# Patient Record
Sex: Female | Born: 1940 | Race: White | Hispanic: No | Smoking: Never smoker
Health system: Southern US, Community
[De-identification: ages and names within clinical notes are randomized; demographics above are authoritative.]

## PROBLEM LIST (undated history)

## (undated) DIAGNOSIS — G459 Transient cerebral ischemic attack, unspecified: Secondary | ICD-10-CM

## (undated) DIAGNOSIS — D649 Anemia, unspecified: Secondary | ICD-10-CM

## (undated) DIAGNOSIS — K219 Gastro-esophageal reflux disease without esophagitis: Secondary | ICD-10-CM

## (undated) DIAGNOSIS — C801 Malignant (primary) neoplasm, unspecified: Secondary | ICD-10-CM

## (undated) DIAGNOSIS — D509 Iron deficiency anemia, unspecified: Secondary | ICD-10-CM

## (undated) DIAGNOSIS — R12 Heartburn: Secondary | ICD-10-CM

## (undated) DIAGNOSIS — N200 Calculus of kidney: Secondary | ICD-10-CM

## (undated) DIAGNOSIS — K921 Melena: Secondary | ICD-10-CM

## (undated) DIAGNOSIS — Z09 Encounter for follow-up examination after completed treatment for conditions other than malignant neoplasm: Secondary | ICD-10-CM

## (undated) DIAGNOSIS — K819 Cholecystitis, unspecified: Secondary | ICD-10-CM

## (undated) HISTORY — DX: Gastro-esophageal reflux disease without esophagitis: K21.9

## (undated) HISTORY — DX: Encounter for follow-up examination after completed treatment for conditions other than malignant neoplasm: Z09

## (undated) HISTORY — PX: CATARACT EXTRACTION: SUR2

## (undated) HISTORY — DX: Melena: K92.1

## (undated) HISTORY — DX: Cholecystitis, unspecified: K81.9

## (undated) HISTORY — DX: Heartburn: R12

## (undated) HISTORY — DX: Transient cerebral ischemic attack, unspecified: G45.9

## (undated) HISTORY — PX: CHOLECYSTECTOMY: SHX55

## (undated) HISTORY — DX: Malignant (primary) neoplasm, unspecified: C80.1

## (undated) HISTORY — DX: Anemia, unspecified: D64.9

## (undated) HISTORY — DX: Calculus of kidney: N20.0

## (undated) HISTORY — DX: Iron deficiency anemia, unspecified: D50.9

---

## 2009-03-01 HISTORY — PX: SPHINCTEROTOMY: SHX5279

## 2014-12-19 DIAGNOSIS — N200 Calculus of kidney: Secondary | ICD-10-CM | POA: Diagnosis not present

## 2014-12-19 DIAGNOSIS — R1012 Left upper quadrant pain: Secondary | ICD-10-CM | POA: Diagnosis not present

## 2014-12-19 DIAGNOSIS — N3090 Cystitis, unspecified without hematuria: Secondary | ICD-10-CM | POA: Diagnosis not present

## 2014-12-19 DIAGNOSIS — N201 Calculus of ureter: Secondary | ICD-10-CM | POA: Diagnosis not present

## 2015-03-02 HISTORY — PX: HEMICOLECTOMY: SHX854

## 2015-05-08 DIAGNOSIS — J208 Acute bronchitis due to other specified organisms: Secondary | ICD-10-CM | POA: Diagnosis not present

## 2015-05-08 DIAGNOSIS — R0602 Shortness of breath: Secondary | ICD-10-CM | POA: Diagnosis not present

## 2015-05-08 DIAGNOSIS — R05 Cough: Secondary | ICD-10-CM | POA: Diagnosis not present

## 2015-05-08 DIAGNOSIS — J9811 Atelectasis: Secondary | ICD-10-CM | POA: Diagnosis not present

## 2015-05-29 DIAGNOSIS — R05 Cough: Secondary | ICD-10-CM | POA: Diagnosis not present

## 2015-05-29 DIAGNOSIS — R5383 Other fatigue: Secondary | ICD-10-CM | POA: Diagnosis not present

## 2015-05-29 DIAGNOSIS — K219 Gastro-esophageal reflux disease without esophagitis: Secondary | ICD-10-CM | POA: Diagnosis not present

## 2015-05-29 DIAGNOSIS — D649 Anemia, unspecified: Secondary | ICD-10-CM | POA: Diagnosis not present

## 2015-05-30 DIAGNOSIS — D649 Anemia, unspecified: Secondary | ICD-10-CM | POA: Diagnosis not present

## 2015-06-09 DIAGNOSIS — J208 Acute bronchitis due to other specified organisms: Secondary | ICD-10-CM | POA: Diagnosis not present

## 2015-06-19 DIAGNOSIS — N201 Calculus of ureter: Secondary | ICD-10-CM | POA: Diagnosis not present

## 2015-06-19 DIAGNOSIS — N3090 Cystitis, unspecified without hematuria: Secondary | ICD-10-CM | POA: Diagnosis not present

## 2015-06-24 DIAGNOSIS — R69 Illness, unspecified: Secondary | ICD-10-CM | POA: Diagnosis not present

## 2015-06-27 DIAGNOSIS — R05 Cough: Secondary | ICD-10-CM | POA: Diagnosis not present

## 2015-06-27 DIAGNOSIS — D508 Other iron deficiency anemias: Secondary | ICD-10-CM | POA: Diagnosis not present

## 2015-06-27 DIAGNOSIS — R5383 Other fatigue: Secondary | ICD-10-CM | POA: Diagnosis not present

## 2015-07-08 DIAGNOSIS — K921 Melena: Secondary | ICD-10-CM | POA: Diagnosis not present

## 2015-07-08 DIAGNOSIS — D509 Iron deficiency anemia, unspecified: Secondary | ICD-10-CM | POA: Diagnosis not present

## 2015-07-08 DIAGNOSIS — D539 Nutritional anemia, unspecified: Secondary | ICD-10-CM

## 2015-07-08 DIAGNOSIS — D649 Anemia, unspecified: Secondary | ICD-10-CM

## 2015-07-08 DIAGNOSIS — R12 Heartburn: Secondary | ICD-10-CM | POA: Diagnosis not present

## 2015-07-08 HISTORY — DX: Nutritional anemia, unspecified: D53.9

## 2015-07-08 HISTORY — DX: Heartburn: R12

## 2015-07-08 HISTORY — DX: Melena: K92.1

## 2015-07-08 HISTORY — DX: Anemia, unspecified: D64.9

## 2015-07-17 DIAGNOSIS — C184 Malignant neoplasm of transverse colon: Secondary | ICD-10-CM | POA: Diagnosis not present

## 2015-07-17 DIAGNOSIS — K921 Melena: Secondary | ICD-10-CM | POA: Diagnosis not present

## 2015-07-17 DIAGNOSIS — R12 Heartburn: Secondary | ICD-10-CM | POA: Diagnosis not present

## 2015-07-17 DIAGNOSIS — D509 Iron deficiency anemia, unspecified: Secondary | ICD-10-CM | POA: Diagnosis not present

## 2015-07-17 DIAGNOSIS — K5730 Diverticulosis of large intestine without perforation or abscess without bleeding: Secondary | ICD-10-CM | POA: Diagnosis not present

## 2015-07-18 DIAGNOSIS — Z85038 Personal history of other malignant neoplasm of large intestine: Secondary | ICD-10-CM

## 2015-07-18 HISTORY — DX: Personal history of other malignant neoplasm of large intestine: Z85.038

## 2015-07-22 DIAGNOSIS — C189 Malignant neoplasm of colon, unspecified: Secondary | ICD-10-CM | POA: Diagnosis not present

## 2015-07-22 DIAGNOSIS — C184 Malignant neoplasm of transverse colon: Secondary | ICD-10-CM | POA: Insufficient documentation

## 2015-07-22 HISTORY — DX: Malignant neoplasm of transverse colon: C18.4

## 2015-08-07 DIAGNOSIS — R911 Solitary pulmonary nodule: Secondary | ICD-10-CM | POA: Diagnosis not present

## 2015-08-07 DIAGNOSIS — K8080 Other cholelithiasis without obstruction: Secondary | ICD-10-CM | POA: Diagnosis not present

## 2015-08-07 DIAGNOSIS — K5730 Diverticulosis of large intestine without perforation or abscess without bleeding: Secondary | ICD-10-CM | POA: Diagnosis not present

## 2015-08-07 DIAGNOSIS — C189 Malignant neoplasm of colon, unspecified: Secondary | ICD-10-CM | POA: Diagnosis not present

## 2015-08-08 DIAGNOSIS — K819 Cholecystitis, unspecified: Secondary | ICD-10-CM | POA: Insufficient documentation

## 2015-08-08 HISTORY — DX: Cholecystitis, unspecified: K81.9

## 2015-08-18 DIAGNOSIS — S41131A Puncture wound without foreign body of right upper arm, initial encounter: Secondary | ICD-10-CM | POA: Diagnosis not present

## 2015-08-18 DIAGNOSIS — S41132A Puncture wound without foreign body of left upper arm, initial encounter: Secondary | ICD-10-CM | POA: Diagnosis not present

## 2015-08-19 DIAGNOSIS — K819 Cholecystitis, unspecified: Secondary | ICD-10-CM | POA: Diagnosis not present

## 2015-08-19 DIAGNOSIS — C184 Malignant neoplasm of transverse colon: Secondary | ICD-10-CM | POA: Diagnosis not present

## 2015-08-19 DIAGNOSIS — Z01818 Encounter for other preprocedural examination: Secondary | ICD-10-CM | POA: Diagnosis not present

## 2015-08-20 DIAGNOSIS — C184 Malignant neoplasm of transverse colon: Secondary | ICD-10-CM | POA: Diagnosis not present

## 2015-08-20 DIAGNOSIS — C183 Malignant neoplasm of hepatic flexure: Secondary | ICD-10-CM | POA: Diagnosis not present

## 2015-08-20 DIAGNOSIS — K8010 Calculus of gallbladder with chronic cholecystitis without obstruction: Secondary | ICD-10-CM | POA: Diagnosis not present

## 2015-08-20 DIAGNOSIS — R339 Retention of urine, unspecified: Secondary | ICD-10-CM | POA: Diagnosis not present

## 2015-08-20 DIAGNOSIS — K219 Gastro-esophageal reflux disease without esophagitis: Secondary | ICD-10-CM | POA: Diagnosis not present

## 2015-09-01 DIAGNOSIS — Z09 Encounter for follow-up examination after completed treatment for conditions other than malignant neoplasm: Secondary | ICD-10-CM

## 2015-09-01 HISTORY — DX: Encounter for follow-up examination after completed treatment for conditions other than malignant neoplasm: Z09

## 2015-09-03 DIAGNOSIS — D649 Anemia, unspecified: Secondary | ICD-10-CM | POA: Diagnosis not present

## 2015-09-05 DIAGNOSIS — C187 Malignant neoplasm of sigmoid colon: Secondary | ICD-10-CM | POA: Diagnosis not present

## 2015-09-05 DIAGNOSIS — D508 Other iron deficiency anemias: Secondary | ICD-10-CM | POA: Diagnosis not present

## 2015-09-05 DIAGNOSIS — R498 Other voice and resonance disorders: Secondary | ICD-10-CM | POA: Diagnosis not present

## 2015-09-09 DIAGNOSIS — R978 Other abnormal tumor markers: Secondary | ICD-10-CM | POA: Diagnosis not present

## 2015-09-09 DIAGNOSIS — D649 Anemia, unspecified: Secondary | ICD-10-CM | POA: Diagnosis not present

## 2015-09-09 DIAGNOSIS — E538 Deficiency of other specified B group vitamins: Secondary | ICD-10-CM | POA: Diagnosis not present

## 2015-09-09 DIAGNOSIS — C184 Malignant neoplasm of transverse colon: Secondary | ICD-10-CM | POA: Diagnosis not present

## 2015-09-24 DIAGNOSIS — K1379 Other lesions of oral mucosa: Secondary | ICD-10-CM | POA: Diagnosis not present

## 2015-10-01 DIAGNOSIS — D508 Other iron deficiency anemias: Secondary | ICD-10-CM | POA: Diagnosis not present

## 2015-10-29 DIAGNOSIS — D649 Anemia, unspecified: Secondary | ICD-10-CM | POA: Diagnosis not present

## 2015-11-10 DIAGNOSIS — H52221 Regular astigmatism, right eye: Secondary | ICD-10-CM | POA: Diagnosis not present

## 2015-11-10 DIAGNOSIS — Z961 Presence of intraocular lens: Secondary | ICD-10-CM | POA: Diagnosis not present

## 2015-11-10 DIAGNOSIS — H43813 Vitreous degeneration, bilateral: Secondary | ICD-10-CM | POA: Diagnosis not present

## 2015-11-10 DIAGNOSIS — D3131 Benign neoplasm of right choroid: Secondary | ICD-10-CM | POA: Diagnosis not present

## 2015-11-10 DIAGNOSIS — H43393 Other vitreous opacities, bilateral: Secondary | ICD-10-CM | POA: Diagnosis not present

## 2015-11-10 DIAGNOSIS — H524 Presbyopia: Secondary | ICD-10-CM | POA: Diagnosis not present

## 2015-11-10 DIAGNOSIS — Z9842 Cataract extraction status, left eye: Secondary | ICD-10-CM | POA: Diagnosis not present

## 2015-11-10 DIAGNOSIS — Z9841 Cataract extraction status, right eye: Secondary | ICD-10-CM | POA: Diagnosis not present

## 2015-11-10 DIAGNOSIS — H5201 Hypermetropia, right eye: Secondary | ICD-10-CM | POA: Diagnosis not present

## 2015-11-10 DIAGNOSIS — H35373 Puckering of macula, bilateral: Secondary | ICD-10-CM | POA: Diagnosis not present

## 2015-12-08 DIAGNOSIS — R7301 Impaired fasting glucose: Secondary | ICD-10-CM | POA: Diagnosis not present

## 2015-12-08 DIAGNOSIS — Z1231 Encounter for screening mammogram for malignant neoplasm of breast: Secondary | ICD-10-CM | POA: Diagnosis not present

## 2015-12-08 DIAGNOSIS — C184 Malignant neoplasm of transverse colon: Secondary | ICD-10-CM | POA: Diagnosis not present

## 2015-12-08 DIAGNOSIS — N952 Postmenopausal atrophic vaginitis: Secondary | ICD-10-CM | POA: Diagnosis not present

## 2015-12-08 DIAGNOSIS — D649 Anemia, unspecified: Secondary | ICD-10-CM | POA: Diagnosis not present

## 2015-12-08 DIAGNOSIS — Z0000 Encounter for general adult medical examination without abnormal findings: Secondary | ICD-10-CM | POA: Diagnosis not present

## 2015-12-08 DIAGNOSIS — Z13820 Encounter for screening for osteoporosis: Secondary | ICD-10-CM | POA: Diagnosis not present

## 2015-12-08 DIAGNOSIS — Z6825 Body mass index (BMI) 25.0-25.9, adult: Secondary | ICD-10-CM | POA: Diagnosis not present

## 2015-12-08 DIAGNOSIS — E782 Mixed hyperlipidemia: Secondary | ICD-10-CM | POA: Diagnosis not present

## 2015-12-08 DIAGNOSIS — L2089 Other atopic dermatitis: Secondary | ICD-10-CM | POA: Diagnosis not present

## 2015-12-09 DIAGNOSIS — Z85038 Personal history of other malignant neoplasm of large intestine: Secondary | ICD-10-CM | POA: Diagnosis not present

## 2015-12-09 DIAGNOSIS — N189 Chronic kidney disease, unspecified: Secondary | ICD-10-CM | POA: Diagnosis not present

## 2015-12-09 DIAGNOSIS — D509 Iron deficiency anemia, unspecified: Secondary | ICD-10-CM | POA: Diagnosis not present

## 2015-12-15 DIAGNOSIS — R970 Elevated carcinoembryonic antigen [CEA]: Secondary | ICD-10-CM | POA: Diagnosis not present

## 2015-12-15 DIAGNOSIS — C189 Malignant neoplasm of colon, unspecified: Secondary | ICD-10-CM | POA: Diagnosis not present

## 2015-12-15 DIAGNOSIS — D500 Iron deficiency anemia secondary to blood loss (chronic): Secondary | ICD-10-CM | POA: Diagnosis not present

## 2015-12-15 DIAGNOSIS — C184 Malignant neoplasm of transverse colon: Secondary | ICD-10-CM | POA: Diagnosis not present

## 2015-12-18 DIAGNOSIS — N959 Unspecified menopausal and perimenopausal disorder: Secondary | ICD-10-CM | POA: Diagnosis not present

## 2015-12-18 DIAGNOSIS — Z13820 Encounter for screening for osteoporosis: Secondary | ICD-10-CM | POA: Diagnosis not present

## 2015-12-18 DIAGNOSIS — Z1231 Encounter for screening mammogram for malignant neoplasm of breast: Secondary | ICD-10-CM | POA: Diagnosis not present

## 2015-12-19 DIAGNOSIS — C184 Malignant neoplasm of transverse colon: Secondary | ICD-10-CM | POA: Diagnosis not present

## 2015-12-19 DIAGNOSIS — R970 Elevated carcinoembryonic antigen [CEA]: Secondary | ICD-10-CM

## 2016-02-05 DIAGNOSIS — M791 Myalgia: Secondary | ICD-10-CM | POA: Diagnosis not present

## 2016-02-05 DIAGNOSIS — R002 Palpitations: Secondary | ICD-10-CM | POA: Diagnosis not present

## 2016-02-05 DIAGNOSIS — M790 Rheumatism, unspecified: Secondary | ICD-10-CM | POA: Diagnosis not present

## 2016-02-13 DIAGNOSIS — C184 Malignant neoplasm of transverse colon: Secondary | ICD-10-CM | POA: Diagnosis not present

## 2016-02-13 DIAGNOSIS — D500 Iron deficiency anemia secondary to blood loss (chronic): Secondary | ICD-10-CM | POA: Diagnosis not present

## 2016-02-13 DIAGNOSIS — R978 Other abnormal tumor markers: Secondary | ICD-10-CM | POA: Diagnosis not present

## 2016-02-16 DIAGNOSIS — D509 Iron deficiency anemia, unspecified: Secondary | ICD-10-CM | POA: Diagnosis not present

## 2016-02-16 DIAGNOSIS — C184 Malignant neoplasm of transverse colon: Secondary | ICD-10-CM | POA: Diagnosis not present

## 2016-02-25 DIAGNOSIS — R002 Palpitations: Secondary | ICD-10-CM | POA: Diagnosis not present

## 2016-02-27 DIAGNOSIS — R002 Palpitations: Secondary | ICD-10-CM | POA: Diagnosis not present

## 2016-05-17 DIAGNOSIS — D649 Anemia, unspecified: Secondary | ICD-10-CM | POA: Diagnosis not present

## 2016-05-17 DIAGNOSIS — N189 Chronic kidney disease, unspecified: Secondary | ICD-10-CM | POA: Diagnosis not present

## 2016-05-17 DIAGNOSIS — C184 Malignant neoplasm of transverse colon: Secondary | ICD-10-CM | POA: Diagnosis not present

## 2016-06-28 DIAGNOSIS — R69 Illness, unspecified: Secondary | ICD-10-CM | POA: Diagnosis not present

## 2016-08-16 DIAGNOSIS — R970 Elevated carcinoembryonic antigen [CEA]: Secondary | ICD-10-CM | POA: Diagnosis not present

## 2016-08-16 DIAGNOSIS — C184 Malignant neoplasm of transverse colon: Secondary | ICD-10-CM | POA: Diagnosis not present

## 2016-08-16 DIAGNOSIS — Z85030 Personal history of malignant carcinoid tumor of large intestine: Secondary | ICD-10-CM | POA: Diagnosis not present

## 2016-08-30 DIAGNOSIS — C184 Malignant neoplasm of transverse colon: Secondary | ICD-10-CM | POA: Diagnosis not present

## 2016-09-30 DIAGNOSIS — Z9049 Acquired absence of other specified parts of digestive tract: Secondary | ICD-10-CM | POA: Diagnosis not present

## 2016-09-30 DIAGNOSIS — D649 Anemia, unspecified: Secondary | ICD-10-CM | POA: Diagnosis not present

## 2016-09-30 DIAGNOSIS — C184 Malignant neoplasm of transverse colon: Secondary | ICD-10-CM | POA: Diagnosis not present

## 2016-09-30 DIAGNOSIS — R195 Other fecal abnormalities: Secondary | ICD-10-CM | POA: Diagnosis not present

## 2016-09-30 DIAGNOSIS — Z85038 Personal history of other malignant neoplasm of large intestine: Secondary | ICD-10-CM | POA: Diagnosis not present

## 2016-09-30 DIAGNOSIS — K5730 Diverticulosis of large intestine without perforation or abscess without bleeding: Secondary | ICD-10-CM | POA: Diagnosis not present

## 2016-09-30 DIAGNOSIS — Z79899 Other long term (current) drug therapy: Secondary | ICD-10-CM | POA: Diagnosis not present

## 2016-09-30 DIAGNOSIS — K5909 Other constipation: Secondary | ICD-10-CM | POA: Diagnosis not present

## 2016-10-29 DIAGNOSIS — M722 Plantar fascial fibromatosis: Secondary | ICD-10-CM | POA: Diagnosis not present

## 2016-11-22 DIAGNOSIS — Z85038 Personal history of other malignant neoplasm of large intestine: Secondary | ICD-10-CM | POA: Diagnosis not present

## 2016-11-22 DIAGNOSIS — M722 Plantar fascial fibromatosis: Secondary | ICD-10-CM | POA: Diagnosis not present

## 2016-11-22 DIAGNOSIS — D508 Other iron deficiency anemias: Secondary | ICD-10-CM | POA: Diagnosis not present

## 2016-12-08 DIAGNOSIS — Z0000 Encounter for general adult medical examination without abnormal findings: Secondary | ICD-10-CM | POA: Diagnosis not present

## 2016-12-08 DIAGNOSIS — Z6827 Body mass index (BMI) 27.0-27.9, adult: Secondary | ICD-10-CM | POA: Diagnosis not present

## 2016-12-13 DIAGNOSIS — D508 Other iron deficiency anemias: Secondary | ICD-10-CM | POA: Diagnosis not present

## 2016-12-13 DIAGNOSIS — E782 Mixed hyperlipidemia: Secondary | ICD-10-CM | POA: Diagnosis not present

## 2016-12-16 DIAGNOSIS — C184 Malignant neoplasm of transverse colon: Secondary | ICD-10-CM | POA: Diagnosis not present

## 2016-12-16 DIAGNOSIS — D649 Anemia, unspecified: Secondary | ICD-10-CM | POA: Diagnosis not present

## 2016-12-20 DIAGNOSIS — D500 Iron deficiency anemia secondary to blood loss (chronic): Secondary | ICD-10-CM | POA: Diagnosis not present

## 2016-12-20 DIAGNOSIS — Z85038 Personal history of other malignant neoplasm of large intestine: Secondary | ICD-10-CM | POA: Diagnosis not present

## 2016-12-27 DIAGNOSIS — Z85038 Personal history of other malignant neoplasm of large intestine: Secondary | ICD-10-CM | POA: Diagnosis not present

## 2016-12-27 DIAGNOSIS — D500 Iron deficiency anemia secondary to blood loss (chronic): Secondary | ICD-10-CM | POA: Diagnosis not present

## 2016-12-29 DIAGNOSIS — R69 Illness, unspecified: Secondary | ICD-10-CM | POA: Diagnosis not present

## 2017-01-03 DIAGNOSIS — D500 Iron deficiency anemia secondary to blood loss (chronic): Secondary | ICD-10-CM | POA: Diagnosis not present

## 2017-01-03 DIAGNOSIS — Z85038 Personal history of other malignant neoplasm of large intestine: Secondary | ICD-10-CM | POA: Diagnosis not present

## 2017-01-10 DIAGNOSIS — Z85038 Personal history of other malignant neoplasm of large intestine: Secondary | ICD-10-CM | POA: Diagnosis not present

## 2017-01-10 DIAGNOSIS — D500 Iron deficiency anemia secondary to blood loss (chronic): Secondary | ICD-10-CM | POA: Diagnosis not present

## 2017-01-10 DIAGNOSIS — C184 Malignant neoplasm of transverse colon: Secondary | ICD-10-CM | POA: Diagnosis not present

## 2017-02-01 DIAGNOSIS — Z1231 Encounter for screening mammogram for malignant neoplasm of breast: Secondary | ICD-10-CM | POA: Diagnosis not present

## 2017-02-14 DIAGNOSIS — D500 Iron deficiency anemia secondary to blood loss (chronic): Secondary | ICD-10-CM | POA: Diagnosis not present

## 2017-03-14 DIAGNOSIS — Z85038 Personal history of other malignant neoplasm of large intestine: Secondary | ICD-10-CM | POA: Diagnosis not present

## 2017-03-14 DIAGNOSIS — D500 Iron deficiency anemia secondary to blood loss (chronic): Secondary | ICD-10-CM | POA: Diagnosis not present

## 2017-03-14 DIAGNOSIS — D509 Iron deficiency anemia, unspecified: Secondary | ICD-10-CM | POA: Diagnosis not present

## 2017-03-17 DIAGNOSIS — H35373 Puckering of macula, bilateral: Secondary | ICD-10-CM | POA: Diagnosis not present

## 2017-03-17 DIAGNOSIS — H43813 Vitreous degeneration, bilateral: Secondary | ICD-10-CM | POA: Diagnosis not present

## 2017-03-17 DIAGNOSIS — H43393 Other vitreous opacities, bilateral: Secondary | ICD-10-CM | POA: Diagnosis not present

## 2017-03-17 DIAGNOSIS — H524 Presbyopia: Secondary | ICD-10-CM | POA: Diagnosis not present

## 2017-03-17 DIAGNOSIS — Z9841 Cataract extraction status, right eye: Secondary | ICD-10-CM | POA: Diagnosis not present

## 2017-03-17 DIAGNOSIS — Z9842 Cataract extraction status, left eye: Secondary | ICD-10-CM | POA: Diagnosis not present

## 2017-03-17 DIAGNOSIS — Z961 Presence of intraocular lens: Secondary | ICD-10-CM | POA: Diagnosis not present

## 2017-03-17 DIAGNOSIS — H52222 Regular astigmatism, left eye: Secondary | ICD-10-CM | POA: Diagnosis not present

## 2017-03-17 DIAGNOSIS — D3131 Benign neoplasm of right choroid: Secondary | ICD-10-CM | POA: Diagnosis not present

## 2017-04-18 DIAGNOSIS — E538 Deficiency of other specified B group vitamins: Secondary | ICD-10-CM | POA: Diagnosis not present

## 2017-04-18 DIAGNOSIS — Z85038 Personal history of other malignant neoplasm of large intestine: Secondary | ICD-10-CM | POA: Diagnosis not present

## 2017-05-16 DIAGNOSIS — Z85038 Personal history of other malignant neoplasm of large intestine: Secondary | ICD-10-CM | POA: Diagnosis not present

## 2017-05-16 DIAGNOSIS — E538 Deficiency of other specified B group vitamins: Secondary | ICD-10-CM | POA: Diagnosis not present

## 2017-06-13 DIAGNOSIS — Z85038 Personal history of other malignant neoplasm of large intestine: Secondary | ICD-10-CM | POA: Diagnosis not present

## 2017-06-13 DIAGNOSIS — D500 Iron deficiency anemia secondary to blood loss (chronic): Secondary | ICD-10-CM | POA: Diagnosis not present

## 2017-06-28 DIAGNOSIS — R69 Illness, unspecified: Secondary | ICD-10-CM | POA: Diagnosis not present

## 2017-06-29 DIAGNOSIS — R69 Illness, unspecified: Secondary | ICD-10-CM | POA: Diagnosis not present

## 2017-07-11 DIAGNOSIS — Z85038 Personal history of other malignant neoplasm of large intestine: Secondary | ICD-10-CM | POA: Diagnosis not present

## 2017-07-11 DIAGNOSIS — D509 Iron deficiency anemia, unspecified: Secondary | ICD-10-CM | POA: Diagnosis not present

## 2017-07-11 DIAGNOSIS — D500 Iron deficiency anemia secondary to blood loss (chronic): Secondary | ICD-10-CM | POA: Diagnosis not present

## 2017-08-08 DIAGNOSIS — E538 Deficiency of other specified B group vitamins: Secondary | ICD-10-CM | POA: Diagnosis not present

## 2017-08-08 DIAGNOSIS — Z85038 Personal history of other malignant neoplasm of large intestine: Secondary | ICD-10-CM | POA: Diagnosis not present

## 2017-08-09 DIAGNOSIS — D509 Iron deficiency anemia, unspecified: Secondary | ICD-10-CM | POA: Diagnosis not present

## 2017-08-09 DIAGNOSIS — Z85038 Personal history of other malignant neoplasm of large intestine: Secondary | ICD-10-CM | POA: Diagnosis not present

## 2017-08-22 DIAGNOSIS — R42 Dizziness and giddiness: Secondary | ICD-10-CM | POA: Diagnosis not present

## 2017-09-05 DIAGNOSIS — D509 Iron deficiency anemia, unspecified: Secondary | ICD-10-CM | POA: Diagnosis not present

## 2017-09-05 DIAGNOSIS — Z85038 Personal history of other malignant neoplasm of large intestine: Secondary | ICD-10-CM | POA: Diagnosis not present

## 2017-09-07 DIAGNOSIS — I10 Essential (primary) hypertension: Secondary | ICD-10-CM | POA: Diagnosis not present

## 2017-09-07 DIAGNOSIS — R42 Dizziness and giddiness: Secondary | ICD-10-CM | POA: Diagnosis not present

## 2017-09-30 DIAGNOSIS — R69 Illness, unspecified: Secondary | ICD-10-CM | POA: Diagnosis not present

## 2017-09-30 DIAGNOSIS — I10 Essential (primary) hypertension: Secondary | ICD-10-CM | POA: Diagnosis not present

## 2017-09-30 DIAGNOSIS — R944 Abnormal results of kidney function studies: Secondary | ICD-10-CM | POA: Diagnosis not present

## 2017-10-06 DIAGNOSIS — D500 Iron deficiency anemia secondary to blood loss (chronic): Secondary | ICD-10-CM | POA: Diagnosis not present

## 2017-10-14 DIAGNOSIS — R42 Dizziness and giddiness: Secondary | ICD-10-CM | POA: Diagnosis not present

## 2017-10-14 DIAGNOSIS — J342 Deviated nasal septum: Secondary | ICD-10-CM | POA: Diagnosis not present

## 2017-10-14 DIAGNOSIS — R030 Elevated blood-pressure reading, without diagnosis of hypertension: Secondary | ICD-10-CM | POA: Diagnosis not present

## 2017-11-01 DIAGNOSIS — R69 Illness, unspecified: Secondary | ICD-10-CM | POA: Diagnosis not present

## 2017-11-01 DIAGNOSIS — I10 Essential (primary) hypertension: Secondary | ICD-10-CM | POA: Diagnosis not present

## 2017-11-01 DIAGNOSIS — D500 Iron deficiency anemia secondary to blood loss (chronic): Secondary | ICD-10-CM | POA: Diagnosis not present

## 2017-11-21 DIAGNOSIS — R42 Dizziness and giddiness: Secondary | ICD-10-CM | POA: Diagnosis not present

## 2017-11-21 DIAGNOSIS — R69 Illness, unspecified: Secondary | ICD-10-CM | POA: Diagnosis not present

## 2017-11-21 DIAGNOSIS — I10 Essential (primary) hypertension: Secondary | ICD-10-CM | POA: Diagnosis not present

## 2017-11-29 DIAGNOSIS — Z85038 Personal history of other malignant neoplasm of large intestine: Secondary | ICD-10-CM | POA: Diagnosis not present

## 2017-11-29 DIAGNOSIS — D500 Iron deficiency anemia secondary to blood loss (chronic): Secondary | ICD-10-CM | POA: Diagnosis not present

## 2017-12-12 DIAGNOSIS — I10 Essential (primary) hypertension: Secondary | ICD-10-CM | POA: Diagnosis not present

## 2017-12-12 DIAGNOSIS — E663 Overweight: Secondary | ICD-10-CM | POA: Diagnosis not present

## 2017-12-12 DIAGNOSIS — R202 Paresthesia of skin: Secondary | ICD-10-CM | POA: Diagnosis not present

## 2017-12-12 DIAGNOSIS — Z6827 Body mass index (BMI) 27.0-27.9, adult: Secondary | ICD-10-CM | POA: Diagnosis not present

## 2017-12-12 DIAGNOSIS — Z0001 Encounter for general adult medical examination with abnormal findings: Secondary | ICD-10-CM | POA: Diagnosis not present

## 2017-12-12 DIAGNOSIS — R2681 Unsteadiness on feet: Secondary | ICD-10-CM | POA: Diagnosis not present

## 2017-12-21 DIAGNOSIS — R2681 Unsteadiness on feet: Secondary | ICD-10-CM | POA: Diagnosis not present

## 2017-12-21 DIAGNOSIS — R2689 Other abnormalities of gait and mobility: Secondary | ICD-10-CM | POA: Diagnosis not present

## 2017-12-21 DIAGNOSIS — M6281 Muscle weakness (generalized): Secondary | ICD-10-CM | POA: Diagnosis not present

## 2017-12-26 DIAGNOSIS — R2681 Unsteadiness on feet: Secondary | ICD-10-CM | POA: Diagnosis not present

## 2017-12-26 DIAGNOSIS — R2689 Other abnormalities of gait and mobility: Secondary | ICD-10-CM | POA: Diagnosis not present

## 2017-12-26 DIAGNOSIS — M6281 Muscle weakness (generalized): Secondary | ICD-10-CM | POA: Diagnosis not present

## 2017-12-27 DIAGNOSIS — D509 Iron deficiency anemia, unspecified: Secondary | ICD-10-CM | POA: Diagnosis not present

## 2017-12-27 DIAGNOSIS — Z85038 Personal history of other malignant neoplasm of large intestine: Secondary | ICD-10-CM | POA: Diagnosis not present

## 2017-12-28 DIAGNOSIS — R2689 Other abnormalities of gait and mobility: Secondary | ICD-10-CM | POA: Diagnosis not present

## 2017-12-28 DIAGNOSIS — R2681 Unsteadiness on feet: Secondary | ICD-10-CM | POA: Diagnosis not present

## 2017-12-28 DIAGNOSIS — M6281 Muscle weakness (generalized): Secondary | ICD-10-CM | POA: Diagnosis not present

## 2018-01-02 DIAGNOSIS — M6281 Muscle weakness (generalized): Secondary | ICD-10-CM | POA: Diagnosis not present

## 2018-01-02 DIAGNOSIS — R2681 Unsteadiness on feet: Secondary | ICD-10-CM | POA: Diagnosis not present

## 2018-01-02 DIAGNOSIS — R2689 Other abnormalities of gait and mobility: Secondary | ICD-10-CM | POA: Diagnosis not present

## 2018-01-05 DIAGNOSIS — R2689 Other abnormalities of gait and mobility: Secondary | ICD-10-CM | POA: Diagnosis not present

## 2018-01-05 DIAGNOSIS — M6281 Muscle weakness (generalized): Secondary | ICD-10-CM | POA: Diagnosis not present

## 2018-01-05 DIAGNOSIS — R2681 Unsteadiness on feet: Secondary | ICD-10-CM | POA: Diagnosis not present

## 2018-01-09 DIAGNOSIS — R2689 Other abnormalities of gait and mobility: Secondary | ICD-10-CM | POA: Diagnosis not present

## 2018-01-09 DIAGNOSIS — M6281 Muscle weakness (generalized): Secondary | ICD-10-CM | POA: Diagnosis not present

## 2018-01-09 DIAGNOSIS — R2681 Unsteadiness on feet: Secondary | ICD-10-CM | POA: Diagnosis not present

## 2018-01-11 DIAGNOSIS — R2681 Unsteadiness on feet: Secondary | ICD-10-CM | POA: Diagnosis not present

## 2018-01-11 DIAGNOSIS — R2689 Other abnormalities of gait and mobility: Secondary | ICD-10-CM | POA: Diagnosis not present

## 2018-01-11 DIAGNOSIS — M6281 Muscle weakness (generalized): Secondary | ICD-10-CM | POA: Diagnosis not present

## 2018-01-17 ENCOUNTER — Ambulatory Visit: Payer: Medicare HMO | Admitting: Neurology

## 2018-01-17 ENCOUNTER — Encounter: Payer: Self-pay | Admitting: Neurology

## 2018-01-17 ENCOUNTER — Ambulatory Visit (INDEPENDENT_AMBULATORY_CARE_PROVIDER_SITE_OTHER): Payer: Medicare HMO | Admitting: Neurology

## 2018-01-17 DIAGNOSIS — R202 Paresthesia of skin: Secondary | ICD-10-CM

## 2018-01-17 NOTE — Procedures (Signed)
      HISTORY:  Rebecca Orr is a 77 year old patient with onset of paresthesias in the distal portions of the feet bilaterally.  The patient initially had onset of symptoms when she went on amlodipine, but the symptoms have persisted.  She is being evaluated for a possible neuropathy or a lumbosacral radiculopathy.  NERVE CONDUCTION STUDIES:  Nerve conduction studies were performed on the right lower extremity. The distal motor latencies and motor amplitudes for the peroneal and posterior tibial nerves were within normal limits. The nerve conduction velocities for these nerves were also normal. The sensory latencies for the peroneal and sural nerves were within normal limits. The F wave latency for the posterior tibial nerve was within normal limits.   EMG STUDIES:  EMG study was performed on the right lower extremity:  The tibialis anterior muscle reveals 2 to 4K motor units with full recruitment. No fibrillations or positive waves were seen. The peroneus tertius muscle reveals 2 to 4K motor units with full recruitment. No fibrillations or positive waves were seen. The medial gastrocnemius muscle reveals 1 to 3K motor units with full recruitment. No fibrillations or positive waves were seen. The vastus lateralis muscle reveals 2 to 4K motor units with full recruitment. No fibrillations or positive waves were seen. The iliopsoas muscle reveals 2 to 4K motor units with full recruitment. No fibrillations or positive waves were seen. The biceps femoris muscle (long head) reveals 2 to 4K motor units with full recruitment. No fibrillations or positive waves were seen. The lumbosacral paraspinal muscles were tested at 3 levels, and revealed no abnormalities of insertional activity at all 3 levels tested. There was good relaxation.   IMPRESSION:  Nerve conduction studies done on the right lower extremity were unremarkable, no evidence of a neuropathy was seen.  EMG evaluation of the right lower  extremity was normal, no evidence of an overlying lumbosacral radiculopathy was seen.  An early peripheral neuropathy or a small fiber neuropathy may be missed by standard nerve conduction studies, however. Clinical correlation is required.  Jill Alexanders MD 01/17/2018 1:48 PM  [facility] [address] [place], [place] [address]  Phone [phone] Fax [phone]

## 2018-01-17 NOTE — Progress Notes (Signed)
Please refer to EMG and nerve conduction procedure note.  

## 2018-01-18 DIAGNOSIS — R69 Illness, unspecified: Secondary | ICD-10-CM | POA: Diagnosis not present

## 2018-01-18 NOTE — Progress Notes (Signed)
[place]    Nerve / Sites Muscle Latency Ref. Amplitude Ref. Rel Amp Segments Distance Velocity Ref. Area    ms ms mV mV %  cm m/s m/s mVms  R Peroneal - EDB     Ankle EDB 5.5 ?6.5 4.8 ?2.0 100 Ankle - EDB 9   18.5     Fib head EDB 13.5  4.3  88.7 Fib head - Ankle 36 45 ?44 17.9     Pop fossa EDB 15.8  4.0  94.9 Pop fossa - Fib head 10 45 ?44 16.6         Pop fossa - Ankle      R Tibial - AH     Ankle AH 4.7 ?5.8 10.5 ?4.0 100 Ankle - AH 9   23.0     Pop fossa AH 14.4  8.1  77.3 Pop fossa - Ankle 40 41 ?41 21.7         SNC    Nerve / Sites Rec. Site Peak Lat Ref.  Amp Ref. Segments Distance    ms ms V V  cm  R Sural - Ankle (Calf)     Calf Ankle 3.8 ?4.4 6 ?6 Calf - Ankle 14  R Superficial peroneal - Ankle     Lat leg Ankle 3.7 ?4.4 7 ?6 Lat leg - Ankle 14         F  Wave    Nerve F Lat Ref.   ms ms  R Tibial - AH 54.3 ?56.0

## 2018-01-24 DIAGNOSIS — E538 Deficiency of other specified B group vitamins: Secondary | ICD-10-CM | POA: Diagnosis not present

## 2018-01-24 DIAGNOSIS — Z85038 Personal history of other malignant neoplasm of large intestine: Secondary | ICD-10-CM | POA: Diagnosis not present

## 2018-01-25 DIAGNOSIS — C184 Malignant neoplasm of transverse colon: Secondary | ICD-10-CM | POA: Diagnosis not present

## 2018-01-30 DIAGNOSIS — R42 Dizziness and giddiness: Secondary | ICD-10-CM | POA: Diagnosis not present

## 2018-01-30 DIAGNOSIS — R202 Paresthesia of skin: Secondary | ICD-10-CM | POA: Diagnosis not present

## 2018-01-30 DIAGNOSIS — I129 Hypertensive chronic kidney disease with stage 1 through stage 4 chronic kidney disease, or unspecified chronic kidney disease: Secondary | ICD-10-CM | POA: Diagnosis not present

## 2018-02-04 DIAGNOSIS — R42 Dizziness and giddiness: Secondary | ICD-10-CM | POA: Diagnosis not present

## 2018-02-04 DIAGNOSIS — Z79899 Other long term (current) drug therapy: Secondary | ICD-10-CM | POA: Diagnosis not present

## 2018-02-04 DIAGNOSIS — R110 Nausea: Secondary | ICD-10-CM | POA: Diagnosis not present

## 2018-02-04 DIAGNOSIS — H81399 Other peripheral vertigo, unspecified ear: Secondary | ICD-10-CM | POA: Diagnosis not present

## 2018-02-04 DIAGNOSIS — R29700 NIHSS score 0: Secondary | ICD-10-CM | POA: Diagnosis not present

## 2018-02-07 DIAGNOSIS — Z8673 Personal history of transient ischemic attack (TIA), and cerebral infarction without residual deficits: Secondary | ICD-10-CM | POA: Diagnosis not present

## 2018-02-07 DIAGNOSIS — R42 Dizziness and giddiness: Secondary | ICD-10-CM | POA: Diagnosis not present

## 2018-02-07 DIAGNOSIS — I129 Hypertensive chronic kidney disease with stage 1 through stage 4 chronic kidney disease, or unspecified chronic kidney disease: Secondary | ICD-10-CM | POA: Diagnosis not present

## 2018-02-10 DIAGNOSIS — R42 Dizziness and giddiness: Secondary | ICD-10-CM | POA: Diagnosis not present

## 2018-02-10 DIAGNOSIS — J342 Deviated nasal septum: Secondary | ICD-10-CM | POA: Diagnosis not present

## 2018-02-10 DIAGNOSIS — H903 Sensorineural hearing loss, bilateral: Secondary | ICD-10-CM | POA: Diagnosis not present

## 2018-02-10 DIAGNOSIS — R269 Unspecified abnormalities of gait and mobility: Secondary | ICD-10-CM | POA: Diagnosis not present

## 2018-02-10 DIAGNOSIS — H938X2 Other specified disorders of left ear: Secondary | ICD-10-CM | POA: Diagnosis not present

## 2018-02-14 DIAGNOSIS — R42 Dizziness and giddiness: Secondary | ICD-10-CM | POA: Diagnosis not present

## 2018-02-27 DIAGNOSIS — E538 Deficiency of other specified B group vitamins: Secondary | ICD-10-CM | POA: Diagnosis not present

## 2018-02-27 DIAGNOSIS — Z85038 Personal history of other malignant neoplasm of large intestine: Secondary | ICD-10-CM | POA: Diagnosis not present

## 2018-03-02 DIAGNOSIS — Z1231 Encounter for screening mammogram for malignant neoplasm of breast: Secondary | ICD-10-CM | POA: Diagnosis not present

## 2018-03-06 DIAGNOSIS — R69 Illness, unspecified: Secondary | ICD-10-CM | POA: Diagnosis not present

## 2018-03-06 DIAGNOSIS — M6281 Muscle weakness (generalized): Secondary | ICD-10-CM | POA: Diagnosis not present

## 2018-03-06 DIAGNOSIS — R42 Dizziness and giddiness: Secondary | ICD-10-CM | POA: Diagnosis not present

## 2018-03-06 DIAGNOSIS — R2681 Unsteadiness on feet: Secondary | ICD-10-CM | POA: Diagnosis not present

## 2018-03-08 DIAGNOSIS — M6281 Muscle weakness (generalized): Secondary | ICD-10-CM | POA: Diagnosis not present

## 2018-03-08 DIAGNOSIS — R42 Dizziness and giddiness: Secondary | ICD-10-CM | POA: Diagnosis not present

## 2018-03-08 DIAGNOSIS — R2681 Unsteadiness on feet: Secondary | ICD-10-CM | POA: Diagnosis not present

## 2018-03-09 DIAGNOSIS — I951 Orthostatic hypotension: Secondary | ICD-10-CM | POA: Diagnosis not present

## 2018-03-09 DIAGNOSIS — R42 Dizziness and giddiness: Secondary | ICD-10-CM | POA: Diagnosis not present

## 2018-03-09 DIAGNOSIS — I129 Hypertensive chronic kidney disease with stage 1 through stage 4 chronic kidney disease, or unspecified chronic kidney disease: Secondary | ICD-10-CM | POA: Diagnosis not present

## 2018-03-13 DIAGNOSIS — R2681 Unsteadiness on feet: Secondary | ICD-10-CM | POA: Diagnosis not present

## 2018-03-13 DIAGNOSIS — R42 Dizziness and giddiness: Secondary | ICD-10-CM | POA: Diagnosis not present

## 2018-03-13 DIAGNOSIS — M6281 Muscle weakness (generalized): Secondary | ICD-10-CM | POA: Diagnosis not present

## 2018-03-15 DIAGNOSIS — M6281 Muscle weakness (generalized): Secondary | ICD-10-CM | POA: Diagnosis not present

## 2018-03-15 DIAGNOSIS — R42 Dizziness and giddiness: Secondary | ICD-10-CM | POA: Diagnosis not present

## 2018-03-15 DIAGNOSIS — R2681 Unsteadiness on feet: Secondary | ICD-10-CM | POA: Diagnosis not present

## 2018-03-16 ENCOUNTER — Ambulatory Visit (INDEPENDENT_AMBULATORY_CARE_PROVIDER_SITE_OTHER): Payer: Medicare HMO | Admitting: Cardiology

## 2018-03-16 ENCOUNTER — Encounter: Payer: Self-pay | Admitting: Cardiology

## 2018-03-16 VITALS — BP 130/60 | HR 73 | Ht 67.0 in | Wt 165.6 lb

## 2018-03-16 DIAGNOSIS — R002 Palpitations: Secondary | ICD-10-CM | POA: Insufficient documentation

## 2018-03-16 DIAGNOSIS — R0789 Other chest pain: Secondary | ICD-10-CM | POA: Diagnosis not present

## 2018-03-16 DIAGNOSIS — I10 Essential (primary) hypertension: Secondary | ICD-10-CM | POA: Insufficient documentation

## 2018-03-16 HISTORY — DX: Essential (primary) hypertension: I10

## 2018-03-16 HISTORY — DX: Palpitations: R00.2

## 2018-03-16 LAB — BASIC METABOLIC PANEL
BUN/Creatinine Ratio: 17 (ref 12–28)
BUN: 25 mg/dL (ref 8–27)
CO2: 27 mmol/L (ref 20–29)
Calcium: 10.1 mg/dL (ref 8.7–10.3)
Chloride: 98 mmol/L (ref 96–106)
Creatinine, Ser: 1.44 mg/dL — ABNORMAL HIGH (ref 0.57–1.00)
GFR calc Af Amer: 40 mL/min/{1.73_m2} — ABNORMAL LOW (ref >59)
GFR, EST NON AFRICAN AMERICAN: 35 mL/min/{1.73_m2} — AB (ref >59)
GLUCOSE: 121 mg/dL — AB (ref 65–99)
Potassium: 3.4 mmol/L — ABNORMAL LOW (ref 3.5–5.2)
SODIUM: 142 mmol/L (ref 134–144)

## 2018-03-16 MED ORDER — LISINOPRIL 10 MG PO TABS: 10.0000 mg | ORAL_TABLET | Freq: Every day | ORAL | 0 refills | Status: DC

## 2018-03-16 NOTE — Progress Notes (Signed)
Cardiology Office Note:    Date:  03/16/2018   ID:  Rebecca Orr, DOB 10-05-1940, MRN [id number]  PCP:  Rochel Brome, MD  Cardiologist:  Jenean Lindau, MD   Referring MD: Rochel Brome, MD    ASSESSMENT:    1. Chest discomfort   2. Essential hypertension   3. Palpitations    PLAN:    In order of problems listed above:  1. Primary prevention stressed with the patient.  Importance of compliance with diet and medication stressed and he vocalized understanding.  She mentions to me that her symptoms have gotten worse since she started the chlorthalidone.  She is intolerant to multiple medications.  I will stop this medication.  We will obtain a Chem-7 today and start her on lisinopril 10 mg daily she will be back in 2 weeks for a Chem-7 and blood pressure check. 2. In view of chest discomfort we will do an exercise stress Cardiolite.  She will have an echocardiogram to assess murmur heard on auscultation.  In view of palpitations or skipped beat sensation I will check if she has had a TSH and do a 48 Holter monitoring. 3. She will be seen in follow-up appointment in a month or earlier if she has any concerns.  She knows to go to the nearest emergency room for any significant concerns.  I told her that I reviewed [facility] records extensively.  Questions were answered to her satisfaction.   Medication Adjustments/Labs and Tests Ordered: Current medicines are reviewed at length with the patient today.  Concerns regarding medicines are outlined above.  No orders of the defined types were placed in this encounter.  No orders of the defined types were placed in this encounter.    History of Present Illness:    Rebecca Orr is a 78 y.o. female who is being seen today for the evaluation of chest discomfort and dizziness at the request of Cox, Kirsten, MD.  Patient is a pleasant 78 year old female.  She has past medical history of essential  hypertension.  She mentions to me that she has tried to pull medications including beta-blocker, hydrochlorothiazide and amlodipine.  She has not tolerated them well.  With chlorthalidone she has some issues with tingling and numbness feeling in her lower extremities.  She has mild renal insufficiency.  She is here for this evaluation.  She complains of palpitations at times.  This is more like a skipped beat than the palpitations.  At the time of my evaluation, the patient is alert awake oriented and in no distress.  She has chest discomfort not related to exertion.  Past Medical History:  Diagnosis Date  . Anemia 07/08/2015  . Cholecystitis 08/08/2015  . Heartburn 07/08/2015  . Hematochezia 07/08/2015  . Postoperative examination 09/01/2015    Past Surgical History:  Procedure Laterality Date  . CATARACT EXTRACTION    . CHOLECYSTECTOMY    . HEMICOLECTOMY  2017  . SPHINCTEROTOMY  2011    Current Medications: Current Meds  Medication Sig  . aspirin EC 81 MG tablet Take 81 mg by mouth daily.  . chlorthalidone (HYGROTON) 25 MG tablet Take 1 tablet by mouth daily.  . meclizine (ANTIVERT) 25 MG tablet Take 25 mg by mouth 3 (three) times daily as needed.  . ondansetron (ZOFRAN) 4 MG tablet Take 1 tablet by mouth as needed.     Allergies:   Propranolol   Social History   Socioeconomic History  . Marital status: Widowed  Spouse name: Not on file  . Number of children: Not on file  . Years of education: Not on file  . Highest education level: Not on file  Occupational History  . Not on file  Social Needs  . Financial resource strain: Not on file  . Food insecurity:    Worry: Not on file    Inability: Not on file  . Transportation needs:    Medical: Not on file    Non-medical: Not on file  Tobacco Use  . Smoking status: Never Smoker  . Smokeless tobacco: Never Used  Substance and Sexual Activity  . Alcohol use: Not on file  . Drug use: Not on file  . Sexual activity: Not on file    Lifestyle  . Physical activity:    Days per week: Not on file    Minutes per session: Not on file  . Stress: Not on file  Relationships  . Social connections:    Talks on phone: Not on file    Gets together: Not on file    Attends religious service: Not on file    Active member of club or organization: Not on file    Attends meetings of clubs or organizations: Not on file    Relationship status: Not on file  Other Topics Concern  . Not on file  Social History Narrative  . Not on file     Family History: The patient's family history includes Dementia in her mother; Lung cancer in her father.  ROS:   Please see the history of present illness.    All other systems reviewed and are negative.  EKGs/Labs/Other Studies Reviewed:    The following studies were reviewed today: EKG EKG reveals sinus rhythm and nonspecific ST-T changes.   Recent Labs: No results found for requested labs within last 8760 hours.  Recent Lipid Panel No results found for: CHOL, TRIG, HDL, CHOLHDL, VLDL, LDLCALC, LDLDIRECT  Physical Exam:    VS:  BP 130/60 (BP Location: Right Arm, Patient Position: Sitting, Cuff Size: Normal)   Pulse 73   Ht 5\' 7"  (1.702 m)   Wt 165 lb 9.6 oz (75.1 kg)   SpO2 99%   BMI 25.94 kg/m     Wt Readings from Last 3 Encounters:  03/16/18 165 lb 9.6 oz (75.1 kg)     GEN: Patient is in no acute distress HEENT: Normal NECK: No JVD; No carotid bruits LYMPHATICS: No lymphadenopathy CARDIAC: S1 S2 regular, 2/6 systolic murmur at the apex. RESPIRATORY:  Clear to auscultation without rales, wheezing or rhonchi  ABDOMEN: Soft, non-tender, non-distended MUSCULOSKELETAL:  No edema; No deformity  SKIN: Warm and dry NEUROLOGIC:  Alert and oriented x 3 PSYCHIATRIC:  Normal affect    Signed, Jenean Lindau, MD  03/16/2018 9:01 AM    [place]

## 2018-03-16 NOTE — Patient Instructions (Signed)
Medication Instructions:   Your physician has recommended you make the following change in your medication:   START: Lisinopril 10 mg by mouth once daily.   STOP: Chlorthalidone    If you need a refill on your cardiac medications before your next appointment, please call your pharmacy.   Lab work:  Your physician recommends that you return for lab work today: TSH, BMP  In 2 weeks please return for additional lab work, you will have: BMP  If you have labs (blood work) drawn today and your tests are completely normal, you will receive your results only by: Marland Kitchen MyChart Message (if you have MyChart) OR . A paper copy in the mail If you have any lab test that is abnormal or we need to change your treatment, we will call you to review the results.  Testing/Procedures:  Your physician has requested that you have an echocardiogram. Echocardiography is a painless test that uses sound waves to create images of your heart. It provides your doctor with information about the size and shape of your heart and how well your heart's chambers and valves are working. This procedure takes approximately one hour. There are no restrictions for this procedure.  Your physician has requested that you have en exercise stress myoview. For further information please visit [email or web address]. Please follow instruction sheet, as given.  Your physician has recommended that you wear a holter monitor. Holter monitors are medical devices that record the heart's electrical activity. Doctors most often use these monitors to diagnose arrhythmias. Arrhythmias are problems with the speed or rhythm of the heartbeat. The monitor is a small, portable device. You can wear one while you do your normal daily activities. This is usually used to diagnose what is causing palpitations/syncope (passing out).    Follow-Up: At [facility], you and your health needs are our priority.  As part of our continuing mission to provide you  with exceptional heart care, we have created designated Provider Care Teams.  These Care Teams include your primary Cardiologist (physician) and Advanced Practice Providers (APPs -  Physician Assistants and Nurse Practitioners) who all work together to provide you with the care you need, when you need it.  You will need a follow up appointment in 6 weeks.

## 2018-03-17 LAB — TSH: TSH: 3.23 u[IU]/mL (ref 0.450–4.500)

## 2018-03-20 DIAGNOSIS — R42 Dizziness and giddiness: Secondary | ICD-10-CM | POA: Diagnosis not present

## 2018-03-20 DIAGNOSIS — M6281 Muscle weakness (generalized): Secondary | ICD-10-CM | POA: Diagnosis not present

## 2018-03-20 DIAGNOSIS — R2681 Unsteadiness on feet: Secondary | ICD-10-CM | POA: Diagnosis not present

## 2018-03-22 DIAGNOSIS — R2681 Unsteadiness on feet: Secondary | ICD-10-CM | POA: Diagnosis not present

## 2018-03-22 DIAGNOSIS — M6281 Muscle weakness (generalized): Secondary | ICD-10-CM | POA: Diagnosis not present

## 2018-03-22 DIAGNOSIS — R42 Dizziness and giddiness: Secondary | ICD-10-CM | POA: Diagnosis not present

## 2018-03-27 DIAGNOSIS — M6281 Muscle weakness (generalized): Secondary | ICD-10-CM | POA: Diagnosis not present

## 2018-03-27 DIAGNOSIS — R42 Dizziness and giddiness: Secondary | ICD-10-CM | POA: Diagnosis not present

## 2018-03-27 DIAGNOSIS — Z85038 Personal history of other malignant neoplasm of large intestine: Secondary | ICD-10-CM | POA: Diagnosis not present

## 2018-03-27 DIAGNOSIS — R2681 Unsteadiness on feet: Secondary | ICD-10-CM | POA: Diagnosis not present

## 2018-03-27 DIAGNOSIS — D509 Iron deficiency anemia, unspecified: Secondary | ICD-10-CM | POA: Diagnosis not present

## 2018-03-29 DIAGNOSIS — R42 Dizziness and giddiness: Secondary | ICD-10-CM | POA: Diagnosis not present

## 2018-03-29 DIAGNOSIS — R2681 Unsteadiness on feet: Secondary | ICD-10-CM | POA: Diagnosis not present

## 2018-03-29 DIAGNOSIS — M6281 Muscle weakness (generalized): Secondary | ICD-10-CM | POA: Diagnosis not present

## 2018-03-30 ENCOUNTER — Ambulatory Visit (INDEPENDENT_AMBULATORY_CARE_PROVIDER_SITE_OTHER): Payer: Medicare HMO

## 2018-03-30 DIAGNOSIS — R0789 Other chest pain: Secondary | ICD-10-CM

## 2018-03-30 DIAGNOSIS — R002 Palpitations: Secondary | ICD-10-CM

## 2018-03-30 DIAGNOSIS — I10 Essential (primary) hypertension: Secondary | ICD-10-CM | POA: Diagnosis not present

## 2018-03-30 NOTE — Addendum Note (Signed)
Addended by: Tarri Glenn on: 03/30/2018 01:19 PM   Modules accepted: Orders

## 2018-03-30 NOTE — Progress Notes (Signed)
Complete echocardiogram has been performed.  Jimmy Karleigh Bunte RDCS, RVT 

## 2018-03-31 ENCOUNTER — Ambulatory Visit (INDEPENDENT_AMBULATORY_CARE_PROVIDER_SITE_OTHER): Payer: Medicare HMO

## 2018-03-31 ENCOUNTER — Encounter: Payer: Self-pay | Admitting: Emergency Medicine

## 2018-03-31 DIAGNOSIS — R002 Palpitations: Secondary | ICD-10-CM

## 2018-03-31 DIAGNOSIS — R0789 Other chest pain: Secondary | ICD-10-CM

## 2018-03-31 LAB — BASIC METABOLIC PANEL
BUN / CREAT RATIO: 11 — AB (ref 12–28)
BUN: 17 mg/dL (ref 8–27)
CO2: 23 mmol/L (ref 20–29)
Calcium: 9.6 mg/dL (ref 8.7–10.3)
Chloride: 103 mmol/L (ref 96–106)
Creatinine, Ser: 1.49 mg/dL — ABNORMAL HIGH (ref 0.57–1.00)
GFR calc Af Amer: 39 mL/min/{1.73_m2} — ABNORMAL LOW (ref >59)
GFR calc non Af Amer: 34 mL/min/{1.73_m2} — ABNORMAL LOW (ref >59)
Glucose: 89 mg/dL (ref 65–99)
Potassium: 4.3 mmol/L (ref 3.5–5.2)
Sodium: 141 mmol/L (ref 134–144)

## 2018-04-03 DIAGNOSIS — R2681 Unsteadiness on feet: Secondary | ICD-10-CM | POA: Diagnosis not present

## 2018-04-03 DIAGNOSIS — R42 Dizziness and giddiness: Secondary | ICD-10-CM | POA: Diagnosis not present

## 2018-04-03 DIAGNOSIS — M6281 Muscle weakness (generalized): Secondary | ICD-10-CM | POA: Diagnosis not present

## 2018-04-05 ENCOUNTER — Telehealth: Payer: Self-pay | Admitting: *Deleted

## 2018-04-05 DIAGNOSIS — R42 Dizziness and giddiness: Secondary | ICD-10-CM | POA: Diagnosis not present

## 2018-04-05 DIAGNOSIS — R2681 Unsteadiness on feet: Secondary | ICD-10-CM | POA: Diagnosis not present

## 2018-04-05 DIAGNOSIS — M6281 Muscle weakness (generalized): Secondary | ICD-10-CM | POA: Diagnosis not present

## 2018-04-05 NOTE — Telephone Encounter (Signed)
Left message on voicemail per DPR in reference to upcoming appointment scheduled on 04/12/18 at 0800 with detailed instructions given per Myocardial Perfusion Study Information Sheet for the test. LM to arrive 15 minutes early, and that it is imperative to arrive on time for appointment to keep from having the test rescheduled. If you need to cancel or reschedule your appointment, please call the office within 24 hours of your appointment. Failure to do so may result in a cancellation of your appointment, and a $50 no show fee. Phone number given for call back for any questions. Crews Mccollam, Ranae Palms

## 2018-04-06 ENCOUNTER — Telehealth: Payer: Self-pay

## 2018-04-06 NOTE — Telephone Encounter (Signed)
-----   Message from Jenean Lindau, MD sent at 04/05/2018  5:22 PM EST ----- The results of the study is unremarkable. Please inform patient. I will discuss in detail at next appointment. Cc  primary care/referring physician Jenean Lindau, MD 04/05/2018 5:21 PM

## 2018-04-06 NOTE — Telephone Encounter (Signed)
Patient called and notified of test results. 

## 2018-04-10 ENCOUNTER — Telehealth: Payer: Self-pay | Admitting: Cardiology

## 2018-04-10 DIAGNOSIS — B029 Zoster without complications: Secondary | ICD-10-CM | POA: Diagnosis not present

## 2018-04-10 DIAGNOSIS — B0239 Other herpes zoster eye disease: Secondary | ICD-10-CM | POA: Diagnosis not present

## 2018-04-10 NOTE — Telephone Encounter (Signed)
Patient informed to cancel lexiscan on Wednesday and reschedule for after her shingles have crusted over per Dr. Bettina Gavia. Patient is agreeable and verbalized understanding. Call has been transferred to the front desk for rescheduling.

## 2018-04-10 NOTE — Telephone Encounter (Signed)
Pt is scheduled for lexiscan on Wednesday and has the shingles, wants to know if she can still do the test

## 2018-04-17 DIAGNOSIS — B0239 Other herpes zoster eye disease: Secondary | ICD-10-CM | POA: Diagnosis not present

## 2018-04-19 DIAGNOSIS — R2681 Unsteadiness on feet: Secondary | ICD-10-CM | POA: Diagnosis not present

## 2018-04-19 DIAGNOSIS — R42 Dizziness and giddiness: Secondary | ICD-10-CM | POA: Diagnosis not present

## 2018-04-19 DIAGNOSIS — M6281 Muscle weakness (generalized): Secondary | ICD-10-CM | POA: Diagnosis not present

## 2018-04-20 DIAGNOSIS — R69 Illness, unspecified: Secondary | ICD-10-CM | POA: Diagnosis not present

## 2018-04-20 DIAGNOSIS — R42 Dizziness and giddiness: Secondary | ICD-10-CM | POA: Diagnosis not present

## 2018-04-20 DIAGNOSIS — B029 Zoster without complications: Secondary | ICD-10-CM | POA: Diagnosis not present

## 2018-04-20 DIAGNOSIS — I129 Hypertensive chronic kidney disease with stage 1 through stage 4 chronic kidney disease, or unspecified chronic kidney disease: Secondary | ICD-10-CM | POA: Diagnosis not present

## 2018-04-24 DIAGNOSIS — D500 Iron deficiency anemia secondary to blood loss (chronic): Secondary | ICD-10-CM | POA: Diagnosis not present

## 2018-04-24 DIAGNOSIS — C184 Malignant neoplasm of transverse colon: Secondary | ICD-10-CM | POA: Diagnosis not present

## 2018-04-24 DIAGNOSIS — E538 Deficiency of other specified B group vitamins: Secondary | ICD-10-CM | POA: Diagnosis not present

## 2018-04-26 DIAGNOSIS — J029 Acute pharyngitis, unspecified: Secondary | ICD-10-CM | POA: Diagnosis not present

## 2018-04-26 DIAGNOSIS — J3089 Other allergic rhinitis: Secondary | ICD-10-CM | POA: Diagnosis not present

## 2018-04-26 DIAGNOSIS — R42 Dizziness and giddiness: Secondary | ICD-10-CM | POA: Diagnosis not present

## 2018-04-26 DIAGNOSIS — E875 Hyperkalemia: Secondary | ICD-10-CM | POA: Diagnosis not present

## 2018-04-27 ENCOUNTER — Telehealth (HOSPITAL_COMMUNITY): Payer: Self-pay | Admitting: *Deleted

## 2018-04-27 DIAGNOSIS — H5203 Hypermetropia, bilateral: Secondary | ICD-10-CM | POA: Diagnosis not present

## 2018-04-27 DIAGNOSIS — H35373 Puckering of macula, bilateral: Secondary | ICD-10-CM | POA: Diagnosis not present

## 2018-04-27 DIAGNOSIS — Z9841 Cataract extraction status, right eye: Secondary | ICD-10-CM | POA: Diagnosis not present

## 2018-04-27 DIAGNOSIS — I10 Essential (primary) hypertension: Secondary | ICD-10-CM | POA: Diagnosis not present

## 2018-04-27 DIAGNOSIS — Z961 Presence of intraocular lens: Secondary | ICD-10-CM | POA: Diagnosis not present

## 2018-04-27 DIAGNOSIS — H353131 Nonexudative age-related macular degeneration, bilateral, early dry stage: Secondary | ICD-10-CM | POA: Diagnosis not present

## 2018-04-27 DIAGNOSIS — Z9842 Cataract extraction status, left eye: Secondary | ICD-10-CM | POA: Diagnosis not present

## 2018-04-27 DIAGNOSIS — Z0100 Encounter for examination of eyes and vision without abnormal findings: Secondary | ICD-10-CM | POA: Diagnosis not present

## 2018-04-27 DIAGNOSIS — D3131 Benign neoplasm of right choroid: Secondary | ICD-10-CM | POA: Diagnosis not present

## 2018-04-27 DIAGNOSIS — H524 Presbyopia: Secondary | ICD-10-CM | POA: Diagnosis not present

## 2018-04-27 DIAGNOSIS — H52223 Regular astigmatism, bilateral: Secondary | ICD-10-CM | POA: Diagnosis not present

## 2018-04-27 NOTE — Telephone Encounter (Signed)
Left message on voicemail per DPR in reference to upcoming appointment scheduled on 05/03/18 with detailed instructions given per Myocardial Perfusion Study Information Sheet for the test. LM to arrive 15 minutes early, and that it is imperative to arrive on time for appointment to keep from having the test rescheduled. If you need to cancel or reschedule your appointment, please call the office within 24 hours of your appointment. Failure to do so may result in a cancellation of your appointment, and a $50 no show fee. Phone number given for call back for any questions. [place] Thresher Jacqueline   

## 2018-04-28 ENCOUNTER — Ambulatory Visit: Payer: Medicare HMO | Admitting: Cardiology

## 2018-05-01 DIAGNOSIS — N183 Chronic kidney disease, stage 3 (moderate): Secondary | ICD-10-CM | POA: Diagnosis not present

## 2018-05-01 DIAGNOSIS — R42 Dizziness and giddiness: Secondary | ICD-10-CM | POA: Diagnosis not present

## 2018-05-01 DIAGNOSIS — I10 Essential (primary) hypertension: Secondary | ICD-10-CM | POA: Diagnosis not present

## 2018-05-01 DIAGNOSIS — I6523 Occlusion and stenosis of bilateral carotid arteries: Secondary | ICD-10-CM | POA: Diagnosis not present

## 2018-05-03 ENCOUNTER — Ambulatory Visit (INDEPENDENT_AMBULATORY_CARE_PROVIDER_SITE_OTHER): Payer: Medicare HMO

## 2018-05-03 VITALS — Ht 67.0 in | Wt 165.0 lb

## 2018-05-03 DIAGNOSIS — I10 Essential (primary) hypertension: Secondary | ICD-10-CM | POA: Diagnosis not present

## 2018-05-03 DIAGNOSIS — R002 Palpitations: Secondary | ICD-10-CM

## 2018-05-03 DIAGNOSIS — R0789 Other chest pain: Secondary | ICD-10-CM

## 2018-05-03 MED ORDER — TECHNETIUM TC 99M TETROFOSMIN IV KIT: 10.1200 | PACK | Freq: Once | INTRAVENOUS | Status: DC | PRN

## 2018-05-03 MED ORDER — TECHNETIUM TC 99M TETROFOSMIN IV KIT: 30.4000 | PACK | Freq: Once | INTRAVENOUS | Status: DC | PRN

## 2018-05-03 MED ORDER — TECHNETIUM TC 99M TETROFOSMIN IV KIT
32.6000 | PACK | Freq: Once | INTRAVENOUS | Status: AC | PRN
  Administered 2018-05-03: 32.6 via INTRAVENOUS

## 2018-05-03 MED ORDER — TECHNETIUM TC 99M TETROFOSMIN IV KIT
10.1000 | PACK | Freq: Once | INTRAVENOUS | Status: AC | PRN
  Administered 2018-05-03: 10.1 via INTRAVENOUS

## 2018-05-03 MED ORDER — REGADENOSON 0.4 MG/5ML IV SOLN
0.4000 mg | Freq: Once | INTRAVENOUS | Status: AC
  Administered 2018-05-03: 0.4 mg via INTRAVENOUS

## 2018-05-05 LAB — MYOCARDIAL PERFUSION IMAGING
CHL CUP NUCLEAR SDS: 0
CSEPPHR: 116 {beats}/min
LV sys vol: 10 mL
LVDIAVOL: 43 mL (ref 46–106)
NUC STRESS TID: 0.99
Rest HR: 77 {beats}/min
SRS: 0
SSS: 0

## 2018-05-09 ENCOUNTER — Ambulatory Visit (INDEPENDENT_AMBULATORY_CARE_PROVIDER_SITE_OTHER): Payer: Medicare HMO | Admitting: Cardiology

## 2018-05-09 ENCOUNTER — Encounter: Payer: Self-pay | Admitting: Cardiology

## 2018-05-09 VITALS — BP 120/60 | HR 75 | Ht 67.0 in | Wt 162.0 lb

## 2018-05-09 DIAGNOSIS — I10 Essential (primary) hypertension: Secondary | ICD-10-CM | POA: Diagnosis not present

## 2018-05-09 MED ORDER — LISINOPRIL 5 MG PO TABS: ORAL_TABLET | ORAL | 1 refills | Status: DC

## 2018-05-09 MED ORDER — LISINOPRIL 5 MG PO TABS: 10.0000 mg | ORAL_TABLET | Freq: Every day | ORAL | 1 refills | Status: DC

## 2018-05-09 NOTE — Addendum Note (Signed)
Addended by: Beckey Rutter on: 05/09/2018 11:14 AM   Modules accepted: Orders

## 2018-05-09 NOTE — Patient Instructions (Signed)
Medication Instructions:  Your physician has recommended you make the following change in your medication:  DECREASE lisinopril to 5 mg daily   If you need a refill on your cardiac medications before your next appointment, please call your pharmacy.   Lab work: NONE If you have labs (blood work) drawn today and your tests are completely normal, you will receive your results only by: Marland Kitchen MyChart Message (if you have MyChart) OR . A paper copy in the mail If you have any lab test that is abnormal or we need to change your treatment, we will call you to review the results.  Testing/Procedures: NONE  Follow-Up: At [facility], you and your health needs are our priority.  As part of our continuing mission to provide you with exceptional heart care, we have created designated Provider Care Teams.  These Care Teams include your primary Cardiologist (physician) and Advanced Practice Providers (APPs -  Physician Assistants and Nurse Practitioners) who all work together to provide you with the care you need, when you need it. You will need a follow up appointment in 3 months.

## 2018-05-09 NOTE — Progress Notes (Signed)
Cardiology Office Note:    Date:  05/09/2018   ID:  Rebecca Orr, DOB Jun 16, 1940, MRN [id number]  PCP:  Rochel Brome, MD  Cardiologist:  Jenean Lindau, MD   Referring MD: Rochel Brome, MD    ASSESSMENT:    1. Essential hypertension    PLAN:    In order of problems listed above:  1. Primary prevention stressed with the patient.  Importance of compliance with diet and medication stressed and she vocalized understanding. 2. I think her blood pressure is borderline.  She has symptoms suggesting of orthostatic hypotension, postural hypotension.  For this reason I have asked her to cut down lisinopril to 5 mg daily and keep herself well-hydrated and increase some salt intake in the diet and she is agreeable.  I told her to get back to me about the symptoms.  She will get back to [place] in the next week or 2 about her symptoms. 3. Follow-up appointment in 3 months or earlier if she has any concerns.   Medication Adjustments/Labs and Tests Ordered: Current medicines are reviewed at length with the patient today.  Concerns regarding medicines are outlined above.  No orders of the defined types were placed in this encounter.  No orders of the defined types were placed in this encounter.    No chief complaint on file.    History of Present Illness:    Rebecca Orr is a 78 y.o. female.  Patient was evaluated by me for chest discomfort.  Her evaluation was unremarkable including stress test and echocardiogram.  She denies any problems at this time and takes care of activities of daily living.  No chest pain orthopnea or PND.  She mentions to me that whenever she stands up or changes posture she feels a little lightheaded.  I am aware that she is on lisinopril most likely for renal insufficiency.  Her blood pressure is borderline.  Past Medical History:  Diagnosis Date  . Anemia 07/08/2015  . Cholecystitis 08/08/2015  . Heartburn 07/08/2015  . Hematochezia  07/08/2015  . Postoperative examination 09/01/2015    Past Surgical History:  Procedure Laterality Date  . CATARACT EXTRACTION    . CHOLECYSTECTOMY    . HEMICOLECTOMY  2017  . SPHINCTEROTOMY  2011    Current Medications: Current Meds  Medication Sig  . aspirin EC 81 MG tablet Take 81 mg by mouth daily.  Marland Kitchen lisinopril (PRINIVIL,ZESTRIL) 10 MG tablet Take 1 tablet (10 mg total) by mouth daily.   Current Facility-Administered Medications for the 05/09/18 encounter (Office Visit) with Monai Hindes, Reita Cliche, MD  Medication  . technetium tetrofosmin (TC-MYOVIEW) injection 81.15 millicurie  . technetium tetrofosmin (TC-MYOVIEW) injection 72.6 millicurie     Allergies:   Propranolol   Social History   Socioeconomic History  . Marital status: Widowed    Spouse name: Not on file  . Number of children: Not on file  . Years of education: Not on file  . Highest education level: Not on file  Occupational History  . Not on file  Social Needs  . Financial resource strain: Not on file  . Food insecurity:    Worry: Not on file    Inability: Not on file  . Transportation needs:    Medical: Not on file    Non-medical: Not on file  Tobacco Use  . Smoking status: Never Smoker  . Smokeless tobacco: Never Used  Substance and Sexual Activity  . Alcohol use: Not on file  .  Drug use: Not on file  . Sexual activity: Not on file  Lifestyle  . Physical activity:    Days per week: Not on file    Minutes per session: Not on file  . Stress: Not on file  Relationships  . Social connections:    Talks on phone: Not on file    Gets together: Not on file    Attends religious service: Not on file    Active member of club or organization: Not on file    Attends meetings of clubs or organizations: Not on file    Relationship status: Not on file  Other Topics Concern  . Not on file  Social History Narrative  . Not on file     Family History: The patient's family history includes Dementia in her  mother; Lung cancer in her father.  ROS:   Please see the history of present illness.    All other systems reviewed and are negative.  EKGs/Labs/Other Studies Reviewed:    The following studies were reviewed today: I discussed the findings of the test with the patient at length.   Recent Labs: 03/16/2018: TSH 3.230 03/30/2018: BUN 17; Creatinine, Ser 1.49; Potassium 4.3; Sodium 141  Recent Lipid Panel No results found for: CHOL, TRIG, HDL, CHOLHDL, VLDL, LDLCALC, LDLDIRECT  Physical Exam:    VS:  BP 120/60 (BP Location: Right Arm, Patient Position: Sitting, Cuff Size: Normal)   Pulse 75   Ht 5\' 7"  (1.702 m)   Wt 162 lb (73.5 kg)   SpO2 98%   BMI 25.37 kg/m     Wt Readings from Last 3 Encounters:  05/09/18 162 lb (73.5 kg)  05/03/18 165 lb (74.8 kg)  03/16/18 165 lb 9.6 oz (75.1 kg)     GEN: Patient is in no acute distress HEENT: Normal NECK: No JVD; No carotid bruits LYMPHATICS: No lymphadenopathy CARDIAC: Hear sounds regular, 2/6 systolic murmur at the apex. RESPIRATORY:  Clear to auscultation without rales, wheezing or rhonchi  ABDOMEN: Soft, non-tender, non-distended MUSCULOSKELETAL:  No edema; No deformity  SKIN: Warm and dry NEUROLOGIC:  Alert and oriented x 3 PSYCHIATRIC:  Normal affect   Signed, Jenean Lindau, MD  05/09/2018 10:47 AM    [place]

## 2018-05-10 DIAGNOSIS — B0239 Other herpes zoster eye disease: Secondary | ICD-10-CM | POA: Diagnosis not present

## 2018-05-22 DIAGNOSIS — E538 Deficiency of other specified B group vitamins: Secondary | ICD-10-CM | POA: Diagnosis not present

## 2018-05-22 DIAGNOSIS — C184 Malignant neoplasm of transverse colon: Secondary | ICD-10-CM | POA: Diagnosis not present

## 2018-05-23 ENCOUNTER — Telehealth: Payer: Self-pay | Admitting: Cardiology

## 2018-05-23 NOTE — Telephone Encounter (Signed)
Patient is on Lisinpril anddecreased her dose to 5mg  daily and she was told to call back, she is feeling better but not completely back to normal, Please call her to inform.

## 2018-05-24 NOTE — Telephone Encounter (Signed)
Patient still has cough but increase stamina, decrease in dizziness and less fatigue. Patient is currently taking 5 mg lisinopril. Today's BP was 134/60, 118/63 HR 70. Patient would like to know if Dr.RRR would like to discontinue medication but RN sent to MD to advise?

## 2018-05-25 NOTE — Telephone Encounter (Signed)
Yes. Discontinue it and let us know follow up on BP and cough in one week

## 2018-05-25 NOTE — Addendum Note (Signed)
Addended by: Beckey Rutter on: 05/25/2018 12:01 PM   Modules accepted: Orders

## 2018-05-25 NOTE — Telephone Encounter (Signed)
Patient advised to discontinue lisinopril, start taking BP two times each day at same day and keep log. RN will follow up with patient on 06/01/2018 to check on cough. No further questions at this time.

## 2018-06-01 ENCOUNTER — Telehealth: Payer: Self-pay

## 2018-06-01 NOTE — Telephone Encounter (Signed)
Looks good

## 2018-06-01 NOTE — Telephone Encounter (Signed)
Patient called with updates on blood pressures since discontinuing lisinopril on 05/24/2018. Morning systolic 584-417'L, 27/87/1836- 135/72 HR 66, 146/73 HR 73 patient states she is asymptomatic and will continue to monitor BP. Note forwarded to Dr.RRR for further advisement, if needed.

## 2018-06-06 ENCOUNTER — Other Ambulatory Visit: Payer: Self-pay | Admitting: Cardiology

## 2018-06-06 DIAGNOSIS — I10 Essential (primary) hypertension: Secondary | ICD-10-CM

## 2018-06-19 DIAGNOSIS — E538 Deficiency of other specified B group vitamins: Secondary | ICD-10-CM | POA: Diagnosis not present

## 2018-07-17 DIAGNOSIS — E538 Deficiency of other specified B group vitamins: Secondary | ICD-10-CM | POA: Diagnosis not present

## 2018-07-17 DIAGNOSIS — D649 Anemia, unspecified: Secondary | ICD-10-CM | POA: Diagnosis not present

## 2018-07-17 DIAGNOSIS — Z85038 Personal history of other malignant neoplasm of large intestine: Secondary | ICD-10-CM | POA: Diagnosis not present

## 2018-07-17 DIAGNOSIS — N189 Chronic kidney disease, unspecified: Secondary | ICD-10-CM | POA: Diagnosis not present

## 2018-07-17 DIAGNOSIS — R978 Other abnormal tumor markers: Secondary | ICD-10-CM | POA: Diagnosis not present

## 2018-07-18 DIAGNOSIS — E538 Deficiency of other specified B group vitamins: Secondary | ICD-10-CM | POA: Diagnosis not present

## 2018-07-27 DIAGNOSIS — R69 Illness, unspecified: Secondary | ICD-10-CM | POA: Diagnosis not present

## 2018-07-27 DIAGNOSIS — I129 Hypertensive chronic kidney disease with stage 1 through stage 4 chronic kidney disease, or unspecified chronic kidney disease: Secondary | ICD-10-CM | POA: Diagnosis not present

## 2018-08-01 ENCOUNTER — Telehealth: Payer: Self-pay | Admitting: Cardiology

## 2018-08-01 NOTE — Telephone Encounter (Signed)
Virtual Visit Pre-Appointment Phone Call  "(Name), I am calling you today to discuss your upcoming appointment. We are currently trying to limit exposure to the virus that causes COVID-19 by seeing patients at home rather than in the office."  1. "What is the BEST phone number to call the day of the visit?" - include this in appointment notes  2. Do you have or have access to (through a family member/friend) a smartphone with video capability that we can use for your visit?" a. If yes - list this number in appt notes as cell (if different from BEST phone #) and list the appointment type as a VIDEO visit in appointment notes b. If no - list the appointment type as a PHONE visit in appointment notes  3. Confirm consent - "In the setting of the current Covid19 crisis, you are scheduled for a (phone or video) visit with your provider on (date) at (time).  Just as we do with many in-office visits, in order for you to participate in this visit, we must obtain consent.  If you'd like, I can send this to your mychart (if signed up) or email for you to review.  Otherwise, I can obtain your verbal consent now.  All virtual visits are billed to your insurance company just like a normal visit would be.  By agreeing to a virtual visit, we'd like you to understand that the technology does not allow for your provider to perform an examination, and thus may limit your provider's ability to fully assess your condition. If your provider identifies any concerns that need to be evaluated in person, we will make arrangements to do so.  Finally, though the technology is pretty good, we cannot assure that it will always work on either your or our end, and in the setting of a video visit, we may have to convert it to a phone-only visit.  In either situation, we cannot ensure that we have a secure connection.  Are you willing to proceed?" STAFF: Did the patient verbally acknowledge consent to telehealth visit? Document  YES/NO here: YES  4. Advise patient to be prepared - "Two hours prior to your appointment, go ahead and check your blood pressure, pulse, oxygen saturation, and your weight (if you have the equipment to check those) and write them all down. When your visit starts, your provider will ask you for this information. If you have an Apple Watch or Kardia device, please plan to have heart rate information ready on the day of your appointment. Please have a pen and paper handy nearby the day of the visit as well."  5. Give patient instructions for MyChart download to smartphone OR Doximity/Doxy.me as below if video visit (depending on what platform provider is using)  6. Inform patient they will receive a phone call 15 minutes prior to their appointment time (may be from unknown caller ID) so they should be prepared to answer    Rebecca Orr has been deemed a candidate for a follow-up tele-health visit to limit community exposure during the Covid-19 pandemic. I spoke with the patient via phone to ensure availability of phone/video source, confirm preferred email & phone number, and discuss instructions and expectations.  I reminded Rebecca Orr to be prepared with any vital sign and/or heart rhythm information that could potentially be obtained via home monitoring, at the time of her visit. I reminded Rebecca Orr to expect a phone  call prior to her visit.  Isaiah Blakes 08/01/2018 4:29 PM   INSTRUCTIONS FOR DOWNLOADING THE MYCHART APP TO SMARTPHONE  - The patient must first make sure to have activated MyChart and know their login information - If Apple, go to [employer] and type in MyChart in the search bar and download the app. If Android, ask patient to go to [employer] and type in [place] in the search bar and download the app. The app is free but as with any other app downloads, their phone may require them to verify  saved payment information or Apple/Android password.  - The patient will need to then log into the app with their MyChart username and password, and select [facility] as their healthcare provider to link the account. When it is time for your visit, go to the MyChart app, find appointments, and click Begin Video Visit. Be sure to Select Allow for your device to access the Microphone and Camera for your visit. You will then be connected, and your provider will be with you shortly.  **If they have any issues connecting, or need assistance please contact MyChart service desk [phone])**  **If using a computer, in order to ensure the best quality for their visit they will need to use either of the following Internet Browsers: [employer], or Google Chrome**  IF USING DOXIMITY or DOXY.ME - The patient will receive a link just prior to their visit by text.     FULL LENGTH CONSENT FOR TELE-HEALTH VISIT   I hereby voluntarily request, consent and authorize [facility] and its employed or contracted physicians, physician assistants, nurse practitioners or other licensed health care professionals (the Practitioner), to provide me with telemedicine health care services (the Services") as deemed necessary by the treating Practitioner. I acknowledge and consent to receive the Services by the Practitioner via telemedicine. I understand that the telemedicine visit will involve communicating with the Practitioner through live audiovisual communication technology and the disclosure of certain medical information by electronic transmission. I acknowledge that I have been given the opportunity to request an in-person assessment or other available alternative prior to the telemedicine visit and am voluntarily participating in the telemedicine visit.  I understand that I have the right to withhold or withdraw my consent to the use of telemedicine in the course of my care at any time, without  affecting my right to future care or treatment, and that the Practitioner or I may terminate the telemedicine visit at any time. I understand that I have the right to inspect all information obtained and/or recorded in the course of the telemedicine visit and may receive copies of available information for a reasonable fee.  I understand that some of the potential risks of receiving the Services via telemedicine include:   Delay or interruption in medical evaluation due to technological equipment failure or disruption;  Information transmitted may not be sufficient (e.g. poor resolution of images) to allow for appropriate medical decision making by the Practitioner; and/or   In rare instances, security protocols could fail, causing a breach of personal health information.  Furthermore, I acknowledge that it is my responsibility to provide information about my medical history, conditions and care that is complete and accurate to the best of my ability. I acknowledge that Practitioner's advice, recommendations, and/or decision may be based on factors not within their control, such as incomplete or inaccurate data provided by me or distortions of diagnostic images or specimens that may result from  electronic transmissions. I understand that the practice of medicine is not an exact science and that Practitioner makes no warranties or guarantees regarding treatment outcomes. I acknowledge that I will receive a copy of this consent concurrently upon execution via email to the email address I last provided but may also request a printed copy by calling the office of [facility].    I understand that my insurance will be billed for this visit.   I have read or had this consent read to me.  I understand the contents of this consent, which adequately explains the benefits and risks of the Services being provided via telemedicine.   I have been provided ample opportunity to ask questions regarding this  consent and the Services and have had my questions answered to my satisfaction.  I give my informed consent for the services to be provided through the use of telemedicine in my medical care  By participating in this telemedicine visit I agree to the above.

## 2018-08-02 DIAGNOSIS — R69 Illness, unspecified: Secondary | ICD-10-CM | POA: Diagnosis not present

## 2018-08-09 ENCOUNTER — Other Ambulatory Visit: Payer: Self-pay

## 2018-08-09 ENCOUNTER — Encounter: Payer: Self-pay | Admitting: Cardiology

## 2018-08-09 ENCOUNTER — Telehealth (INDEPENDENT_AMBULATORY_CARE_PROVIDER_SITE_OTHER): Payer: Medicare HMO | Admitting: Cardiology

## 2018-08-09 VITALS — BP 136/75 | HR 65 | Temp 97.0°F | Ht 67.0 in | Wt 170.0 lb

## 2018-08-09 DIAGNOSIS — I10 Essential (primary) hypertension: Secondary | ICD-10-CM | POA: Diagnosis not present

## 2018-08-09 NOTE — Patient Instructions (Signed)

## 2018-08-09 NOTE — Progress Notes (Signed)
Virtual Visit via Telephone Note   This visit type was conducted due to national recommendations for restrictions regarding the COVID-19 Pandemic (e.g. social distancing) in an effort to limit this patient's exposure and mitigate transmission in our community.  Due to her co-morbid illnesses, this patient is at least at moderate risk for complications without adequate follow up.  This format is felt to be most appropriate for this patient at this time.  The patient did not have access to video technology/had technical difficulties with video requiring transitioning to audio format only (telephone).  All issues noted in this document were discussed and addressed.  No physical exam could be performed with this format.  Please refer to the patient's chart for her  consent to telehealth for [facility].   Date:  08/09/2018   ID:  Rebecca Orr, DOB 1940/07/15, MRN [id number]  Patient Location: Home Provider Location: Office  PCP:  Rochel Brome, MD  Cardiologist:  No primary care provider on file.  Electrophysiologist:  None   Evaluation Performed:  Follow-Up Visit  Chief Complaint: Essential hypertension  History of Present Illness:    Rebecca Orr is a 78 y.o. female with past medical history of essential hypertension.Marland Kitchen  She was on a blood pressure medication and I discontinued because of symptoms suggesting postural hypotension.  Since then she is done very well.  She is walking on a regular basis age appropriately.  No chest pain orthopnea PND dizziness or any symptoms of postural hypotension.  At the time of my evaluation, the patient is alert awake oriented and in no distress.  The patient does not have symptoms concerning for COVID-19 infection (fever, chills, cough, or new shortness of breath).    Past Medical History:  Diagnosis Date  . Anemia 07/08/2015  . Cholecystitis 08/08/2015  . Heartburn 07/08/2015  . Hematochezia 07/08/2015  . Postoperative  examination 09/01/2015   Past Surgical History:  Procedure Laterality Date  . CATARACT EXTRACTION    . CHOLECYSTECTOMY    . HEMICOLECTOMY  2017  . SPHINCTEROTOMY  2011     Current Meds  Medication Sig  . aspirin EC 81 MG tablet Take 81 mg by mouth daily.  . fexofenadine (ALLEGRA) 180 MG tablet Take 180 mg by mouth daily.   Current Facility-Administered Medications for the 08/09/18 encounter (Telemedicine) with Khalia Gong, Reita Cliche, MD  Medication  . technetium tetrofosmin (TC-MYOVIEW) injection 22.63 millicurie  . technetium tetrofosmin (TC-MYOVIEW) injection 33.5 millicurie     Allergies:   Propranolol   Social History   Tobacco Use  . Smoking status: Never Smoker  . Smokeless tobacco: Never Used  Substance Use Topics  . Alcohol use: Not on file  . Drug use: Not on file     Family Hx: The patient's family history includes Dementia in her mother; Lung cancer in her father.  ROS:   Please see the history of present illness.    As mentioned above All other systems reviewed and are negative.   Prior CV studies:   The following studies were reviewed today:  The results of the stress test echocardiogram and monitoring were discussed with the patient at extensive length  Labs/Other Tests and Data Reviewed:    EKG:  No ECG reviewed.  Recent Labs: 03/16/2018: TSH 3.230 03/30/2018: BUN 17; Creatinine, Ser 1.49; Potassium 4.3; Sodium 141   Recent Lipid Panel No results found for: CHOL, TRIG, HDL, CHOLHDL, LDLCALC, LDLDIRECT  Wt Readings from Last 3 Encounters:  08/09/18  170 lb (77.1 kg)  05/09/18 162 lb (73.5 kg)  05/03/18 165 lb (74.8 kg)     Objective:    Vital Signs:  BP 136/75 (BP Location: Left Arm, Patient Position: Sitting, Cuff Size: Normal)   Pulse 65   Temp (!) 97 F (36.1 C)   Ht 5\' 7"  (1.702 m)   Wt 170 lb (77.1 kg)   BMI 26.63 kg/m    VITAL SIGNS:  reviewed  ASSESSMENT & PLAN:    1. Essential hypertension: I think this is resolved  significantly with lifestyle modification.  Her blood pressure is normal now.  She has an excellent effort tolerance and has studies that were done including stress test echo and Holter monitoring were discussed with her at extensive length and she is very happy to know it.  She will continue her exercise program 2. Patient will be seen in follow-up appointment in 6 months or earlier if the patient has any concerns   COVID-19 Education: The signs and symptoms of COVID-19 were discussed with the patient and how to seek care for testing (follow up with PCP or arrange E-visit).  The importance of social distancing was discussed today.  Time:   Today, I have spent 14 minutes with the patient with telehealth technology discussing the above problems.     Medication Adjustments/Labs and Tests Ordered: Current medicines are reviewed at length with the patient today.  Concerns regarding medicines are outlined above.   Tests Ordered: No orders of the defined types were placed in this encounter.   Medication Changes: No orders of the defined types were placed in this encounter.   Disposition:  Follow up 6 months   Signed, Jenean Lindau, MD  08/09/2018 10:19 AM    [place]

## 2018-08-14 DIAGNOSIS — D518 Other vitamin B12 deficiency anemias: Secondary | ICD-10-CM | POA: Diagnosis not present

## 2018-09-11 DIAGNOSIS — C184 Malignant neoplasm of transverse colon: Secondary | ICD-10-CM | POA: Diagnosis not present

## 2018-09-11 DIAGNOSIS — E538 Deficiency of other specified B group vitamins: Secondary | ICD-10-CM | POA: Diagnosis not present

## 2018-09-11 DIAGNOSIS — D500 Iron deficiency anemia secondary to blood loss (chronic): Secondary | ICD-10-CM | POA: Diagnosis not present

## 2018-10-09 DIAGNOSIS — C184 Malignant neoplasm of transverse colon: Secondary | ICD-10-CM | POA: Diagnosis not present

## 2018-10-09 DIAGNOSIS — E538 Deficiency of other specified B group vitamins: Secondary | ICD-10-CM | POA: Diagnosis not present

## 2018-11-13 DIAGNOSIS — E538 Deficiency of other specified B group vitamins: Secondary | ICD-10-CM | POA: Diagnosis not present

## 2018-11-13 DIAGNOSIS — Z85038 Personal history of other malignant neoplasm of large intestine: Secondary | ICD-10-CM | POA: Diagnosis not present

## 2018-12-11 DIAGNOSIS — D509 Iron deficiency anemia, unspecified: Secondary | ICD-10-CM | POA: Diagnosis not present

## 2018-12-11 DIAGNOSIS — E538 Deficiency of other specified B group vitamins: Secondary | ICD-10-CM | POA: Diagnosis not present

## 2018-12-20 DIAGNOSIS — Z0000 Encounter for general adult medical examination without abnormal findings: Secondary | ICD-10-CM | POA: Diagnosis not present

## 2018-12-20 DIAGNOSIS — Z6827 Body mass index (BMI) 27.0-27.9, adult: Secondary | ICD-10-CM | POA: Diagnosis not present

## 2019-01-17 DIAGNOSIS — D509 Iron deficiency anemia, unspecified: Secondary | ICD-10-CM | POA: Diagnosis not present

## 2019-01-17 DIAGNOSIS — Z85038 Personal history of other malignant neoplasm of large intestine: Secondary | ICD-10-CM | POA: Diagnosis not present

## 2019-01-19 DIAGNOSIS — L821 Other seborrheic keratosis: Secondary | ICD-10-CM | POA: Diagnosis not present

## 2019-01-19 DIAGNOSIS — D229 Melanocytic nevi, unspecified: Secondary | ICD-10-CM | POA: Diagnosis not present

## 2019-01-19 DIAGNOSIS — M25512 Pain in left shoulder: Secondary | ICD-10-CM | POA: Diagnosis not present

## 2019-01-19 DIAGNOSIS — D225 Melanocytic nevi of trunk: Secondary | ICD-10-CM | POA: Diagnosis not present

## 2019-01-22 DIAGNOSIS — Z85038 Personal history of other malignant neoplasm of large intestine: Secondary | ICD-10-CM | POA: Diagnosis not present

## 2019-01-22 DIAGNOSIS — K922 Gastrointestinal hemorrhage, unspecified: Secondary | ICD-10-CM | POA: Diagnosis not present

## 2019-01-22 DIAGNOSIS — R195 Other fecal abnormalities: Secondary | ICD-10-CM | POA: Diagnosis not present

## 2019-01-22 DIAGNOSIS — Z7982 Long term (current) use of aspirin: Secondary | ICD-10-CM | POA: Diagnosis not present

## 2019-01-23 DIAGNOSIS — K449 Diaphragmatic hernia without obstruction or gangrene: Secondary | ICD-10-CM | POA: Diagnosis not present

## 2019-01-23 DIAGNOSIS — K921 Melena: Secondary | ICD-10-CM | POA: Diagnosis not present

## 2019-01-23 DIAGNOSIS — K2980 Duodenitis without bleeding: Secondary | ICD-10-CM | POA: Diagnosis not present

## 2019-01-23 DIAGNOSIS — K2970 Gastritis, unspecified, without bleeding: Secondary | ICD-10-CM | POA: Diagnosis not present

## 2019-01-23 DIAGNOSIS — R195 Other fecal abnormalities: Secondary | ICD-10-CM | POA: Diagnosis not present

## 2019-01-31 DIAGNOSIS — K921 Melena: Secondary | ICD-10-CM | POA: Diagnosis not present

## 2019-01-31 DIAGNOSIS — D649 Anemia, unspecified: Secondary | ICD-10-CM | POA: Diagnosis not present

## 2019-02-02 DIAGNOSIS — Z20828 Contact with and (suspected) exposure to other viral communicable diseases: Secondary | ICD-10-CM | POA: Diagnosis not present

## 2019-02-06 DIAGNOSIS — R69 Illness, unspecified: Secondary | ICD-10-CM | POA: Diagnosis not present

## 2019-02-07 DIAGNOSIS — D509 Iron deficiency anemia, unspecified: Secondary | ICD-10-CM | POA: Diagnosis not present

## 2019-02-07 DIAGNOSIS — R195 Other fecal abnormalities: Secondary | ICD-10-CM | POA: Diagnosis not present

## 2019-02-08 ENCOUNTER — Encounter: Payer: Self-pay | Admitting: Cardiology

## 2019-02-08 ENCOUNTER — Ambulatory Visit (INDEPENDENT_AMBULATORY_CARE_PROVIDER_SITE_OTHER): Payer: Medicare HMO | Admitting: Cardiology

## 2019-02-08 ENCOUNTER — Other Ambulatory Visit: Payer: Self-pay

## 2019-02-08 VITALS — BP 130/68 | Ht 67.0 in | Wt 171.0 lb

## 2019-02-08 DIAGNOSIS — I10 Essential (primary) hypertension: Secondary | ICD-10-CM

## 2019-02-08 DIAGNOSIS — K921 Melena: Secondary | ICD-10-CM

## 2019-02-08 NOTE — Progress Notes (Signed)
Cardiology Office Note:    Date:  02/08/2019   ID:  Rebecca Orr, DOB 1941/01/19, MRN [id number]  PCP:  Rochel Brome, MD  Cardiologist:  Jenean Lindau, MD   Referring MD: Rochel Brome, MD    ASSESSMENT:    1. Essential hypertension   2. Hematochezia    PLAN:    In order of problems listed above:  Essential hypertension: Her blood pressure is stable and patient is comfortable with regular exercise and walking.  Her hemoglobin is greater than 10 and she is being followed by gastroenterologist for hematochezia.  She receives NSAIDs and now she plans to never take it again because this is a second experience that has given her dark stools with NSAIDs.  Lipids followed by  primary care physician and in the past they have been fine and I reviewed the paper that was available to me today and confirmed this.  EKG reveals sinus rhythm and nonspecific ST-T changes. Patient will be seen in follow-up appointment in 6 months or earlier if the patient has any concerns   Medication Adjustments/Labs and Tests Ordered: Current medicines are reviewed at length with the patient today.  Concerns regarding medicines are outlined above.  Orders Placed This Encounter  Procedures  . EKG 12-Lead   No orders of the defined types were placed in this encounter.    Chief Complaint  Patient presents with  . Follow-up     History of Present Illness:    Rebecca Orr is a 78 y.o. female.  Patient has past medical history of essential hypertension.  She denies any problems at this time and takes care of activities of daily living.  No chest pain orthopnea or PND.  She walks on a regular basis.  She received NSAIDs and had black stools and is evaluated by gastroenterologist locally.  At the time of my evaluation, the patient is alert awake oriented and in no distress.  Past Medical History:  Diagnosis Date  . Anemia 07/08/2015  . Cholecystitis 08/08/2015  . Heartburn  07/08/2015  . Hematochezia 07/08/2015  . Postoperative examination 09/01/2015    Past Surgical History:  Procedure Laterality Date  . CATARACT EXTRACTION    . CHOLECYSTECTOMY    . HEMICOLECTOMY  2017  . SPHINCTEROTOMY  2011    Current Medications: Current Meds  Medication Sig  . omeprazole (PRILOSEC) 20 MG capsule Take 20 mg by mouth every morning.   Current Facility-Administered Medications for the 02/08/19 encounter (Office Visit) with , Reita Cliche, MD  Medication  . technetium tetrofosmin (TC-MYOVIEW) injection [id number] millicurie  . technetium tetrofosmin (TC-MYOVIEW) injection [id number] millicurie     Allergies:   Nsaids and Propranolol   Social History   Socioeconomic History  . Marital status: Widowed    Spouse name: Not on file  . Number of children: Not on file  . Years of education: Not on file  . Highest education level: Not on file  Occupational History  . Not on file  Tobacco Use  . Smoking status: Never Smoker  . Smokeless tobacco: Never Used  Substance and Sexual Activity  . Alcohol use: Never  . Drug use: Never  . Sexual activity: Not on file  Other Topics Concern  . Not on file  Social History Narrative  . Not on file   Social Determinants of Health   Financial Resource Strain:   . Difficulty of Paying Living Expenses: Not on file  Food Insecurity:   . Worried  About Running Out of Food in the Last Year: Not on file  . Ran Out of Food in the Last Year: Not on file  Transportation Needs:   . Lack of Transportation (Medical): Not on file  . Lack of Transportation (Non-Medical): Not on file  Physical Activity:   . Days of Exercise per Week: Not on file  . Minutes of Exercise per Session: Not on file  Stress:   . Feeling of Stress : Not on file  Social Connections:   . Frequency of Communication with Friends and Family: Not on file  . Frequency of Social Gatherings with Friends and Family: Not on file  . Attends Religious Services: Not on file  .  Active Member of Clubs or Organizations: Not on file  . Attends Archivist Meetings: Not on file  . Marital Status: Not on file     Family History: The patient's family history includes Dementia in her mother; Lung cancer in her father.  ROS:   Please see the history of present illness.    All other systems reviewed and are negative.  EKGs/Labs/Other Studies Reviewed:    The following studies were reviewed today: Study Highlights   The left ventricular ejection fraction is hyperdynamic (>65%).  Nuclear stress EF: 78%.  Blood pressure demonstrated a normal response to exercise.  There was no ST segment deviation noted during stress.  This is a low risk study.  No evidence of ischemia or scar.  Normal LVEF.     HOLTER MONITOR REPORT:    Date of test:                 03/31/2018 Duration of test:           48 hours Indication:                    Palpitations Ordering physician:         Jyl Heinz MD Referring physician:        Jyl Heinz MD   Baseline rhythm: Sinus  Minimum heart rate: 49 BPM.  Average heart rate: 70 BPM.  Maximal heart rate 119 BPM.  Atrial arrhythmia: None significant.   Ventricular arrhythmia: None significant   Conduction abnormality: None significant  Symptoms: None significant   Conclusion:  Holter monitoring was unremarkable.  Interpreting  cardiologist: Jenean Lindau, MD  Date: 04/05/2018 4:15 PM    Recent Labs: 03/16/2018: TSH 3.230 03/30/2018: BUN 17; Creatinine, Ser 1.49; Potassium 4.3; Sodium 141  Recent Lipid Panel No results found for: CHOL, TRIG, HDL, CHOLHDL, VLDL, LDLCALC, LDLDIRECT  Physical Exam:    VS:  BP 130/68   Ht 5\' 7"  (1.702 m)   Wt 171 lb (77.6 kg)   SpO2 97%   BMI 26.78 kg/m     Wt Readings from Last 3 Encounters:  02/08/19 171 lb (77.6 kg)  08/09/18 170 lb (77.1 kg)  05/09/18 162 lb (73.5 kg)     GEN: Patient is in no acute distress HEENT: Normal NECK: No  JVD; No carotid bruits LYMPHATICS: No lymphadenopathy CARDIAC: Hear sounds regular, 2/6 systolic murmur at the apex. RESPIRATORY:  Clear to auscultation without rales, wheezing or rhonchi  ABDOMEN: Soft, non-tender, non-distended MUSCULOSKELETAL:  No edema; No deformity  SKIN: Warm and dry NEUROLOGIC:  Alert and oriented x 3 PSYCHIATRIC:  Normal affect   Signed, Jenean Lindau, MD  02/08/2019 10:04 AM    [place]

## 2019-02-08 NOTE — Patient Instructions (Signed)

## 2019-02-09 ENCOUNTER — Telehealth: Payer: Self-pay | Admitting: Cardiology

## 2019-02-09 NOTE — Telephone Encounter (Signed)
Please call patient regarding meds ° °

## 2019-02-12 NOTE — Telephone Encounter (Signed)
Telephone call to patient. States wanting to know if she should be on Aspirin 81 mg daily. Last OV note state she is off  aspirin at the moment due to blood in her stool. Is scheduled for a colonoscopy soon. Informed her to stay off th easpirin for right now until the source of he r bleeding is determined.

## 2019-02-13 DIAGNOSIS — C184 Malignant neoplasm of transverse colon: Secondary | ICD-10-CM | POA: Diagnosis not present

## 2019-02-13 DIAGNOSIS — D500 Iron deficiency anemia secondary to blood loss (chronic): Secondary | ICD-10-CM | POA: Diagnosis not present

## 2019-02-26 DIAGNOSIS — R7989 Other specified abnormal findings of blood chemistry: Secondary | ICD-10-CM | POA: Diagnosis not present

## 2019-02-26 DIAGNOSIS — D649 Anemia, unspecified: Secondary | ICD-10-CM | POA: Diagnosis not present

## 2019-03-06 DIAGNOSIS — R69 Illness, unspecified: Secondary | ICD-10-CM | POA: Diagnosis not present

## 2019-03-09 DIAGNOSIS — K648 Other hemorrhoids: Secondary | ICD-10-CM | POA: Diagnosis not present

## 2019-03-09 DIAGNOSIS — Z85048 Personal history of other malignant neoplasm of rectum, rectosigmoid junction, and anus: Secondary | ICD-10-CM | POA: Diagnosis not present

## 2019-03-09 DIAGNOSIS — K644 Residual hemorrhoidal skin tags: Secondary | ICD-10-CM | POA: Diagnosis not present

## 2019-03-09 DIAGNOSIS — Z08 Encounter for follow-up examination after completed treatment for malignant neoplasm: Secondary | ICD-10-CM | POA: Diagnosis not present

## 2019-03-09 DIAGNOSIS — Z980 Intestinal bypass and anastomosis status: Secondary | ICD-10-CM | POA: Diagnosis not present

## 2019-03-09 DIAGNOSIS — K921 Melena: Secondary | ICD-10-CM | POA: Diagnosis not present

## 2019-03-09 DIAGNOSIS — K5730 Diverticulosis of large intestine without perforation or abscess without bleeding: Secondary | ICD-10-CM | POA: Diagnosis not present

## 2019-03-09 DIAGNOSIS — Z85038 Personal history of other malignant neoplasm of large intestine: Secondary | ICD-10-CM | POA: Diagnosis not present

## 2019-03-14 DIAGNOSIS — C184 Malignant neoplasm of transverse colon: Secondary | ICD-10-CM | POA: Diagnosis not present

## 2019-03-14 DIAGNOSIS — E538 Deficiency of other specified B group vitamins: Secondary | ICD-10-CM | POA: Diagnosis not present

## 2019-03-28 DIAGNOSIS — Z13820 Encounter for screening for osteoporosis: Secondary | ICD-10-CM | POA: Diagnosis not present

## 2019-03-28 DIAGNOSIS — N959 Unspecified menopausal and perimenopausal disorder: Secondary | ICD-10-CM | POA: Diagnosis not present

## 2019-03-28 DIAGNOSIS — Z1231 Encounter for screening mammogram for malignant neoplasm of breast: Secondary | ICD-10-CM | POA: Diagnosis not present

## 2019-04-11 DIAGNOSIS — Z8673 Personal history of transient ischemic attack (TIA), and cerebral infarction without residual deficits: Secondary | ICD-10-CM | POA: Diagnosis not present

## 2019-04-11 DIAGNOSIS — C184 Malignant neoplasm of transverse colon: Secondary | ICD-10-CM | POA: Diagnosis not present

## 2019-04-11 DIAGNOSIS — N189 Chronic kidney disease, unspecified: Secondary | ICD-10-CM | POA: Diagnosis not present

## 2019-04-11 DIAGNOSIS — D631 Anemia in chronic kidney disease: Secondary | ICD-10-CM | POA: Diagnosis not present

## 2019-04-11 DIAGNOSIS — E538 Deficiency of other specified B group vitamins: Secondary | ICD-10-CM | POA: Diagnosis not present

## 2019-04-11 DIAGNOSIS — Z85038 Personal history of other malignant neoplasm of large intestine: Secondary | ICD-10-CM | POA: Diagnosis not present

## 2019-05-09 DIAGNOSIS — Z85038 Personal history of other malignant neoplasm of large intestine: Secondary | ICD-10-CM | POA: Diagnosis not present

## 2019-05-09 DIAGNOSIS — D509 Iron deficiency anemia, unspecified: Secondary | ICD-10-CM | POA: Diagnosis not present

## 2019-06-06 DIAGNOSIS — D500 Iron deficiency anemia secondary to blood loss (chronic): Secondary | ICD-10-CM | POA: Diagnosis not present

## 2019-06-06 DIAGNOSIS — C184 Malignant neoplasm of transverse colon: Secondary | ICD-10-CM | POA: Diagnosis not present

## 2019-06-06 DIAGNOSIS — E538 Deficiency of other specified B group vitamins: Secondary | ICD-10-CM | POA: Diagnosis not present

## 2019-07-02 DIAGNOSIS — N189 Chronic kidney disease, unspecified: Secondary | ICD-10-CM | POA: Diagnosis not present

## 2019-07-02 DIAGNOSIS — D649 Anemia, unspecified: Secondary | ICD-10-CM | POA: Diagnosis not present

## 2019-07-02 DIAGNOSIS — C184 Malignant neoplasm of transverse colon: Secondary | ICD-10-CM | POA: Diagnosis not present

## 2019-07-02 DIAGNOSIS — Z8673 Personal history of transient ischemic attack (TIA), and cerebral infarction without residual deficits: Secondary | ICD-10-CM | POA: Diagnosis not present

## 2019-07-02 DIAGNOSIS — E538 Deficiency of other specified B group vitamins: Secondary | ICD-10-CM | POA: Diagnosis not present

## 2019-07-02 DIAGNOSIS — D631 Anemia in chronic kidney disease: Secondary | ICD-10-CM | POA: Diagnosis not present

## 2019-07-02 DIAGNOSIS — Z85038 Personal history of other malignant neoplasm of large intestine: Secondary | ICD-10-CM | POA: Diagnosis not present

## 2019-07-31 DIAGNOSIS — C184 Malignant neoplasm of transverse colon: Secondary | ICD-10-CM | POA: Diagnosis not present

## 2019-07-31 DIAGNOSIS — D518 Other vitamin B12 deficiency anemias: Secondary | ICD-10-CM | POA: Diagnosis not present

## 2019-08-10 ENCOUNTER — Other Ambulatory Visit: Payer: Self-pay

## 2019-08-10 ENCOUNTER — Encounter: Payer: Self-pay | Admitting: Cardiology

## 2019-08-10 ENCOUNTER — Ambulatory Visit: Payer: Medicare HMO | Admitting: Cardiology

## 2019-08-10 VITALS — BP 122/88 | HR 82 | Ht 67.0 in | Wt 174.4 lb

## 2019-08-10 DIAGNOSIS — I10 Essential (primary) hypertension: Secondary | ICD-10-CM

## 2019-08-10 DIAGNOSIS — R002 Palpitations: Secondary | ICD-10-CM | POA: Diagnosis not present

## 2019-08-10 NOTE — Patient Instructions (Signed)

## 2019-08-10 NOTE — Progress Notes (Signed)
Cardiology Office Note:    Date:  08/10/2019   ID:  Rebecca Orr, DOB [birth year], MRN [id number]  PCP:  Rochel Brome, MD  Cardiologist:  Jenean Lindau, MD   Referring MD: Rochel Brome, MD    ASSESSMENT:    1. Essential hypertension   2. Palpitations    PLAN:    In order of problems listed above:  1. Primary prevention stressed with the patient.  Importance of compliance with diet medication stressed and she vocalized understanding. 2. Patient mentions to me that she has gained some weight and has been lax with diet.  I emphasized with her the importance of sticking to her diet and regular exercise and she promises to do better. 3. Essential hypertension: Blood pressure is stable 4. Palpitations: These have resolved completely and she is very happy about it. 5. She has had GI bleeding in the past and has completely quit using NSAIDs and has not had any issues with this. 6. Patient will be seen in follow-up appointment in 6 months or earlier if the patient has any concerns    Medication Adjustments/Labs and Tests Ordered: Current medicines are reviewed at length with the patient today.  Concerns regarding medicines are outlined above.  No orders of the defined types were placed in this encounter.  No orders of the defined types were placed in this encounter.    Chief Complaint  Patient presents with  . Follow-up    6 MO FU      History of Present Illness:    Rebecca Orr is a 79 y.o. female.  Patient has past medical history of essential hypertension and palpitations.  She denies any problems at this time and takes care of activities of daily living.  No chest pain orthopnea or PND.  Her palpitations have resolved significantly.  Her blood pressure is under control.  Past Medical History:  Diagnosis Date  . Anemia 07/08/2015  . Cholecystitis 08/08/2015  . Heartburn 07/08/2015  . Hematochezia 07/08/2015  . Postoperative examination  09/01/2015    Past Surgical History:  Procedure Laterality Date  . CATARACT EXTRACTION    . CHOLECYSTECTOMY    . HEMICOLECTOMY  2017  . SPHINCTEROTOMY  2011    Current Medications: Current Meds  Medication Sig  . aspirin 81 MG EC tablet Take 81 mg by mouth daily. Swallow whole.  . famotidine (PEPCID) 40 MG tablet Take 40 mg by mouth 2 (two) times daily.   Current Facility-Administered Medications for the 08/10/19 encounter (Office Visit) with Kavion Mancinas, Reita Cliche, MD  Medication  . technetium tetrofosmin (TC-MYOVIEW) injection 84.13 millicurie  . technetium tetrofosmin (TC-MYOVIEW) injection 24.4 millicurie     Allergies:   Nsaids and Propranolol   Social History   Socioeconomic History  . Marital status: Widowed    Spouse name: Not on file  . Number of children: Not on file  . Years of education: Not on file  . Highest education level: Not on file  Occupational History  . Not on file  Tobacco Use  . Smoking status: Never Smoker  . Smokeless tobacco: Never Used  Vaping Use  . Vaping Use: Never used  Substance and Sexual Activity  . Alcohol use: Never  . Drug use: Never  . Sexual activity: Not on file  Other Topics Concern  . Not on file  Social History Narrative  . Not on file   Social Determinants of Health   Financial Resource Strain:   . Difficulty  of Paying Living Expenses:   Food Insecurity:   . Worried About Charity fundraiser in the Last Year:   . Arboriculturist in the Last Year:   Transportation Needs:   . Film/video editor (Medical):   Marland Kitchen Lack of Transportation (Non-Medical):   Physical Activity:   . Days of Exercise per Week:   . Minutes of Exercise per Session:   Stress:   . Feeling of Stress :   Social Connections:   . Frequency of Communication with Friends and Family:   . Frequency of Social Gatherings with Friends and Family:   . Attends Religious Services:   . Active Member of Clubs or Organizations:   . Attends Archivist  Meetings:   Marland Kitchen Marital Status:      Family History: The patient's family history includes Dementia in her mother; Lung cancer in her father.  ROS:   Please see the history of present illness.    All other systems reviewed and are negative.  EKGs/Labs/Other Studies Reviewed:    The following studies were reviewed today: I discussed my findings with the patient at length.   Recent Labs: No results found for requested labs within last 8760 hours.  Recent Lipid Panel No results found for: CHOL, TRIG, HDL, CHOLHDL, VLDL, LDLCALC, LDLDIRECT  Physical Exam:    VS:  BP 122/88   Pulse 82   Ht 5\' 7"  (1.702 m)   Wt 174 lb 6.4 oz (79.1 kg)   SpO2 97%   BMI 27.31 kg/m     Wt Readings from Last 3 Encounters:  08/10/19 174 lb 6.4 oz (79.1 kg)  02/08/19 171 lb (77.6 kg)  08/09/18 170 lb (77.1 kg)     GEN: Patient is in no acute distress HEENT: Normal NECK: No JVD; No carotid bruits LYMPHATICS: No lymphadenopathy CARDIAC: Hear sounds regular, 2/6 systolic murmur at the apex. RESPIRATORY:  Clear to auscultation without rales, wheezing or rhonchi  ABDOMEN: Soft, non-tender, non-distended MUSCULOSKELETAL:  No edema; No deformity  SKIN: Warm and dry NEUROLOGIC:  Alert and oriented x 3 PSYCHIATRIC:  Normal affect   Signed, Jenean Lindau, MD  08/10/2019 3:10 PM    [facility]

## 2019-08-14 DIAGNOSIS — R69 Illness, unspecified: Secondary | ICD-10-CM | POA: Diagnosis not present

## 2019-08-20 ENCOUNTER — Encounter: Payer: Self-pay | Admitting: Family Medicine

## 2019-08-20 ENCOUNTER — Other Ambulatory Visit: Payer: Self-pay

## 2019-08-20 ENCOUNTER — Ambulatory Visit (INDEPENDENT_AMBULATORY_CARE_PROVIDER_SITE_OTHER): Payer: Medicare HMO | Admitting: Family Medicine

## 2019-08-20 VITALS — BP 148/78 | HR 72 | Temp 96.7°F | Resp 18 | Ht 67.0 in | Wt 172.6 lb

## 2019-08-20 DIAGNOSIS — N1832 Chronic kidney disease, stage 3b: Secondary | ICD-10-CM

## 2019-08-20 DIAGNOSIS — M25562 Pain in left knee: Secondary | ICD-10-CM | POA: Diagnosis not present

## 2019-08-20 DIAGNOSIS — M25561 Pain in right knee: Secondary | ICD-10-CM | POA: Diagnosis not present

## 2019-08-20 DIAGNOSIS — R002 Palpitations: Secondary | ICD-10-CM | POA: Diagnosis not present

## 2019-08-20 DIAGNOSIS — D518 Other vitamin B12 deficiency anemias: Secondary | ICD-10-CM

## 2019-08-20 DIAGNOSIS — I10 Essential (primary) hypertension: Secondary | ICD-10-CM | POA: Diagnosis not present

## 2019-08-20 DIAGNOSIS — M1712 Unilateral primary osteoarthritis, left knee: Secondary | ICD-10-CM | POA: Diagnosis not present

## 2019-08-20 DIAGNOSIS — R12 Heartburn: Secondary | ICD-10-CM | POA: Diagnosis not present

## 2019-08-20 DIAGNOSIS — R202 Paresthesia of skin: Secondary | ICD-10-CM

## 2019-08-20 DIAGNOSIS — D500 Iron deficiency anemia secondary to blood loss (chronic): Secondary | ICD-10-CM

## 2019-08-20 DIAGNOSIS — I129 Hypertensive chronic kidney disease with stage 1 through stage 4 chronic kidney disease, or unspecified chronic kidney disease: Secondary | ICD-10-CM | POA: Diagnosis not present

## 2019-08-20 HISTORY — DX: Pain in right knee: M25.561

## 2019-08-20 HISTORY — DX: Pain in left knee: M25.562

## 2019-08-20 HISTORY — DX: Chronic kidney disease, stage 3b: N18.32

## 2019-08-20 HISTORY — DX: Paresthesia of skin: R20.2

## 2019-08-20 NOTE — Patient Instructions (Signed)
Try voltaren gel otc as directed for your knee pain. If it is helping, call back to get a prescription. Get knee xrays. If shows arthritis, consider knee injection.

## 2019-08-20 NOTE — Progress Notes (Signed)
Established Patient Office Visit  Subjective:  Patient ID: Rebecca Orr, female    DOB: Dec 23, 1940  Age: 79 y.o. MRN: [id number]  CC:  Chief Complaint  Patient presents with  . Hypertension  . Anemia    HPI Rebecca Orr presents for follow up of hypertension and anemia. Was recently seen by Dr. Hinton Rao and had labwork..   Patient had an UGI bleed a few months ago and went to the ED. She underwent EGD by Dr. Lyda Jester which per the patient showed mild gastritis. She also underwent colonoscopy and it was normal.   She has a history of anemia (b12 and iron deficiency.)   Colon cancer was diagnosed in 2017 and pt had a partial colectomy. She still follows up with Dr. Hinton Rao and Dr. Noberto Retort..   Past Medical History:  Diagnosis Date  . Anemia 07/08/2015  . Cancer [facility])    Colon cancer 2017   . Cholecystitis 08/08/2015  . GERD (gastroesophageal reflux disease)   . Heartburn 07/08/2015  . Hematochezia 07/08/2015  . Iron deficiency anemia   . Postoperative examination 09/01/2015  . Renal stone   . Transient ischemic attack     Past Surgical History:  Procedure Laterality Date  . CATARACT EXTRACTION    . CHOLECYSTECTOMY    . HEMICOLECTOMY  2017  . SPHINCTEROTOMY  2011    Family History  Problem Relation Age of Onset  . Dementia Mother   . Lung cancer Father   . Heart disease Father   . Renal cancer Sister   . Prostate cancer Brother     Social History   Socioeconomic History  . Marital status: Widowed    Spouse name: Not on file  . Number of children: Not on file  . Years of education: Not on file  . Highest education level: Not on file  Occupational History  . Not on file  Tobacco Use  . Smoking status: Never Smoker  . Smokeless tobacco: Never Used  Vaping Use  . Vaping Use: Never used  Substance and Sexual Activity  . Alcohol use: Never  . Drug use: Never  . Sexual activity: Not on file  Other Topics Concern  . Not on file    Social History Narrative  . Not on file   Social Determinants of Health   Financial Resource Strain:   . Difficulty of Paying Living Expenses:   Food Insecurity:   . Worried About Charity fundraiser in the Last Year:   . Arboriculturist in the Last Year:   Transportation Needs:   . Film/video editor (Medical):   Marland Kitchen Lack of Transportation (Non-Medical):   Physical Activity:   . Days of Exercise per Week:   . Minutes of Exercise per Session:   Stress:   . Feeling of Stress :   Social Connections:   . Frequency of Communication with Friends and Family:   . Frequency of Social Gatherings with Friends and Family:   . Attends Religious Services:   . Active Member of Clubs or Organizations:   . Attends Archivist Meetings:   Marland Kitchen Marital Status:   Intimate Partner Violence:   . Fear of Current or Ex-Partner:   . Emotionally Abused:   Marland Kitchen Physically Abused:   . Sexually Abused:     Outpatient Medications Prior to Visit  Medication Sig Dispense Refill  . aspirin 81 MG EC tablet Take 81 mg by mouth daily. Swallow whole.    Marland Kitchen  famotidine (PEPCID) 40 MG tablet Take 40 mg by mouth 2 (two) times daily.    Marland Kitchen loratadine (CLARITIN) 10 MG tablet Take 10 mg by mouth daily.    Marland Kitchen omeprazole (PRILOSEC) 20 MG capsule Take 20 mg by mouth every morning. (Patient not taking: Reported on 08/10/2019)     Facility-Administered Medications Prior to Visit  Medication Dose Route Frequency Provider Last Rate Last Admin  . technetium tetrofosmin (TC-MYOVIEW) injection 10.62 millicurie  69.48 millicurie Intravenous Once PRN Park Liter, MD      . technetium tetrofosmin (TC-MYOVIEW) injection 54.6 millicurie  27.0 millicurie Intravenous Once PRN Park Liter, MD        Allergies  Allergen Reactions  . Nsaids Other (See Comments)    GI Bleed  . Propranolol Other (See Comments)    Unknown    ROS Review of Systems  Constitutional: Negative for chills, fatigue, fever and  unexpected weight change.  HENT: Positive for rhinorrhea. Negative for congestion and ear pain.   Respiratory: Negative for cough, shortness of breath and wheezing.   Cardiovascular: Negative for chest pain and palpitations.  Gastrointestinal: Negative for abdominal pain, constipation, diarrhea, nausea and vomiting.  Endocrine: Negative for polydipsia and polyphagia.  Genitourinary: Negative for dysuria, frequency and hematuria.  Musculoskeletal: Negative for arthralgias and back pain. Myalgias: leg cramps   Neurological: Negative for dizziness, weakness and headaches.  Psychiatric/Behavioral: Negative for dysphoric mood. The patient is not nervous/anxious.       Objective:    Physical Exam Vitals reviewed.  Constitutional:      Appearance: Normal appearance. She is well-developed.  Cardiovascular:     Rate and Rhythm: Normal rate and regular rhythm.     Heart sounds: Normal heart sounds.  Pulmonary:     Effort: Pulmonary effort is normal.     Breath sounds: Normal breath sounds.  Abdominal:     General: Bowel sounds are normal.     Palpations: Abdomen is soft.     Tenderness: There is no abdominal tenderness.  Musculoskeletal:        General: Tenderness (patellar apprehension  sign BL. tender over medial side of BL knees. Negative ligament laxity or drawer movement. neg mcmurrays) present.  Neurological:     Mental Status: She is alert.  Psychiatric:        Behavior: Behavior normal.     BP (!) 148/78   Pulse 72   Temp (!) 96.7 F (35.9 C)   Resp 18   Ht 5\' 7"  (1.702 m)   Wt 172 lb 9.6 oz (78.3 kg)   BMI 27.03 kg/m  Wt Readings from Last 3 Encounters:  08/20/19 172 lb 9.6 oz (78.3 kg)  08/10/19 174 lb 6.4 oz (79.1 kg)  02/08/19 171 lb (77.6 kg)     Health Maintenance Due  Topic Date Due  . Hepatitis C Screening  Never done  . COVID-19 Vaccine (1) Never done  . TETANUS/TDAP  Never done  . DEXA SCAN  Never done  . PNA vac Low Risk Adult (1 of 2 - PCV13)  Never done    There are no preventive care reminders to display for this patient.  Lab Results  Component Value Date   TSH 3.230 03/16/2018   No results found for: WBC, HGB, HCT, MCV, PLT  No results found for: CHOL No results found for: HDL No results found for: LDLCALC No results found for: TRIG No results found for: CHOLHDL No results found for: HGBA1C  Assessment & Plan:   1. CKD, stage 3b  - check cmp 2. Iron deficiency anemia due to chronic blood loss - Iron - Ferritin  3. Essential hypertension - BP not quite at goal. Work on [employer]. - CBC with Differential/Platelet - lipid panel  4. Palpitations - resolved. - Comprehensive metabolic panel  5. Heartburn The current medical regimen is effective;  continue present plan and medications.  6. Acute pain of right knee - DG Knee Complete 4 Views Right; Future  7. Acute pain of left knee - DG Knee Complete 4 Views Left; Future  8. Other vitamin B12 deficiency anemia - B12 and Folate Panel  9. Paresthesias - TSH - VITAMIN D 25 Hydroxy (Vit-D Deficiency, Fractures); Future - B12 and Folate Panel   Follow-up: Return in about 3 months (around 11/20/2019) for fasting visit.    Rochel Brome, MD

## 2019-08-21 LAB — COMPREHENSIVE METABOLIC PANEL
ALT: 11 IU/L (ref 0–32)
AST: 18 IU/L (ref 0–40)
Albumin/Globulin Ratio: 2.2 (ref 1.2–2.2)
Albumin: 4.4 g/dL (ref 3.7–4.7)
Alkaline Phosphatase: 81 IU/L (ref 48–121)
BUN/Creatinine Ratio: 11 — ABNORMAL LOW (ref 12–28)
BUN: 18 mg/dL (ref 8–27)
Bilirubin Total: 0.4 mg/dL (ref 0.0–1.2)
CO2: 23 mmol/L (ref 20–29)
Calcium: 9.6 mg/dL (ref 8.7–10.3)
Chloride: 107 mmol/L — ABNORMAL HIGH (ref 96–106)
Creatinine, Ser: 1.57 mg/dL — ABNORMAL HIGH (ref 0.57–1.00)
GFR calc Af Amer: 36 mL/min/{1.73_m2} — ABNORMAL LOW (ref >59)
GFR calc non Af Amer: 31 mL/min/{1.73_m2} — ABNORMAL LOW (ref >59)
Globulin, Total: 2 g/dL (ref 1.5–4.5)
Glucose: 102 mg/dL — ABNORMAL HIGH (ref 65–99)
Potassium: 5 mmol/L (ref 3.5–5.2)
Sodium: 143 mmol/L (ref 134–144)
Total Protein: 6.4 g/dL (ref 6.0–8.5)

## 2019-08-21 LAB — CBC WITH DIFFERENTIAL/PLATELET
Basophils Absolute: 0.1 10*3/uL (ref 0.0–0.2)
Basos: 1 %
EOS (ABSOLUTE): 0.8 10*3/uL — ABNORMAL HIGH (ref 0.0–0.4)
Eos: 16 %
Hematocrit: 33.9 % — ABNORMAL LOW (ref 34.0–46.6)
Hemoglobin: 11.6 g/dL (ref 11.1–15.9)
Immature Grans (Abs): 0 10*3/uL (ref 0.0–0.1)
Immature Granulocytes: 0 %
Lymphocytes Absolute: 1.5 10*3/uL (ref 0.7–3.1)
Lymphs: 30 %
MCH: 29.6 pg (ref 26.6–33.0)
MCHC: 34.2 g/dL (ref 31.5–35.7)
MCV: 87 fL (ref 79–97)
Monocytes Absolute: 0.4 10*3/uL (ref 0.1–0.9)
Monocytes: 8 %
Neutrophils Absolute: 2.3 10*3/uL (ref 1.4–7.0)
Neutrophils: 45 %
Platelets: 203 10*3/uL (ref 150–450)
RBC: 3.92 x10E6/uL (ref 3.77–5.28)
RDW: 13.1 % (ref 11.7–15.4)
WBC: 5.1 10*3/uL (ref 3.4–10.8)

## 2019-08-21 LAB — IRON: Iron: 68 ug/dL (ref 27–139)

## 2019-08-21 LAB — LIPID PANEL
Chol/HDL Ratio: 3.2 ratio (ref 0.0–4.4)
Cholesterol, Total: 196 mg/dL (ref 100–199)
HDL: 62 mg/dL (ref >39)
LDL Chol Calc (NIH): 115 mg/dL — ABNORMAL HIGH (ref 0–99)
Triglycerides: 105 mg/dL (ref 0–149)
VLDL Cholesterol Cal: 19 mg/dL (ref 5–40)

## 2019-08-21 LAB — CARDIOVASCULAR RISK ASSESSMENT

## 2019-08-21 LAB — B12 AND FOLATE PANEL
Folate: 13.4 ng/mL (ref >3.0)
Vitamin B-12: 911 pg/mL (ref 232–1245)

## 2019-08-21 LAB — TSH: TSH: 2.25 u[IU]/mL (ref 0.450–4.500)

## 2019-08-21 LAB — FERRITIN: Ferritin: 28 ng/mL (ref 15–150)

## 2019-08-22 ENCOUNTER — Other Ambulatory Visit: Payer: Self-pay

## 2019-08-22 DIAGNOSIS — M25562 Pain in left knee: Secondary | ICD-10-CM

## 2019-08-22 NOTE — Progress Notes (Signed)
r 

## 2019-08-27 DIAGNOSIS — D518 Other vitamin B12 deficiency anemias: Secondary | ICD-10-CM | POA: Diagnosis not present

## 2019-08-27 DIAGNOSIS — C184 Malignant neoplasm of transverse colon: Secondary | ICD-10-CM | POA: Diagnosis not present

## 2019-08-27 DIAGNOSIS — D500 Iron deficiency anemia secondary to blood loss (chronic): Secondary | ICD-10-CM | POA: Diagnosis not present

## 2019-09-17 DIAGNOSIS — M1712 Unilateral primary osteoarthritis, left knee: Secondary | ICD-10-CM | POA: Diagnosis not present

## 2019-09-24 DIAGNOSIS — Z85038 Personal history of other malignant neoplasm of large intestine: Secondary | ICD-10-CM | POA: Diagnosis not present

## 2019-09-24 DIAGNOSIS — D509 Iron deficiency anemia, unspecified: Secondary | ICD-10-CM | POA: Diagnosis not present

## 2019-10-22 DIAGNOSIS — E538 Deficiency of other specified B group vitamins: Secondary | ICD-10-CM | POA: Diagnosis not present

## 2019-10-22 DIAGNOSIS — C184 Malignant neoplasm of transverse colon: Secondary | ICD-10-CM | POA: Diagnosis not present

## 2019-10-22 DIAGNOSIS — D500 Iron deficiency anemia secondary to blood loss (chronic): Secondary | ICD-10-CM | POA: Diagnosis not present

## 2019-11-07 DIAGNOSIS — M1712 Unilateral primary osteoarthritis, left knee: Secondary | ICD-10-CM | POA: Diagnosis not present

## 2019-11-19 DIAGNOSIS — D500 Iron deficiency anemia secondary to blood loss (chronic): Secondary | ICD-10-CM | POA: Diagnosis not present

## 2019-11-19 DIAGNOSIS — C184 Malignant neoplasm of transverse colon: Secondary | ICD-10-CM | POA: Diagnosis not present

## 2019-11-22 ENCOUNTER — Ambulatory Visit (INDEPENDENT_AMBULATORY_CARE_PROVIDER_SITE_OTHER): Payer: Medicare HMO | Admitting: Family Medicine

## 2019-11-22 ENCOUNTER — Encounter: Payer: Self-pay | Admitting: Family Medicine

## 2019-11-22 ENCOUNTER — Other Ambulatory Visit: Payer: Self-pay

## 2019-11-22 VITALS — BP 128/82 | HR 64 | Temp 97.2°F | Ht 67.0 in | Wt 170.0 lb

## 2019-11-22 DIAGNOSIS — M791 Myalgia, unspecified site: Secondary | ICD-10-CM | POA: Diagnosis not present

## 2019-11-22 NOTE — Patient Instructions (Addendum)
Try Tonic water (one cup daily) Trial hyland's cramps if tonic water does not work. May decrease pepcid to once daily. Less than 1% of time it can cause muscle cramps.  Call back in 2 weeks if not improved.

## 2019-11-22 NOTE — Progress Notes (Signed)
Established Patient Office Visit  Subjective:  Patient ID: Rebecca Orr, female    DOB: 1940/05/23  Age: 79 y.o. MRN: [id number]  CC:  Chief Complaint  Patient presents with  . Anemia  . Hypertension    HPI Lovenia Dianne Delorse Lek Agostino presents for follow up of continued leg pain from knees down.  Her legs cramp at night. Awakens at 3-4 am because she feels like she has to stretch and then gets cramps in her feet that are very painful.  Symptoms have been going on for over one year. Pt has OA left knee and was given a steroid injection. May have helped her knee pain a little bit, but not the leg pain.     Past Medical History:  Diagnosis Date  . Anemia 07/08/2015  . Cancer [facility])    Colon cancer 2017   . Cholecystitis 08/08/2015  . GERD (gastroesophageal reflux disease)   . Heartburn 07/08/2015  . Hematochezia 07/08/2015  . Iron deficiency anemia   . Postoperative examination 09/01/2015  . Renal stone   . Transient ischemic attack     Past Surgical History:  Procedure Laterality Date  . CATARACT EXTRACTION    . CHOLECYSTECTOMY    . HEMICOLECTOMY  2017  . SPHINCTEROTOMY  2011    Family History  Problem Relation Age of Onset  . Dementia Mother   . Lung cancer Father   . Heart disease Father   . Renal cancer Sister   . Prostate cancer Brother     Social History   Socioeconomic History  . Marital status: Widowed    Spouse name: Not on file  . Number of children: Not on file  . Years of education: Not on file  . Highest education level: Not on file  Occupational History  . Not on file  Tobacco Use  . Smoking status: Never Smoker  . Smokeless tobacco: Never Used  Vaping Use  . Vaping Use: Never used  Substance and Sexual Activity  . Alcohol use: Never  . Drug use: Never  . Sexual activity: Not on file  Other Topics Concern  . Not on file  Social History Narrative  . Not on file   Social Determinants of Health   Financial Resource Strain:     . Difficulty of Paying Living Expenses: Not on file  Food Insecurity:   . Worried About Charity fundraiser in the Last Year: Not on file  . Ran Out of Food in the Last Year: Not on file  Transportation Needs:   . Lack of Transportation (Medical): Not on file  . Lack of Transportation (Non-Medical): Not on file  Physical Activity:   . Days of Exercise per Week: Not on file  . Minutes of Exercise per Session: Not on file  Stress:   . Feeling of Stress : Not on file  Social Connections:   . Frequency of Communication with Friends and Family: Not on file  . Frequency of Social Gatherings with Friends and Family: Not on file  . Attends Religious Services: Not on file  . Active Member of Clubs or Organizations: Not on file  . Attends Archivist Meetings: Not on file  . Marital Status: Not on file  Intimate Partner Violence:   . Fear of Current or Ex-Partner: Not on file  . Emotionally Abused: Not on file  . Physically Abused: Not on file  . Sexually Abused: Not on file    Outpatient Medications Prior  to Visit  Medication Sig Dispense Refill  . aspirin 81 MG EC tablet Take 81 mg by mouth daily. Swallow whole.    . famotidine (PEPCID) 40 MG tablet Take 40 mg by mouth 2 (two) times daily.    Marland Kitchen loratadine (CLARITIN) 10 MG tablet Take 10 mg by mouth daily.    Marland Kitchen technetium tetrofosmin (TC-MYOVIEW) injection 81.82 millicurie     . technetium tetrofosmin (TC-MYOVIEW) injection 99.3 millicurie      No facility-administered medications prior to visit.    Allergies  Allergen Reactions  . Nsaids Other (See Comments)    GI Bleed  . Propranolol Other (See Comments)    Unknown    ROS Review of Systems  Constitutional: Negative for chills, fatigue, fever and unexpected weight change.  HENT: Negative for congestion, ear pain and rhinorrhea.   Respiratory: Negative for cough, shortness of breath and wheezing.   Cardiovascular: Negative for chest pain and palpitations.   Gastrointestinal: Positive for abdominal pain. Negative for constipation, diarrhea, nausea and vomiting.  Endocrine: Negative for polydipsia and polyphagia.  Genitourinary: Negative for dysuria, frequency and hematuria.  Musculoskeletal: Positive for arthralgias. Negative for back pain. Myalgias: leg cramps   Neurological: Negative for dizziness, weakness and headaches.  Psychiatric/Behavioral: Negative for dysphoric mood. The patient is not nervous/anxious.       Objective:    Physical Exam Vitals reviewed.  Constitutional:      Appearance: Normal appearance. She is well-developed.  Cardiovascular:     Rate and Rhythm: Normal rate and regular rhythm.     Pulses: Normal pulses.     Heart sounds: Normal heart sounds.  Pulmonary:     Effort: Pulmonary effort is normal.     Breath sounds: Normal breath sounds.  Abdominal:     General: Bowel sounds are normal.     Palpations: Abdomen is soft.     Tenderness: There is no abdominal tenderness.  Musculoskeletal:        General: No tenderness.     Right lower leg: No edema.     Left lower leg: No edema.  Neurological:     Mental Status: She is alert.  Psychiatric:        Behavior: Behavior normal.     BP 128/82   Pulse 64   Temp (!) 97.2 F (36.2 C)   Ht 5\' 7"  (1.702 m)   Wt 170 lb (77.1 kg)   SpO2 100%   BMI 26.63 kg/m  Wt Readings from Last 3 Encounters:  11/22/19 170 lb (77.1 kg)  08/20/19 172 lb 9.6 oz (78.3 kg)  08/10/19 174 lb 6.4 oz (79.1 kg)     Health Maintenance Due  Topic Date Due  . Hepatitis C Screening  Never done  . TETANUS/TDAP  Never done  . DEXA SCAN  Never done  . PNA vac Low Risk Adult (1 of 2 - PCV13) Never done    There are no preventive care reminders to display for this patient.  Lab Results  Component Value Date   TSH 2.250 08/20/2019   Lab Results  Component Value Date   WBC 5.1 08/20/2019   HGB 11.6 08/20/2019   HCT 33.9 (L) 08/20/2019   MCV 87 08/20/2019   PLT 203  08/20/2019    Lab Results  Component Value Date   CHOL 196 08/20/2019   Lab Results  Component Value Date   HDL 62 08/20/2019   Lab Results  Component Value Date   LDLCALC 115 (H) 08/20/2019  Lab Results  Component Value Date   TRIG 105 08/20/2019   Lab Results  Component Value Date   CHOLHDL 3.2 08/20/2019   No results found for: HGBA1C    Assessment & Plan:  1. Muscle pain Try Tonic water (one cup daily) Trial hyland's cramps if tonic water does not work. May decrease pepcid to once daily. Less than 1% of time it can cause muscle cramps.  Call back in 2 weeks if not improved.  - Magnesium - Phosphorus - CK - Comprehensive metabolic panel - CBC with Differential/Platelet  Follow-up: Return in about 4 weeks (around 12/20/2019) for leg cramps.    Rochel Brome, MD

## 2019-11-23 LAB — CBC WITH DIFFERENTIAL/PLATELET
Basophils Absolute: 0.1 10*3/uL (ref 0.0–0.2)
Basos: 1 %
EOS (ABSOLUTE): 0.3 10*3/uL (ref 0.0–0.4)
Eos: 5 %
Hematocrit: 35.9 % (ref 34.0–46.6)
Hemoglobin: 11.7 g/dL (ref 11.1–15.9)
Immature Grans (Abs): 0 10*3/uL (ref 0.0–0.1)
Immature Granulocytes: 0 %
Lymphocytes Absolute: 1.5 10*3/uL (ref 0.7–3.1)
Lymphs: 26 %
MCH: 29.5 pg (ref 26.6–33.0)
MCHC: 32.6 g/dL (ref 31.5–35.7)
MCV: 90 fL (ref 79–97)
Monocytes Absolute: 0.5 10*3/uL (ref 0.1–0.9)
Monocytes: 8 %
Neutrophils Absolute: 3.5 10*3/uL (ref 1.4–7.0)
Neutrophils: 60 %
Platelets: 204 10*3/uL (ref 150–450)
RBC: 3.97 x10E6/uL (ref 3.77–5.28)
RDW: 13 % (ref 11.7–15.4)
WBC: 5.8 10*3/uL (ref 3.4–10.8)

## 2019-11-23 LAB — COMPREHENSIVE METABOLIC PANEL
ALT: 13 IU/L (ref 0–32)
AST: 17 IU/L (ref 0–40)
Albumin/Globulin Ratio: 1.9 (ref 1.2–2.2)
Albumin: 4.3 g/dL (ref 3.7–4.7)
Alkaline Phosphatase: 68 IU/L (ref 44–121)
BUN/Creatinine Ratio: 14 (ref 12–28)
BUN: 19 mg/dL (ref 8–27)
Bilirubin Total: 0.4 mg/dL (ref 0.0–1.2)
CO2: 26 mmol/L (ref 20–29)
Calcium: 9.5 mg/dL (ref 8.7–10.3)
Chloride: 104 mmol/L (ref 96–106)
Creatinine, Ser: 1.38 mg/dL — ABNORMAL HIGH (ref 0.57–1.00)
GFR calc Af Amer: 42 mL/min/{1.73_m2} — ABNORMAL LOW (ref >59)
GFR calc non Af Amer: 36 mL/min/{1.73_m2} — ABNORMAL LOW (ref >59)
Globulin, Total: 2.3 g/dL (ref 1.5–4.5)
Glucose: 96 mg/dL (ref 65–99)
Potassium: 4.1 mmol/L (ref 3.5–5.2)
Sodium: 141 mmol/L (ref 134–144)
Total Protein: 6.6 g/dL (ref 6.0–8.5)

## 2019-11-23 LAB — PHOSPHORUS: Phosphorus: 3.4 mg/dL (ref 3.0–4.3)

## 2019-11-23 LAB — CK: Total CK: 128 U/L (ref 32–182)

## 2019-11-23 LAB — MAGNESIUM: Magnesium: 2.3 mg/dL (ref 1.6–2.3)

## 2019-12-08 ENCOUNTER — Encounter: Payer: Self-pay | Admitting: Pharmacist

## 2019-12-13 ENCOUNTER — Other Ambulatory Visit: Payer: Self-pay | Admitting: Hematology and Oncology

## 2019-12-13 DIAGNOSIS — E538 Deficiency of other specified B group vitamins: Secondary | ICD-10-CM | POA: Insufficient documentation

## 2019-12-13 HISTORY — DX: Deficiency of other specified B group vitamins: E53.8

## 2019-12-17 ENCOUNTER — Telehealth: Payer: Self-pay | Admitting: Oncology

## 2019-12-17 ENCOUNTER — Other Ambulatory Visit: Payer: Self-pay

## 2019-12-17 ENCOUNTER — Inpatient Hospital Stay: Payer: Medicare HMO | Attending: Oncology

## 2019-12-17 VITALS — BP 139/59 | HR 65 | Temp 97.7°F | Resp 18 | Wt 171.0 lb

## 2019-12-17 DIAGNOSIS — E538 Deficiency of other specified B group vitamins: Secondary | ICD-10-CM | POA: Diagnosis not present

## 2019-12-17 MED ORDER — CYANOCOBALAMIN 1000 MCG/ML IJ SOLN
1000.0000 ug | Freq: Once | INTRAMUSCULAR | Status: AC
  Administered 2019-12-17: 1000 ug via INTRAMUSCULAR

## 2019-12-17 MED ORDER — CYANOCOBALAMIN 1000 MCG/ML IJ SOLN
INTRAMUSCULAR | Status: AC
  Filled 2019-12-17: qty 1

## 2019-12-17 NOTE — Telephone Encounter (Signed)
Scheduled appts per treatment plan and converted appts from [facility]. Gave pt a print out of appt calendar.

## 2019-12-17 NOTE — Patient Instructions (Signed)

## 2019-12-17 NOTE — Progress Notes (Signed)
Pt is stable at discharge. 

## 2019-12-26 ENCOUNTER — Other Ambulatory Visit: Payer: Self-pay

## 2019-12-26 ENCOUNTER — Ambulatory Visit (INDEPENDENT_AMBULATORY_CARE_PROVIDER_SITE_OTHER): Payer: Medicare HMO | Admitting: Family Medicine

## 2019-12-26 ENCOUNTER — Encounter: Payer: Self-pay | Admitting: Family Medicine

## 2019-12-26 VITALS — BP 136/64 | Temp 97.2°F | Ht 67.0 in | Wt 170.0 lb

## 2019-12-26 DIAGNOSIS — R252 Cramp and spasm: Secondary | ICD-10-CM

## 2019-12-26 DIAGNOSIS — I129 Hypertensive chronic kidney disease with stage 1 through stage 4 chronic kidney disease, or unspecified chronic kidney disease: Secondary | ICD-10-CM | POA: Diagnosis not present

## 2019-12-26 MED ORDER — TIZANIDINE HCL 2 MG PO TABS: 2.0000 mg | ORAL_TABLET | Freq: Every day | ORAL | 2 refills | Status: DC

## 2019-12-26 NOTE — Progress Notes (Signed)
Established Patient Office Visit  Subjective:  Patient ID: Rebecca Orr, female    DOB: 05-06-1940  Age: 79 y.o. MRN: [id number]  CC:  Chief Complaint  Patient presents with  . leg cramps    HPI [facility] Delorse Lek Billingham presents for follow up of continued leg pain from knees down.  Her legs cramp at night. Awakens at 3-4 am because she feels like she has to stretch and then gets cramps in her feet that are very painful.  Symptoms have been going on for over one year. Pt has OA left knee and was given a steroid injection. She has tried drinking tonic water which has not helped and even tried [place] Leg Cramp OTC medication which has not helped either. Happens at night in early am. Not during the day. Not with ambulation.   Past Medical History:  Diagnosis Date  . Anemia 07/08/2015  . Cancer [facility])    Colon cancer 2017   . Cholecystitis 08/08/2015  . GERD (gastroesophageal reflux disease)   . Heartburn 07/08/2015  . Hematochezia 07/08/2015  . Iron deficiency anemia   . Postoperative examination 09/01/2015  . Renal stone   . Transient ischemic attack     Past Surgical History:  Procedure Laterality Date  . CATARACT EXTRACTION    . CHOLECYSTECTOMY    . HEMICOLECTOMY  2017  . SPHINCTEROTOMY  2011    Family History  Problem Relation Age of Onset  . Dementia Mother   . Lung cancer Father   . Heart disease Father   . Renal cancer Sister   . Prostate cancer Brother     Social History   Socioeconomic History  . Marital status: Widowed    Spouse name: Not on file  . Number of children: Not on file  . Years of education: Not on file  . Highest education level: Not on file  Occupational History  . Not on file  Tobacco Use  . Smoking status: Never Smoker  . Smokeless tobacco: Never Used  Vaping Use  . Vaping Use: Never used  Substance and Sexual Activity  . Alcohol use: Never  . Drug use: Never  . Sexual activity: Not on file  Other Topics Concern  .  Not on file  Social History Narrative  . Not on file   Social Determinants of Health   Financial Resource Strain:   . Difficulty of Paying Living Expenses: Not on file  Food Insecurity:   . Worried About Charity fundraiser in the Last Year: Not on file  . Ran Out of Food in the Last Year: Not on file  Transportation Needs:   . Lack of Transportation (Medical): Not on file  . Lack of Transportation (Non-Medical): Not on file  Physical Activity:   . Days of Exercise per Week: Not on file  . Minutes of Exercise per Session: Not on file  Stress:   . Feeling of Stress : Not on file  Social Connections:   . Frequency of Communication with Friends and Family: Not on file  . Frequency of Social Gatherings with Friends and Family: Not on file  . Attends Religious Services: Not on file  . Active Member of Clubs or Organizations: Not on file  . Attends Archivist Meetings: Not on file  . Marital Status: Not on file  Intimate Partner Violence:   . Fear of Current or Ex-Partner: Not on file  . Emotionally Abused: Not on file  . Physically  Abused: Not on file  . Sexually Abused: Not on file    Outpatient Medications Prior to Visit  Medication Sig Dispense Refill  . aspirin 81 MG EC tablet Take 81 mg by mouth daily. Swallow whole.    . famotidine (PEPCID) 40 MG tablet Take 40 mg by mouth 2 (two) times daily.    Marland Kitchen loratadine (CLARITIN) 10 MG tablet Take 10 mg by mouth daily.     No facility-administered medications prior to visit.    Allergies  Allergen Reactions  . Nsaids Other (See Comments)    GI Bleed  . Propranolol Other (See Comments)    Unknown    ROS Review of Systems  Constitutional: Negative for chills, fatigue, fever and unexpected weight change.  HENT: Negative for congestion, ear pain and rhinorrhea.   Respiratory: Negative for cough, shortness of breath and wheezing.   Cardiovascular: Negative for chest pain and palpitations.  Gastrointestinal:  Negative for abdominal pain, constipation, diarrhea, nausea and vomiting.  Endocrine: Negative for polydipsia and polyphagia.  Genitourinary: Negative for dysuria, frequency and hematuria.  Musculoskeletal: Positive for arthralgias and myalgias (leg cramps ). Negative for back pain.  Neurological: Negative for dizziness, weakness and headaches.  Psychiatric/Behavioral: Negative for dysphoric mood. The patient is not nervous/anxious.       Objective:    Physical Exam Vitals reviewed.  Constitutional:      Appearance: Normal appearance. She is well-developed.  Cardiovascular:     Rate and Rhythm: Normal rate and regular rhythm.     Pulses: Normal pulses.     Heart sounds: Normal heart sounds.  Pulmonary:     Effort: Pulmonary effort is normal.     Breath sounds: Normal breath sounds.  Abdominal:     General: Bowel sounds are normal.     Palpations: Abdomen is soft.     Tenderness: There is no abdominal tenderness.  Musculoskeletal:        General: No tenderness.     Right lower leg: No edema.     Left lower leg: No edema.  Neurological:     Mental Status: She is alert.  Psychiatric:        Behavior: Behavior normal.     BP 136/64   Temp (!) 97.2 F (36.2 C)   Ht 5\' 7"  (1.702 m)   Wt 170 lb (77.1 kg)   BMI 26.63 kg/m  Wt Readings from Last 3 Encounters:  12/26/19 170 lb (77.1 kg)  12/17/19 171 lb 0.4 oz (77.6 kg)  11/22/19 170 lb (77.1 kg)     Health Maintenance Due  Topic Date Due  . Hepatitis C Screening  Never done  . TETANUS/TDAP  Never done  . DEXA SCAN  Never done  . PNA vac Low Risk Adult (1 of 2 - PCV13) Never done    There are no preventive care reminders to display for this patient.  Lab Results  Component Value Date   TSH 2.250 08/20/2019   Lab Results  Component Value Date   WBC 5.8 11/22/2019   HGB 11.7 11/22/2019   HCT 35.9 11/22/2019   MCV 90 11/22/2019   PLT 204 11/22/2019    Lab Results  Component Value Date   CHOL 196  08/20/2019   Lab Results  Component Value Date   HDL 62 08/20/2019   Lab Results  Component Value Date   LDLCALC 115 (H) 08/20/2019   Lab Results  Component Value Date   TRIG 105 08/20/2019   Lab Results  Component Value Date   CHOLHDL 3.2 08/20/2019   No results found for: HGBA1C    Assessment & Plan:  1. Muscle pain Start tizanidine 2 milligrams once at nighttime for muscle cramps.  May increase to 4 mg after 3 days if not helping.  As long as you are tolerating it may increase to 4 mg at night. - Magnesium - Phosphorus - Magnesium - Comprehensive metabolic panel  Follow-up: Return in about 4 weeks (around 01/23/2020) for AWV with me. also following up on muscle cramps. Rochel Brome, MD

## 2019-12-26 NOTE — Patient Instructions (Signed)
Start tizanidine 2 milligrams once at nighttime for muscle cramps.  May increase to 4 mg after 3 days if not helping.  As long as you are tolerating it may increase to 4 mg at night.

## 2019-12-27 LAB — COMPREHENSIVE METABOLIC PANEL
ALT: 11 IU/L (ref 0–32)
AST: 18 IU/L (ref 0–40)
Albumin/Globulin Ratio: 1.8 (ref 1.2–2.2)
Albumin: 4.2 g/dL (ref 3.7–4.7)
Alkaline Phosphatase: 74 IU/L (ref 44–121)
BUN/Creatinine Ratio: 12 (ref 12–28)
BUN: 17 mg/dL (ref 8–27)
Bilirubin Total: 0.3 mg/dL (ref 0.0–1.2)
CO2: 26 mmol/L (ref 20–29)
Calcium: 9.5 mg/dL (ref 8.7–10.3)
Chloride: 104 mmol/L (ref 96–106)
Creatinine, Ser: 1.37 mg/dL — ABNORMAL HIGH (ref 0.57–1.00)
GFR calc Af Amer: 42 mL/min/{1.73_m2} — ABNORMAL LOW (ref >59)
GFR calc non Af Amer: 37 mL/min/{1.73_m2} — ABNORMAL LOW (ref >59)
Globulin, Total: 2.3 g/dL (ref 1.5–4.5)
Glucose: 113 mg/dL — ABNORMAL HIGH (ref 65–99)
Potassium: 4.8 mmol/L (ref 3.5–5.2)
Sodium: 140 mmol/L (ref 134–144)
Total Protein: 6.5 g/dL (ref 6.0–8.5)

## 2019-12-27 LAB — MAGNESIUM: Magnesium: 2.2 mg/dL (ref 1.6–2.3)

## 2019-12-27 LAB — PHOSPHORUS: Phosphorus: 3.1 mg/dL (ref 3.0–4.3)

## 2019-12-31 ENCOUNTER — Other Ambulatory Visit: Payer: Self-pay | Admitting: Hematology and Oncology

## 2019-12-31 DIAGNOSIS — C184 Malignant neoplasm of transverse colon: Secondary | ICD-10-CM

## 2020-01-07 ENCOUNTER — Other Ambulatory Visit: Payer: Self-pay

## 2020-01-07 DIAGNOSIS — R252 Cramp and spasm: Secondary | ICD-10-CM

## 2020-01-07 MED ORDER — TIZANIDINE HCL 2 MG PO TABS: 2.0000 mg | ORAL_TABLET | Freq: Two times a day (BID) | ORAL | 2 refills | Status: DC

## 2020-01-10 NOTE — Progress Notes (Signed)
[facility]  [address] [place],  [place]  [address] [phone]  Clinic Day:  01/14/2020  Referring physician: Rochel Brome, MD   This document serves as a record of services personally performed by Hosie Poisson, MD. It was created on their behalf by [facility] E, a trained medical scribe. The creation of this record is based on the scribe's personal observations and the provider's statements to them.   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  This was found during evaluation of iron deficiency.  Preoperative CT chest, abdomen and pelvis revealed a lobular mass in the transverse colon at the hepatic flexure, a 4 mm right upper lobe pulmonary nodule, felt to likely be benign, as well as bilateral ovarian cysts and cholelithiasis with mild distention of the gallbladder. CEA was within normal limits.  The patient underwent surgical resection of her colon cancer, as well as cholecystectomy, in June 2017.  Pathology revealed a 3.5 cm, grade 1, adenocarcinoma of the transverse colon with microscopic invasion of the muscularis propria.  Twelve nodes were negative for metastasis.  Pathology of the gallbladder revealed chronic cholecystitis and cholelithiasis.  Mismatch repair testing revealed loss of nuclear expression of MLH1 and PMS2 in less than 5% of the tumor.  Microsatellite instability was high.  BRAF V600E testing was therefore done and found to be positive.  A positive BRAF is indicative of sporadic microsatellite instability, so it is unlikely that this patient has Lynch syndrome.  Her family history is positive for her brother having prostate cancer in his late 67's, sister with renal cancer in  her 71's and a maternal uncle with brain cancer in his 96's.  She was unable to tell us what type of brain tumor or kidney cancer was present, or the Gleason score of her brother's prostate cancer.  There was also a paternal grandmother who had breast cancer in her 86's.  She does not meet criteria for testing for hereditary cancer syndromes due to lack of information, so we have not pursued genetic testing.  Due to the early stage of her cancer, she did not require adjuvant chemotherapy.    She had elevation of her CEA to 5.6 in October 2017, so CT chest, abdomen and pelvis was obtained.  This did not reveal any evidence of malignancy.  The previously noted pulmonary nodule was stable.  She is a nonsmoker.  Her hemoglobin had returned to normal, so her iron supplement was discontinued in October 2017.  At her visit in December 2017, she had recurrent mild anemia with a hemoglobin of 11.6.  She had persistent anemia at her visit in October 2018 and was found to have vitamin B12 deficiency, so she was placed on B12 injections, which she continues.  The CEA has fluctuated up and down between 4.4 and 5 since December 2017 and is not felt to be significant.  She had a colonoscopy in August 2018 by Dr. Noberto Retort, which was negative.  She had an MRI brain, ENT, neurologic and cardiology evaluation and no specific etiology was found for her dizziness.  Annual screening bilateral mammogram from January 27th, 2021 was clear.  Bone density scan from January 27th was normal.  She underwent colonoscopy  with Dr. Lyda Jester on January 8th, 2021, which revealed diverticular disease right colon, and internal and external hemorrhoids, but was otherwise negative.  He told her she could repeat this in 5 years.  She underwent EGD back in November 2020, which revealed erosive duodenitis, gastritis, and a hiatal hernia.  Her last CEA in May 2021 was 4.6.     INTERVAL HISTORY:  [name] is here for routine follow up prior to B12  injection.  She continues to report numbness, paresthesias and cramping of her bilateral lower extremities.  She has been prescribed a muscle relaxer to use as needed.  She has been evaluated by orthopedic surgery and states that she was evaluated with nerve conduction studies about a year and a half ago, but the exam was normal.  Evaluation of the spine may be warranted.  She continues Pepcid BID.  Her  appetite is good, and she has lost 3 pounds since her last visit.  She denies fever, chills or other signs of infection.  She denies nausea, vomiting, bowel issues, or abdominal pain.  She denies sore throat, cough, dyspnea, or chest pain.   REVIEW OF SYSTEMS:  Review of Systems  Musculoskeletal:       Numbness and paresthesias of the bilateral feet and legs.  Leg cramping.  All other systems reviewed and are negative.    VITALS:  Blood pressure (!) 156/67, pulse 73, temperature 97.6 F (36.4 C), temperature source Oral, resp. rate 16, height '5\' 7"'  (1.702 m), weight 170 lb 9.6 oz (77.4 kg), SpO2 100 %.  Wt Readings from Last 3 Encounters:  01/14/20 170 lb 9.6 oz (77.4 kg)  12/26/19 170 lb (77.1 kg)  12/17/19 171 lb 0.4 oz (77.6 kg)    Body mass index is 26.72 kg/m.  Performance status (ECOG): 1 - Symptomatic but completely ambulatory  PHYSICAL EXAM:  Physical Exam Constitutional:      General: She is not in acute distress.    Appearance: Normal appearance. She is normal weight.  HENT:     Head: Normocephalic and atraumatic.  Eyes:     General: No scleral icterus.    Extraocular Movements: Extraocular movements intact.     Conjunctiva/sclera: Conjunctivae normal.     Pupils: Pupils are equal, round, and reactive to light.  Cardiovascular:     Rate and Rhythm: Normal rate and regular rhythm.     Pulses: Normal pulses.     Heart sounds: Normal heart sounds. No murmur heard.  No friction rub. No gallop.   Pulmonary:     Effort: Pulmonary effort is normal. No respiratory distress.      Breath sounds: Normal breath sounds.  Abdominal:     General: Bowel sounds are normal. There is no distension.     Palpations: Abdomen is soft. There is no mass.     Tenderness: There is no abdominal tenderness.  Musculoskeletal:        General: Normal range of motion.     Cervical back: Normal range of motion and neck supple.     Right lower leg: No edema.     Left lower leg: No edema.  Lymphadenopathy:     Cervical: No cervical adenopathy.  Skin:    General: Skin is warm and dry.  Neurological:     General: No focal deficit present.     Mental Status: She is alert and oriented to person, place, and time. Mental status is at baseline.  Psychiatric:  Mood and Affect: Mood normal.        Behavior: Behavior normal.        Thought Content: Thought content normal.        Judgment: Judgment normal.     LABS:   CBC Latest Ref Rng & Units 11/22/2019 08/20/2019  WBC 3.4 - 10.8 x10E3/uL 5.8 5.1  Hemoglobin 11.1 - 15.9 g/dL 11.7 11.6  Hematocrit 34.0 - 46.6 % 35.9 33.9(L)  Platelets 150 - 450 x10E3/uL 204 203   CMP Latest Ref Rng & Units 12/26/2019 11/22/2019 08/20/2019  Glucose 65 - 99 mg/dL 113(H) 96 102(H)  BUN 8 - 27 mg/dL '17 19 18  ' Creatinine 0.57 - 1.00 mg/dL 1.37(H) 1.38(H) 1.57(H)  Sodium 134 - 144 mmol/L 140 141 143  Potassium 3.5 - 5.2 mmol/L 4.8 4.1 5.0  Chloride 96 - 106 mmol/L 104 104 107(H)  CO2 20 - 29 mmol/L '26 26 23  ' Calcium 8.7 - 10.3 mg/dL 9.5 9.5 9.6  Total Protein 6.0 - 8.5 g/dL 6.5 6.6 6.4  Total Bilirubin 0.0 - 1.2 mg/dL 0.3 0.4 0.4  Alkaline Phos 44 - 121 IU/L 74 68 81  AST 0 - 40 IU/L '18 17 18  ' ALT 0 - 32 IU/L '11 13 11     ' No results found for: CEA1 / No results found for: CEA1  Lab Results  Component Value Date   FERRITIN 28 08/20/2019    STUDIES:  No results found.   Allergies:  Allergies  Allergen Reactions  . Nsaids Other (See Comments)    GI Bleed  . Propranolol Other (See Comments)    Unknown    Current  Medications: Current Outpatient Medications  Medication Sig Dispense Refill  . aspirin 81 MG EC tablet Take 81 mg by mouth daily. Swallow whole.    . famotidine (PEPCID) 40 MG tablet Take 40 mg by mouth 2 (two) times daily.    Marland Kitchen loratadine (CLARITIN) 10 MG tablet Take 10 mg by mouth daily.    Marland Kitchen tiZANidine (ZANAFLEX) 2 MG tablet Take 1 tablet (2 mg total) by mouth 2 (two) times daily. 60 tablet 2   No current facility-administered medications for this visit.     ASSESSMENT & PLAN:   Assessment:   1. History of stage I colon cancer diagnosed in May 2017, with no evidence of disease.  She has had a borderline CEA but this remains normal and stable for nearly 2 years.  She will be due for colonoscopy again in January 2026.  2. B12 deficiency, resolved with injections, which she continues every 4 weeks.  3. Chronic kidney disease, which has been steadily worsening over the past year.  She is not on any medications that should cause problems.   4. Mild anemia, which may be due to chronic kidney disease, and some degree of blood loss.  We will continue to monitor this.  5. Gastritis and duodenitis in November 2020.  She continues Pepcid BID.  6. History of prior stroke and so I did encourage her to continue aspirin 81 mg daily.  7. Bilateral leg and feet cramping.  Nerve conduction studies were normal and potassium and magnesium have been in a good range.  Evaluation of the spine may be warranted.  Plan: We will administer B12 today, and again every 4 weeks.  I will call her with her lab results as well as her cancer marker.  We will plan to see her back in 1 year with CBC, comprehensive metabolic profile, and  CEA.  The patient understands the plans discussed today and is in agreement with them.  She knows to contact our office if she develops concerns prior to her next appointment.   I provided 20 minutes of face-to-face time during this this encounter and > 50% was spent counseling as  documented under my assessment and plan.    Derwood Kaplan, MD [facility] AT [facility] [address] [place] [address] Dept: [phone] Dept Fax: [phone]   I, Rita Ohara, am acting as scribe for Derwood Kaplan, MD  I have reviewed this report as typed by the medical scribe, and it is complete and accurate.

## 2020-01-14 ENCOUNTER — Inpatient Hospital Stay: Payer: Medicare HMO | Attending: Oncology

## 2020-01-14 ENCOUNTER — Encounter: Payer: Self-pay | Admitting: Oncology

## 2020-01-14 ENCOUNTER — Other Ambulatory Visit: Payer: Self-pay

## 2020-01-14 ENCOUNTER — Inpatient Hospital Stay: Payer: Medicare HMO

## 2020-01-14 ENCOUNTER — Inpatient Hospital Stay (INDEPENDENT_AMBULATORY_CARE_PROVIDER_SITE_OTHER): Payer: Medicare HMO | Admitting: Oncology

## 2020-01-14 ENCOUNTER — Ambulatory Visit: Payer: Medicare HMO

## 2020-01-14 VITALS — BP 167/74 | HR 61 | Temp 97.7°F | Resp 18 | Ht 67.0 in | Wt 171.0 lb

## 2020-01-14 VITALS — BP 156/67 | HR 73 | Temp 97.6°F | Resp 16 | Ht 67.0 in | Wt 170.6 lb

## 2020-01-14 DIAGNOSIS — E538 Deficiency of other specified B group vitamins: Secondary | ICD-10-CM | POA: Insufficient documentation

## 2020-01-14 DIAGNOSIS — C184 Malignant neoplasm of transverse colon: Secondary | ICD-10-CM

## 2020-01-14 DIAGNOSIS — Z8509 Personal history of malignant neoplasm of other digestive organs: Secondary | ICD-10-CM

## 2020-01-14 DIAGNOSIS — D631 Anemia in chronic kidney disease: Secondary | ICD-10-CM | POA: Diagnosis not present

## 2020-01-14 DIAGNOSIS — N189 Chronic kidney disease, unspecified: Secondary | ICD-10-CM | POA: Diagnosis not present

## 2020-01-14 LAB — CBC WITH DIFFERENTIAL (CANCER CENTER ONLY)
Abs Immature Granulocytes: 0.02 10*3/uL (ref 0.00–0.07)
Basophils Absolute: 0 10*3/uL (ref 0.0–0.1)
Basophils Relative: 1 %
Eosinophils Absolute: 0.6 10*3/uL — ABNORMAL HIGH (ref 0.0–0.5)
Eosinophils Relative: 10 %
HCT: 34 % — ABNORMAL LOW (ref 36.0–46.0)
Hemoglobin: 10.8 g/dL — ABNORMAL LOW (ref 12.0–15.0)
Immature Granulocytes: 0 %
Lymphocytes Relative: 24 %
Lymphs Abs: 1.4 10*3/uL (ref 0.7–4.0)
MCH: 30.6 pg (ref 26.0–34.0)
MCHC: 31.8 g/dL (ref 30.0–36.0)
MCV: 96.3 fL (ref 80.0–100.0)
Monocytes Absolute: 0.5 10*3/uL (ref 0.1–1.0)
Monocytes Relative: 8 %
Neutro Abs: 3.2 10*3/uL (ref 1.7–7.7)
Neutrophils Relative %: 57 %
Platelet Count: 178 10*3/uL (ref 150–400)
RBC: 3.53 MIL/uL — ABNORMAL LOW (ref 3.87–5.11)
RDW: 13.5 % (ref 11.5–15.5)
WBC Count: 5.7 10*3/uL (ref 4.0–10.5)
nRBC: 0 % (ref 0.0–0.2)

## 2020-01-14 LAB — CMP (CANCER CENTER ONLY)
ALT: 15 U/L (ref 0–44)
AST: 22 U/L (ref 15–41)
Albumin: 3.9 g/dL (ref 3.5–5.0)
Alkaline Phosphatase: 48 U/L (ref 38–126)
Anion gap: 7 (ref 5–15)
BUN: 21 mg/dL (ref 8–23)
CO2: 27 mmol/L (ref 22–32)
Calcium: 9.3 mg/dL (ref 8.9–10.3)
Chloride: 108 mmol/L (ref 98–111)
Creatinine: 1.64 mg/dL — ABNORMAL HIGH (ref 0.44–1.00)
GFR, Estimated: 32 mL/min — ABNORMAL LOW (ref >60)
Glucose, Bld: 104 mg/dL — ABNORMAL HIGH (ref 70–99)
Potassium: 4.1 mmol/L (ref 3.5–5.1)
Sodium: 142 mmol/L (ref 135–145)
Total Bilirubin: 0.4 mg/dL (ref 0.3–1.2)
Total Protein: 6.6 g/dL (ref 6.5–8.1)

## 2020-01-14 MED ORDER — CYANOCOBALAMIN 1000 MCG/ML IJ SOLN
INTRAMUSCULAR | Status: AC
  Filled 2020-01-14: qty 1

## 2020-01-14 MED ORDER — CYANOCOBALAMIN 1000 MCG/ML IJ SOLN
1000.0000 ug | Freq: Once | INTRAMUSCULAR | Status: AC
  Administered 2020-01-14: 1000 ug via INTRAMUSCULAR

## 2020-01-14 NOTE — Progress Notes (Signed)
PT STABLE AT TIME OF DISCHARGE 

## 2020-01-14 NOTE — Patient Instructions (Signed)

## 2020-01-15 LAB — CEA: CEA: 4.4 ng/mL (ref 0.0–4.7)

## 2020-01-21 ENCOUNTER — Telehealth: Payer: Self-pay

## 2020-01-21 NOTE — Telephone Encounter (Addendum)
Pt notified of below message, & also requested a copy of labs be mailed to her.   ----- Message from Derwood Kaplan, MD sent at 01/21/2020  1:35 PM EST ----- Regarding: RE: Lab results Tell her blood is overall stable, BS 104.  Kidney function (creatinine 1.64) mildly worse than Sept but not as bad as 6 months ago. She is a little more anemic with hgb of 10.8, poss related to the kidneys CEA normal at 4.4.  Pls send copies to her PCP ----- Message ----- From: Dairl Ponder, RN Sent: 01/21/2020  11:46 AM EST To: Derwood Kaplan, MD Subject: Lab results                                    Pt req lab results from last week. [phone]

## 2020-01-28 DIAGNOSIS — R69 Illness, unspecified: Secondary | ICD-10-CM | POA: Diagnosis not present

## 2020-01-30 ENCOUNTER — Other Ambulatory Visit: Payer: Self-pay

## 2020-01-30 ENCOUNTER — Ambulatory Visit (INDEPENDENT_AMBULATORY_CARE_PROVIDER_SITE_OTHER): Payer: Medicare HMO | Admitting: Family Medicine

## 2020-01-30 ENCOUNTER — Encounter: Payer: Self-pay | Admitting: Family Medicine

## 2020-01-30 VITALS — BP 130/82 | HR 67 | Temp 97.3°F | Ht 67.0 in | Wt 171.0 lb

## 2020-01-30 DIAGNOSIS — Z0000 Encounter for general adult medical examination without abnormal findings: Secondary | ICD-10-CM

## 2020-01-30 DIAGNOSIS — M791 Myalgia, unspecified site: Secondary | ICD-10-CM | POA: Diagnosis not present

## 2020-01-30 DIAGNOSIS — Z6826 Body mass index (BMI) 26.0-26.9, adult: Secondary | ICD-10-CM

## 2020-01-30 MED ORDER — TIZANIDINE HCL 4 MG PO TABS: 4.0000 mg | ORAL_TABLET | Freq: Two times a day (BID) | ORAL | 2 refills | Status: DC | PRN

## 2020-01-30 NOTE — Progress Notes (Signed)
Subjective:  Patient ID: Rebecca Orr, female    DOB: 1940-11-10  Age: 79 y.o. MRN: [id number]  Chief Complaint  Patient presents with  . Annual Exam    HPI Encounter for general adult medical examination without abnormal findings  Physical ("At Risk" items are starred): Patient's last physical exam was 1 year ago .  Smoking: Life-long non-smoker ;  Physical Activity: Exercises and eats healthy.   Alcohol/Drug Use: Is a non-drinker ; No illicit drug use ;  Patient is not afflicted from Stress Incontinence and Urge Incontinence  Safety: reviewed ; Patient wears a seat belt, has smoke detectors, has carbon monoxide detectors, practices appropriate gun safety, and wears sunscreen with extended sun exposure. Dental Care: biannual cleanings, brushes and flosses daily. Ophthalmology/Optometry: Annual visit.  Hearing loss: none Vision impairments: Due for appt  DEXA 03/28/2019 normal. COLONOSCOPY 03/09/2019 MAMMO FINDINGS 03/28/2019 - normal.  Fall Risk  01/30/2020 08/20/2019  Falls in the past year? 1 1  Number falls in past yr: 0 1  Injury with Fall? 0 0  Follow up Falls evaluation completed -     Depression screen [facility] 2/9 01/30/2020 08/20/2019  Decreased Interest 0 0  Down, Depressed, Hopeless 0 0  PHQ - 2 Score 0 0    Functional Status Survey: Is the patient deaf or have difficulty hearing?: No Does the patient have difficulty seeing, even when wearing glasses/contacts?: No Does the patient have difficulty concentrating, remembering, or making decisions?: No Does the patient have difficulty walking or climbing stairs?: No Does the patient have difficulty dressing or bathing?: No Does the patient have difficulty doing errands alone such as visiting a doctor's office or shopping?: No   Social Hx   Social History   Socioeconomic History  . Marital status: Widowed    Spouse name: Not on file  . Number of children: Not on file  . Years of education: Not on  file  . Highest education level: Not on file  Occupational History  . Not on file  Tobacco Use  . Smoking status: Never Smoker  . Smokeless tobacco: Never Used  Vaping Use  . Vaping Use: Never used  Substance and Sexual Activity  . Alcohol use: Never  . Drug use: Never  . Sexual activity: Not on file  Other Topics Concern  . Not on file  Social History Narrative  . Not on file   Social Determinants of Health   Financial Resource Strain:   . Difficulty of Paying Living Expenses: Not on file  Food Insecurity:   . Worried About Charity fundraiser in the Last Year: Not on file  . Ran Out of Food in the Last Year: Not on file  Transportation Needs:   . Lack of Transportation (Medical): Not on file  . Lack of Transportation (Non-Medical): Not on file  Physical Activity:   . Days of Exercise per Week: Not on file  . Minutes of Exercise per Session: Not on file  Stress:   . Feeling of Stress : Not on file  Social Connections:   . Frequency of Communication with Friends and Family: Not on file  . Frequency of Social Gatherings with Friends and Family: Not on file  . Attends Religious Services: Not on file  . Active Member of Clubs or Organizations: Not on file  . Attends Archivist Meetings: Not on file  . Marital Status: Not on file   Past Medical History:  Diagnosis Date  .  Anemia 07/08/2015  . Cancer [facility])    Colon cancer 2017   . Cholecystitis 08/08/2015  . GERD (gastroesophageal reflux disease)   . Heartburn 07/08/2015  . Hematochezia 07/08/2015  . Iron deficiency anemia   . Postoperative examination 09/01/2015  . Renal stone   . Transient ischemic attack    Family History  Problem Relation Age of Onset  . Dementia Mother   . Lung cancer Father   . Heart disease Father   . Renal cancer Sister   . Prostate cancer Brother     Review of Systems  Constitutional: Negative for chills, fatigue and fever.  HENT: Negative for congestion, ear pain, rhinorrhea and  sore throat.   Respiratory: Negative for cough and shortness of breath.   Cardiovascular: Negative for chest pain.  Gastrointestinal: Negative for abdominal pain, constipation, diarrhea, nausea and vomiting.  Genitourinary: Negative for dysuria and urgency.  Musculoskeletal: Positive for myalgias (tizanidine helps, but not enough. ). Negative for back pain.  Neurological: Negative for dizziness, weakness, light-headedness and headaches.  Psychiatric/Behavioral: Negative for dysphoric mood. The patient is not nervous/anxious.      Objective:  BP 130/82   Pulse 67   Temp (!) 97.3 F (36.3 C)   Ht 5\' 7"  (1.702 m)   Wt 171 lb (77.6 kg)   SpO2 99%   BMI 26.78 kg/m   BP/Weight 01/30/2020 01/14/2020 19/41/7408  Systolic BP 144 818 563  Diastolic BP 82 67 74  Wt. (Lbs) 171 170.6 171  BMI 26.78 26.72 26.78    Physical Exam Vitals reviewed.  Constitutional:      Appearance: Normal appearance. She is normal weight.  Cardiovascular:     Rate and Rhythm: Normal rate and regular rhythm.     Heart sounds: Normal heart sounds.  Pulmonary:     Effort: Pulmonary effort is normal. No respiratory distress.     Breath sounds: Normal breath sounds.  Neurological:     Mental Status: She is alert and oriented to person, place, and time.  Psychiatric:        Mood and Affect: Mood normal.        Behavior: Behavior normal.    Lab Results  Component Value Date   WBC 5.7 01/14/2020   HGB 10.8 (L) 01/14/2020   HCT 34.0 (L) 01/14/2020   PLT 178 01/14/2020   GLUCOSE 104 (H) 01/14/2020   CHOL 196 08/20/2019   TRIG 105 08/20/2019   HDL 62 08/20/2019   LDLCALC 115 (H) 08/20/2019   ALT 15 01/14/2020   AST 22 01/14/2020   NA 142 01/14/2020   K 4.1 01/14/2020   CL 108 01/14/2020   CREATININE 1.64 (H) 01/14/2020   BUN 21 01/14/2020   CO2 27 01/14/2020   TSH 2.250 08/20/2019      Assessment & Plan:  1. Encounter for annual wellness exam in Medicare patient Pt to bring back  HCPOA. Recommend continue to work on eating healthy diet and exercise. Refuses all vaccines Osteoporosis prevention education givenl  2. Myalgia Increase tizanidine 4 mg one twice a day.   3. BMI 26.0-26.9,adult Recommend continue to work on eating healthy diet and exercise.   Meds ordered this encounter  Medications  . tiZANidine (ZANAFLEX) 4 MG tablet    Sig: Take 1 tablet (4 mg total) by mouth 2 (two) times daily as needed for muscle spasms.    Dispense:  60 tablet    Refill:  2    This is a list of the  screening recommended for you and due dates:  Health Maintenance  Topic Date Due  .  Hepatitis C: One time screening is recommended by Center for Disease Control  (CDC) for  adults born from [birth year] through 1965.   Never done  . Tetanus Vaccine  Never done  . DEXA scan (bone density measurement)  Never done  . Pneumonia vaccines (1 of 2 - PCV13) Never done  . Flu Shot  05/29/2020*  . COVID-19 Vaccine  Completed  *Topic was postponed. The date shown is not the original due date.     AN INDIVIDUALIZED CARE PLAN: was established or reinforced today.   SELF MANAGEMENT: The patient and I together assessed ways to personally work towards obtaining the recommended goals  Support needs The patient and/or family needs were assessed and services were offered and not necessary at this time.    Follow-up: Return in about 1 year (around 01/29/2021) for AWV.  Rochel Brome, MD [facility] [phone]

## 2020-01-30 NOTE — Patient Instructions (Addendum)
Preventive Care 38 Years and Older, Female Preventive care refers to lifestyle choices and visits with your health care provider that can promote health and wellness. This includes:  A yearly physical exam. This is also called an annual well check.  Regular dental and eye exams.  Immunizations.  Screening for certain conditions.  Healthy lifestyle choices, such as diet and exercise. What can I expect for my preventive care visit? Physical exam Your health care provider will check:  Height and weight. These may be used to calculate body mass index (BMI), which is a measurement that tells if you are at a healthy weight.  Heart rate and blood pressure.  Your skin for abnormal spots. Counseling Your health care provider may ask you questions about:  Alcohol, tobacco, and drug use.  Emotional well-being.  Home and relationship well-being.  Sexual activity.  Eating habits.  History of falls.  Memory and ability to understand (cognition).  Work and work Statistician.  Pregnancy and menstrual history. What immunizations do I need?  Influenza (flu) vaccine  This is recommended every year. Tetanus, diphtheria, and pertussis (Tdap) vaccine  You may need a Td booster every 10 years. Varicella (chickenpox) vaccine  You may need this vaccine if you have not already been vaccinated. Zoster (shingles) vaccine  You may need this after age 33. Pneumococcal conjugate (PCV13) vaccine  One dose is recommended after age 33. Pneumococcal polysaccharide (PPSV23) vaccine  One dose is recommended after age 72. Measles, mumps, and rubella (MMR) vaccine  You may need at least one dose of MMR if you were born in 1957 or later. You may also need a second dose. Meningococcal conjugate (MenACWY) vaccine  You may need this if you have certain conditions. Hepatitis A vaccine  You may need this if you have certain conditions or if you travel or work in places where you may be exposed  to hepatitis A. Hepatitis B vaccine  You may need this if you have certain conditions or if you travel or work in places where you may be exposed to hepatitis B. Haemophilus influenzae type b (Hib) vaccine  You may need this if you have certain conditions. You may receive vaccines as individual doses or as more than one vaccine together in one shot (combination vaccines). Talk with your health care provider about the risks and benefits of combination vaccines. What tests do I need? Blood tests  Lipid and cholesterol levels. These may be checked every 5 years, or more frequently depending on your overall health.  Hepatitis C test.  Hepatitis B test. Screening  Lung cancer screening. You may have this screening every year starting at age 39 if you have a 30-pack-year history of smoking and currently smoke or have quit within the past 15 years.  Colorectal cancer screening. All adults should have this screening starting at age 36 and continuing until age 15. Your health care provider may recommend screening at age 23 if you are at increased risk. You will have tests every 1-10 years, depending on your results and the type of screening test.  Diabetes screening. This is done by checking your blood sugar (glucose) after you have not eaten for a while (fasting). You may have this done every 1-3 years.  Mammogram. This may be done every 1-2 years. Talk with your health care provider about how often you should have regular mammograms.  BRCA-related cancer screening. This may be done if you have a family history of breast, ovarian, tubal, or peritoneal cancers.  Other tests  Sexually transmitted disease (STD) testing.  Bone density scan. This is done to screen for osteoporosis. You may have this done starting at age 71. Follow these instructions at home: Eating and drinking  Eat a diet that includes fresh fruits and vegetables, whole grains, lean protein, and low-fat dairy products. Limit  your intake of foods with high amounts of sugar, saturated fats, and salt.  Take vitamin and mineral supplements as recommended by your health care provider.  Do not drink alcohol if your health care provider tells you not to drink.  If you drink alcohol: ? Limit how much you have to 0-1 drink a day. ? Be aware of how much alcohol is in your drink. In the [place]., one drink equals one 12 oz bottle of beer (355 mL), one 5 oz glass of wine (148 mL), or one 1 oz glass of hard liquor (44 mL). Lifestyle  Take daily care of your teeth and gums.  Stay active. Exercise for at least 30 minutes on 5 or more days each week.  Do not use any products that contain nicotine or tobacco, such as cigarettes, e-cigarettes, and chewing tobacco. If you need help quitting, ask your health care provider.  If you are sexually active, practice safe sex. Use a condom or other form of protection in order to prevent STIs (sexually transmitted infections).  Talk with your health care provider about taking a low-dose aspirin or statin. What's next?  Go to your health care provider once a year for a well check visit.  Ask your health care provider how often you should have your eyes and teeth checked.  Stay up to date on all vaccines. This information is not intended to replace advice given to you by your health care provider. Make sure you discuss any questions you have with your health care provider. Document Revised: 02/09/2018 Document Reviewed: 02/09/2018 Elsevier Patient Education  2020 [place].   Preventing Osteoporosis, Adult Osteoporosis is a condition that causes the bones to lose density. This means that the bones become thinner, and the normal spaces in bone tissue become larger. Low bone density can make the bones weak and cause them to break more easily. Osteoporosis cannot always be prevented, but you can take steps to lower your risk of developing this condition. How can this condition affect  me? If you develop osteoporosis, you will be more likely to break bones in your wrist, spine, or hip. Even a minor accident or injury can be enough to break weak bones. The bones will also be slower to heal. Osteoporosis can cause other problems as well, such as a stooped posture or trouble with movement. Osteoporosis can occur with aging. As you get older, you may lose bone tissue more quickly, or it may be replaced more slowly. Osteoporosis is more likely to develop if you have poor nutrition or do not get enough calcium or vitamin D. Other lifestyle factors can also play a role. By eating a well-balanced diet and making lifestyle changes, you can help keep your bones strong and healthy, lowering your chances of developing osteoporosis. What can increase my risk? The following factors may make you more likely to develop osteoporosis:  Having a family history of the condition.  Having poor nutrition or not getting enough calcium or vitamin D.  Using certain medicines, such as steroid medicines or antiseizure medicines.  Being any of the following: ? 26 years of age or older. ? Female. ? A woman who  has gone through menopause (is postmenopausal). ? White (Caucasian) or of Asian descent.  Smoking or having a history of smoking.  Not being physically active (being sedentary).  Having a small body frame. What actions can I take to prevent this?  Get enough calcium   Make sure you get enough calcium every day. Calcium is the most important mineral for bone health. Most people can get enough calcium from their diet, but supplements may be recommended for people who are at risk for osteoporosis. Follow these guidelines: ? If you are age 40 or younger, aim to get 1,000 mg of calcium every day. ? If you are older than age 42, aim to get 1,200 mg of calcium every day.  Good sources of calcium include: ? Dairy products, such as low-fat or nonfat milk, cheese, and yogurt. ? Dark green leafy  vegetables, such as bok choy and broccoli. ? Foods that have had calcium added to them (calcium-fortified foods), such as orange juice, cereal, bread, soy beverages, and tofu products. ? Nuts, such as almonds.  Check nutrition labels to see how much calcium is in a food or drink. Get enough vitamin D  Try to get enough vitamin D every day. Vitamin D is the most essential vitamin for bone health. It helps the body absorb calcium. Follow these guidelines for how much vitamin D to get from food: ? If you are age 69 or younger, aim to get at least 600 international units (IU) every day. Your health care provider may suggest more. ? If you are older than age 74, aim to get at least 800 international units every day. Your health care provider may suggest more.  Good sources of vitamin D in your diet include: ? Egg yolks. ? Oily fish, such as salmon, sardines, and tuna. ? Milk and cereal fortified with vitamin D.  Your body also makes vitamin D when you are out in the sun. Exposing the bare skin on your face, arms, legs, or back to the sun for no more than 30 minutes a day, 2 times a week is more than enough. Beyond that, make sure you use sunblock to protect your skin from sunburn, which increases your risk for skin cancer. Exercise  Stay active and get exercise every day.  Ask your health care provider what types of exercise are best for you. Weight-bearing and strength-building activities are important for building and maintaining healthy bones. Some examples of these types of activities include: ? Walking and hiking. ? Jogging and running. ? Dancing. ? Gym exercises. ? Lifting weights. ? Tennis and racquetball. ? Climbing stairs. ? Aerobics. Make other lifestyle changes  Do not use any products that contain nicotine or tobacco, such as cigarettes, e-cigarettes, and chewing tobacco. If you need help quitting, ask your health care provider.  Lose weight if you are overweight.  If you  drink alcohol: ? Limit how much you use to:  0-1 drink a day for nonpregnant women.  0-2 drinks a day for men. ? Be aware of how much alcohol is in your drink. In the [place].S., one drink equals one 12 oz bottle of beer (355 mL), one 5 oz glass of wine (148 mL), or one 1 oz glass of hard liquor (44 mL). Where to find support If you need help making changes to prevent osteoporosis, talk with your health care provider. You can ask for a referral to a diet and nutrition specialist (dietitian) and a physical therapist. Where to find more  information Learn more about osteoporosis from:  NIH Osteoporosis and Related [place]: www.bones.SouthExposed.es  [place].S. Office on [employer] Health: [place]s.tn  [place]: EquipmentWeekly.com.ee Summary  Osteoporosis is a condition that causes weak bones that are more likely to break.  Eat a healthy diet, making sure you get enough calcium and vitamin D, and stay active by getting regular exercise to help prevent osteoporosis.  Other ways to reduce your risk of osteoporosis include maintaining a healthy weight and avoiding alcohol and products that contain nicotine or tobacco. This information is not intended to replace advice given to you by your health care provider. Make sure you discuss any questions you have with your health care provider. Document Revised: 09/15/2018 Document Reviewed: 09/15/2018 Elsevier Patient Education  [place].

## 2020-02-01 ENCOUNTER — Telehealth: Payer: Self-pay

## 2020-02-01 NOTE — Telephone Encounter (Signed)
Patient called stating that since increasing her tizanidine to 4 mg bid she feels unsteady and has a dry mouth. Per Dr. Tobie Poet these are normal symptoms but suggested that patient take 2 mg in the a.m. and 4 mg in the p.m. Should she still find this intolerable to contact our office and a different muscle relaxer can be sent in. Patient expressed understanding.

## 2020-02-11 ENCOUNTER — Other Ambulatory Visit: Payer: Self-pay

## 2020-02-11 ENCOUNTER — Inpatient Hospital Stay: Payer: Medicare HMO | Attending: Oncology

## 2020-02-11 VITALS — BP 151/66 | HR 69 | Temp 98.1°F | Resp 18 | Ht 67.0 in | Wt 169.4 lb

## 2020-02-11 DIAGNOSIS — C184 Malignant neoplasm of transverse colon: Secondary | ICD-10-CM | POA: Insufficient documentation

## 2020-02-11 DIAGNOSIS — E538 Deficiency of other specified B group vitamins: Secondary | ICD-10-CM | POA: Diagnosis present

## 2020-02-11 MED ORDER — CYANOCOBALAMIN 1000 MCG/ML IJ SOLN
INTRAMUSCULAR | Status: AC
  Filled 2020-02-11: qty 1

## 2020-02-11 MED ORDER — CYANOCOBALAMIN 1000 MCG/ML IJ SOLN
1000.0000 ug | Freq: Once | INTRAMUSCULAR | Status: AC
  Administered 2020-02-11: 1000 ug via INTRAMUSCULAR

## 2020-02-11 NOTE — Progress Notes (Signed)
PT STABLE AT TIME OF DISCHARGE 

## 2020-02-11 NOTE — Patient Instructions (Signed)

## 2020-02-12 DIAGNOSIS — D509 Iron deficiency anemia, unspecified: Secondary | ICD-10-CM | POA: Insufficient documentation

## 2020-02-12 DIAGNOSIS — K219 Gastro-esophageal reflux disease without esophagitis: Secondary | ICD-10-CM | POA: Insufficient documentation

## 2020-02-12 DIAGNOSIS — G459 Transient cerebral ischemic attack, unspecified: Secondary | ICD-10-CM | POA: Insufficient documentation

## 2020-02-12 DIAGNOSIS — C801 Malignant (primary) neoplasm, unspecified: Secondary | ICD-10-CM | POA: Insufficient documentation

## 2020-02-12 DIAGNOSIS — N200 Calculus of kidney: Secondary | ICD-10-CM | POA: Insufficient documentation

## 2020-02-13 ENCOUNTER — Encounter: Payer: Self-pay | Admitting: Cardiology

## 2020-02-13 ENCOUNTER — Ambulatory Visit: Payer: Medicare HMO | Admitting: Cardiology

## 2020-02-13 ENCOUNTER — Other Ambulatory Visit: Payer: Self-pay

## 2020-02-13 VITALS — BP 142/70 | HR 75 | Ht 67.0 in | Wt 172.4 lb

## 2020-02-13 DIAGNOSIS — N1832 Chronic kidney disease, stage 3b: Secondary | ICD-10-CM

## 2020-02-13 DIAGNOSIS — I10 Essential (primary) hypertension: Secondary | ICD-10-CM

## 2020-02-13 DIAGNOSIS — R002 Palpitations: Secondary | ICD-10-CM

## 2020-02-13 NOTE — Patient Instructions (Signed)

## 2020-02-13 NOTE — Progress Notes (Signed)
Cardiology Office Note:    Date:  02/13/2020   ID:  Rebecca Orr, DOB [birth year], MRN [id number]  PCP:  Rochel Brome, MD  Cardiologist:  Jenean Lindau, MD   Referring MD: Rochel Brome, MD    ASSESSMENT:    1. Chronic renal impairment, stage 3b ([place])   2. Essential hypertension   3. Palpitations    PLAN:    In order of problems listed above:  1. Primary prevention stressed with the patient.  Importance of compliance with diet medication stressed and she vocalized understanding.  She is doing her best to ambulate on a regular basis.  She has no symptoms with this. 2. Palpitations: These have resolved and she is happy about it. 3. Essential hypertension: Stable based on lifestyle modifications. 4. Renal insufficiency: Stable.  Managed by primary care physician.  I told her not to take any over-the-counter medications without talking to her primary care doctor. 5. Patient will be seen in follow-up appointment in 6 months or earlier if the patient has any concerns    Medication Adjustments/Labs and Tests Ordered: Current medicines are reviewed at length with the patient today.  Concerns regarding medicines are outlined above.  No orders of the defined types were placed in this encounter.  No orders of the defined types were placed in this encounter.    No chief complaint on file.    History of Present Illness:    Rebecca Orr is a 79 y.o. female.  Patient has past medical history of essential hypertension and renal insufficiency.  She was evaluated by me for palpitations in the past.  She denies any chest pain orthopnea or PND.  She ambulates age appropriately.  At the time of my evaluation, the patient is alert awake oriented and in no distress.  Past Medical History:  Diagnosis Date  . Acute pain of left knee 08/20/2019  . Acute pain of right knee 08/20/2019  . Anemia 07/08/2015  . B12 deficiency 12/13/2019  . Cancer [facility])    Colon  cancer 2017   . Cholecystitis 08/08/2015  . Chronic renal impairment, stage 3b ([place]) 08/20/2019  . Essential hypertension 03/16/2018  . GERD (gastroesophageal reflux disease)   . Heartburn 07/08/2015  . Hematochezia 07/08/2015  . Iron deficiency anemia   . 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 Palpitations 03/16/2018  . Paresthesias 08/20/2019  . Postoperative examination 09/01/2015  . Renal stone   . Transient ischemic attack     Past Surgical History:  Procedure Laterality Date  . CATARACT EXTRACTION    . CHOLECYSTECTOMY    . HEMICOLECTOMY  2017  . SPHINCTEROTOMY  2011    Current Medications: Current Meds  Medication Sig  . aspirin 81 MG EC tablet Take 81 mg by mouth daily. Swallow whole.  . Cyanocobalamin (VITAMIN B-12 IJ) Inject as directed every 30 (thirty) days.  . famotidine (PEPCID) 40 MG tablet Take 40 mg by mouth 2 (two) times daily.  Marland Kitchen tiZANidine (ZANAFLEX) 4 MG tablet Take 1 tablet (4 mg total) by mouth 2 (two) times daily as needed for muscle spasms.  Marland Kitchen UNABLE TO FIND as needed. Hyland's  Leg Cramp     Allergies:   Nsaids and Propranolol   Social History   Socioeconomic History  . Marital status: Widowed    Spouse name: Not on file  . Number of children: Not on file  . Years of education: Not on file  . Highest education level: Not  on file  Occupational History  . Not on file  Tobacco Use  . Smoking status: Never Smoker  . Smokeless tobacco: Never Used  Vaping Use  . Vaping Use: Never used  Substance and Sexual Activity  . Alcohol use: Never  . Drug use: Never  . Sexual activity: Not on file  Other Topics Concern  . Not on file  Social History Narrative  . Not on file   Social Determinants of Health   Financial Resource Strain: Not on file  Food Insecurity: Not on file  Transportation Needs: Not on file  Physical Activity: Not on file  Stress: Not on file  Social Connections: Not on file     Family History: The patient's  family history includes Dementia in her mother; Heart disease in her father; Lung cancer in her father; Prostate cancer in her brother; Renal cancer in her sister.  ROS:   Please see the history of present illness.    All other systems reviewed and are negative.  EKGs/Labs/Other Studies Reviewed:    The following studies were reviewed today: EKG reveals sinus rhythm and nonspecific ST-T changes   Recent Labs: 08/20/2019: TSH 2.250 12/26/2019: Magnesium 2.2 01/14/2020: ALT 15; BUN 21; Creatinine 1.64; Hemoglobin 10.8; Platelet Count 178; Potassium 4.1; Sodium 142  Recent Lipid Panel    Component Value Date/Time   CHOL 196 08/20/2019 0934   TRIG 105 08/20/2019 0934   HDL 62 08/20/2019 0934   CHOLHDL 3.2 08/20/2019 0934   LDLCALC 115 (H) 08/20/2019 0934    Physical Exam:    VS:  BP (!) 142/70   Pulse 75   Ht 5\' 7"  (1.702 m)   Wt 172 lb 6.4 oz (78.2 kg)   SpO2 98%   BMI 27.00 kg/m     Wt Readings from Last 3 Encounters:  02/13/20 172 lb 6.4 oz (78.2 kg)  02/11/20 169 lb 7 oz (76.9 kg)  01/30/20 171 lb (77.6 kg)     GEN: Patient is in no acute distress HEENT: Normal NECK: No JVD; No carotid bruits LYMPHATICS: No lymphadenopathy CARDIAC: Hear sounds regular, 2/6 systolic murmur at the apex. RESPIRATORY:  Clear to auscultation without rales, wheezing or rhonchi  ABDOMEN: Soft, non-tender, non-distended MUSCULOSKELETAL:  No edema; No deformity  SKIN: Warm and dry NEUROLOGIC:  Alert and oriented x 3 PSYCHIATRIC:  Normal affect   Signed, Jenean Lindau, MD  02/13/2020 3:27 PM    [place]

## 2020-02-14 DIAGNOSIS — R69 Illness, unspecified: Secondary | ICD-10-CM | POA: Diagnosis not present

## 2020-02-19 ENCOUNTER — Telehealth: Payer: Self-pay

## 2020-02-19 NOTE — Telephone Encounter (Signed)
Last labs from 01/14/2020 and Jan, Feb, March 2022 appts. Will send out for mail today

## 2020-02-25 ENCOUNTER — Encounter: Payer: Self-pay | Admitting: Family Medicine

## 2020-02-25 ENCOUNTER — Other Ambulatory Visit: Payer: Self-pay | Admitting: Family Medicine

## 2020-02-25 ENCOUNTER — Other Ambulatory Visit: Payer: Self-pay

## 2020-02-25 ENCOUNTER — Ambulatory Visit (INDEPENDENT_AMBULATORY_CARE_PROVIDER_SITE_OTHER): Payer: Medicare HMO | Admitting: Family Medicine

## 2020-02-25 VITALS — BP 110/60 | HR 80 | Temp 97.2°F | Ht 67.0 in | Wt 165.2 lb

## 2020-02-25 DIAGNOSIS — M62838 Other muscle spasm: Secondary | ICD-10-CM | POA: Diagnosis not present

## 2020-02-25 DIAGNOSIS — R600 Localized edema: Secondary | ICD-10-CM

## 2020-02-25 DIAGNOSIS — R233 Spontaneous ecchymoses: Secondary | ICD-10-CM | POA: Diagnosis not present

## 2020-02-25 MED ORDER — METHOCARBAMOL 500 MG PO TABS: 500.0000 mg | ORAL_TABLET | Freq: Three times a day (TID) | ORAL | 1 refills | Status: DC | PRN

## 2020-02-25 NOTE — Patient Instructions (Signed)
Stop tizanidine.  Start on robaxin 500 mg one three times a day as needed for muscle spasm.  Decrease aspirin 81 mg one every other day.  Elevate legs.  Compression stockings. (goal 28-30, but may only tolerate 18-20) Avoid salt.

## 2020-02-25 NOTE — Progress Notes (Signed)
Acute Office Visit  Subjective:    Patient ID: Rebecca Orr, female    DOB: Jul 24, 1940, 79 y.o.   MRN: [id number]  Chief Complaint  Patient presents with  . Edema    HPI Patient is in today for swelling of BL lower feet. Pt was on feet over the last few days. Denies increase in salt in diet.  Spontaneous bruising on arms and legs. Takes baby aspirin once daily   Pt was given tizanidine for muscle spasms. Not helping, but makes mouth dry and makes her feel off balance. Feet burn from knees down.   Past Medical History:  Diagnosis Date  . Acute pain of left knee 08/20/2019  . Acute pain of right knee 08/20/2019  . Anemia 07/08/2015  . B12 deficiency 12/13/2019  . Cancer [facility])    Colon cancer 2017   . Cholecystitis 08/08/2015  . Chronic renal impairment, stage 3b (HCC) 08/20/2019  . Essential hypertension 03/16/2018  . GERD (gastroesophageal reflux disease)   . Heartburn 07/08/2015  . Hematochezia 07/08/2015  . Iron deficiency anemia   . Malignant neoplasm of transverse colon (HCC) 07/22/2015  . Palpitations 03/16/2018  . Paresthesias 08/20/2019  . Postoperative examination 09/01/2015  . Renal stone   . Transient ischemic attack     Past Surgical History:  Procedure Laterality Date  . CATARACT EXTRACTION    . CHOLECYSTECTOMY    . HEMICOLECTOMY  2017  . SPHINCTEROTOMY  2011    Family History  Problem Relation Age of Onset  . Dementia Mother   . Lung cancer Father   . Heart disease Father   . Renal cancer Sister   . Prostate cancer Brother     Social History   Socioeconomic History  . Marital status: Widowed    Spouse name: Not on file  . Number of children: Not on file  . Years of education: Not on file  . Highest education level: Not on file  Occupational History  . Not on file  Tobacco Use  . Smoking status: Never Smoker  . Smokeless tobacco: Never Used  Vaping Use  . Vaping Use: Never used  Substance and Sexual Activity  . Alcohol use: Never   . Drug use: Never  . Sexual activity: Not on file  Other Topics Concern  . Not on file  Social History Narrative  . Not on file   Social Determinants of Health   Financial Resource Strain: Not on file  Food Insecurity: Not on file  Transportation Needs: Not on file  Physical Activity: Not on file  Stress: Not on file  Social Connections: Not on file  Intimate Partner Violence: Not on file    Outpatient Medications Prior to Visit  Medication Sig Dispense Refill  . aspirin 81 MG EC tablet Take 81 mg by mouth daily. Swallow whole.    . chlorhexidine (PERIDEX) 0.12 % solution Use as directed 15 mLs in the mouth or throat 2 (two) times daily.    . Cyanocobalamin (VITAMIN B-12 IJ) Inject as directed every 30 (thirty) days.    . famotidine (PEPCID) 40 MG tablet Take 40 mg by mouth 2 (two) times daily.    . penicillin v potassium (VEETID) 500 MG tablet Take 500 mg by mouth 4 (four) times daily.    Marland Kitchen tiZANidine (ZANAFLEX) 4 MG tablet Take 1 tablet (4 mg total) by mouth 2 (two) times daily as needed for muscle spasms. (Patient taking differently: Take 2 mg by mouth 2 (two)  times daily as needed for muscle spasms.) 60 tablet 2  . UNABLE TO FIND as needed. Hyland's  Leg Cramp     No facility-administered medications prior to visit.    Allergies  Allergen Reactions  . Nsaids Other (See Comments)    GI Bleed  . Propranolol Other (See Comments)    Unknown    Review of Systems  Constitutional: Negative for chills, fatigue and fever.  HENT: Negative for congestion, rhinorrhea and sore throat.   Respiratory: Negative for cough and shortness of breath.   Cardiovascular: Positive for leg swelling. Negative for chest pain and palpitations.  Gastrointestinal: Negative for abdominal pain, constipation, diarrhea, nausea and vomiting.  Genitourinary: Positive for frequency. Negative for dysuria and urgency.  Musculoskeletal: Negative for back pain and myalgias.  Neurological: Positive for  light-headedness. Negative for dizziness, weakness and headaches.  Psychiatric/Behavioral: Negative for dysphoric mood. The patient is not nervous/anxious.        Objective:    Physical Exam Constitutional:      Appearance: Normal appearance.  Cardiovascular:     Rate and Rhythm: Normal rate and regular rhythm.     Heart sounds: Normal heart sounds.  Pulmonary:     Effort: Pulmonary effort is normal.     Breath sounds: Normal breath sounds.  Musculoskeletal:     Right lower leg: Edema (trace) present.     Left lower leg: Edema (trace) present.  Neurological:     Mental Status: She is alert and oriented to person, place, and time.  Psychiatric:        Mood and Affect: Mood normal.        Behavior: Behavior normal.    BP 110/60   Pulse 80   Temp (!) 97.2 F (36.2 C)   Ht 5\' 7"  (1.702 m)   Wt 165 lb 3.2 oz (74.9 kg)   BMI 25.87 kg/m  Wt Readings from Last 3 Encounters:  02/25/20 165 lb 3.2 oz (74.9 kg)  02/13/20 172 lb 6.4 oz (78.2 kg)  02/11/20 169 lb 7 oz (76.9 kg)    Health Maintenance Due  Topic Date Due  . Hepatitis C Screening  Never done  . COVID-19 Vaccine (3 - Moderna risk 4-dose series) 06/27/2019    There are no preventive care reminders to display for this patient.   Lab Results  Component Value Date   TSH 2.250 08/20/2019   Lab Results  Component Value Date   WBC 5.7 01/14/2020   HGB 10.8 (L) 01/14/2020   HCT 34.0 (L) 01/14/2020   MCV 96.3 01/14/2020   PLT 178 01/14/2020   Lab Results  Component Value Date   NA 142 01/14/2020   K 4.1 01/14/2020   CO2 27 01/14/2020   GLUCOSE 104 (H) 01/14/2020   BUN 21 01/14/2020   CREATININE 1.64 (H) 01/14/2020   BILITOT 0.4 01/14/2020   ALKPHOS 48 01/14/2020   AST 22 01/14/2020   ALT 15 01/14/2020   PROT 6.6 01/14/2020   ALBUMIN 3.9 01/14/2020   CALCIUM 9.3 01/14/2020   ANIONGAP 7 01/14/2020   Lab Results  Component Value Date   CHOL 196 08/20/2019   Lab Results  Component Value Date   HDL  62 08/20/2019   Lab Results  Component Value Date   LDLCALC 115 (H) 08/20/2019   Lab Results  Component Value Date   TRIG 105 08/20/2019   Lab Results  Component Value Date   CHOLHDL 3.2 08/20/2019   No results found for: HGBA1C  Assessment & Plan:  1. Pedal edema 2. Muscle spasm 3. Spontaneous bruising   Stop tizanidine.  Start on robaxin 500 mg one three times a day as needed for muscle spasm.  Decrease aspirin 81 mg one every other day.  Elevate legs.  Compression stockings. (goal 28-30, but may only tolerate 18-20) Avoid salt.   Meds ordered this encounter  Medications  . methocarbamol (ROBAXIN) 500 MG tablet    Sig: Take 1 tablet (500 mg total) by mouth every 8 (eight) hours as needed for muscle spasms.    Dispense:  90 tablet    Refill:  1   Follow-up: Return if symptoms worsen or fail to improve.  An After Visit Summary was printed and given to the patient.  Rochel Brome, MD [facility] [phone]

## 2020-02-26 ENCOUNTER — Other Ambulatory Visit: Payer: Self-pay | Admitting: Family Medicine

## 2020-02-26 NOTE — Progress Notes (Signed)
PT STABLE AT TIME OF DISCHARGE 

## 2020-02-27 ENCOUNTER — Other Ambulatory Visit: Payer: Self-pay

## 2020-02-27 ENCOUNTER — Telehealth: Payer: Self-pay

## 2020-02-27 MED ORDER — CYCLOBENZAPRINE HCL 5 MG PO TABS: 5.0000 mg | ORAL_TABLET | Freq: Three times a day (TID) | ORAL | 0 refills | Status: DC | PRN

## 2020-02-27 NOTE — Telephone Encounter (Signed)
PA submitted for cyclobenzaprine and approved via [email or web address]

## 2020-03-07 ENCOUNTER — Other Ambulatory Visit: Payer: Self-pay | Admitting: Pharmacist

## 2020-03-10 ENCOUNTER — Inpatient Hospital Stay: Payer: Medicare HMO | Attending: Oncology

## 2020-03-10 ENCOUNTER — Other Ambulatory Visit: Payer: Self-pay

## 2020-03-10 VITALS — BP 148/60 | HR 84 | Temp 98.1°F | Resp 18 | Wt 169.0 lb

## 2020-03-10 DIAGNOSIS — E538 Deficiency of other specified B group vitamins: Secondary | ICD-10-CM | POA: Insufficient documentation

## 2020-03-10 DIAGNOSIS — C184 Malignant neoplasm of transverse colon: Secondary | ICD-10-CM | POA: Diagnosis not present

## 2020-03-10 MED ORDER — CYANOCOBALAMIN 1000 MCG/ML IJ SOLN
INTRAMUSCULAR | Status: AC
  Filled 2020-03-10: qty 1

## 2020-03-10 MED ORDER — CYANOCOBALAMIN 1000 MCG/ML IJ SOLN
1000.0000 ug | Freq: Once | INTRAMUSCULAR | Status: AC
  Administered 2020-03-10: 1000 ug via INTRAMUSCULAR

## 2020-03-10 NOTE — Patient Instructions (Signed)

## 2020-03-31 ENCOUNTER — Telehealth: Payer: Self-pay

## 2020-03-31 NOTE — Telephone Encounter (Signed)
You may go ahead and order. kc

## 2020-03-31 NOTE — Telephone Encounter (Signed)
Dr. Tobie Poet and nurses please advise.  Pt called stating it was time to get her yearly mammogram done an was wondering if she could get a order to have that done since she had a yearly in December.

## 2020-04-01 ENCOUNTER — Telehealth: Payer: Self-pay

## 2020-04-01 DIAGNOSIS — Z1231 Encounter for screening mammogram for malignant neoplasm of breast: Secondary | ICD-10-CM

## 2020-04-01 NOTE — Telephone Encounter (Signed)
Mammogram ordered

## 2020-04-01 NOTE — Telephone Encounter (Signed)
Mammogram order per Dr. Tobie Poet.

## 2020-04-07 ENCOUNTER — Inpatient Hospital Stay: Payer: Medicare HMO | Attending: Oncology

## 2020-04-07 ENCOUNTER — Other Ambulatory Visit: Payer: Self-pay

## 2020-04-07 VITALS — BP 147/61 | HR 74 | Temp 97.8°F | Resp 18 | Ht 67.0 in | Wt 169.0 lb

## 2020-04-07 DIAGNOSIS — E538 Deficiency of other specified B group vitamins: Secondary | ICD-10-CM | POA: Diagnosis not present

## 2020-04-07 DIAGNOSIS — C184 Malignant neoplasm of transverse colon: Secondary | ICD-10-CM | POA: Diagnosis not present

## 2020-04-07 MED ORDER — CYANOCOBALAMIN 1000 MCG/ML IJ SOLN
1000.0000 ug | Freq: Once | INTRAMUSCULAR | Status: AC
  Administered 2020-04-07: 1000 ug via INTRAMUSCULAR

## 2020-04-07 MED ORDER — CYANOCOBALAMIN 1000 MCG/ML IJ SOLN
INTRAMUSCULAR | Status: AC
  Filled 2020-04-07: qty 1

## 2020-04-07 NOTE — Patient Instructions (Signed)

## 2020-04-22 ENCOUNTER — Other Ambulatory Visit: Payer: Self-pay

## 2020-04-22 ENCOUNTER — Encounter: Payer: Self-pay | Admitting: Family Medicine

## 2020-04-22 ENCOUNTER — Ambulatory Visit (INDEPENDENT_AMBULATORY_CARE_PROVIDER_SITE_OTHER): Payer: Medicare HMO | Admitting: Family Medicine

## 2020-04-22 VITALS — BP 134/78 | HR 79 | Temp 97.3°F | Ht 67.0 in | Wt 171.0 lb

## 2020-04-22 DIAGNOSIS — Z85038 Personal history of other malignant neoplasm of large intestine: Secondary | ICD-10-CM

## 2020-04-22 DIAGNOSIS — R10812 Left upper quadrant abdominal tenderness: Secondary | ICD-10-CM

## 2020-04-22 DIAGNOSIS — K5792 Diverticulitis of intestine, part unspecified, without perforation or abscess without bleeding: Secondary | ICD-10-CM

## 2020-04-22 DIAGNOSIS — R1012 Left upper quadrant pain: Secondary | ICD-10-CM | POA: Diagnosis not present

## 2020-04-22 DIAGNOSIS — R109 Unspecified abdominal pain: Secondary | ICD-10-CM | POA: Diagnosis not present

## 2020-04-22 NOTE — Progress Notes (Signed)
Subjective:  Patient ID: Rebecca Orr, female    DOB: 1940/10/07  Age: 80 y.o. MRN: [id number]  Chief Complaint  Patient presents with  . Abdominal Pain    HPI Abdominal pain LUQ x 1 week. Sharp pain that comes and goes with some tenderness at times. BM have been soft but not loose, has been having at least 2 BM's daily. Typically has to take fiber, however has not taken any in the last week but has been eating a lot of nuts (cashews). No nausea or vomiting. Pain 3-4/10. Hurts with movement. Not triggered by food. Does not awake from sleep. Appetite is normal. Lasts less than 5 minutes.   Current Outpatient Medications on File Prior to Visit  Medication Sig Dispense Refill  . aspirin 81 MG EC tablet Take 81 mg by mouth every other day. Swallow whole.    . Cyanocobalamin (VITAMIN B-12 IJ) Inject as directed every 30 (thirty) days.    . famotidine (PEPCID) 40 MG tablet Take 40 mg by mouth 2 (two) times daily.    Marland Kitchen UNABLE TO FIND as needed. Hyland's  Leg Cramp     No current facility-administered medications on file prior to visit.   Past Medical History:  Diagnosis Date  . Acute pain of left knee 08/20/2019  . Acute pain of right knee 08/20/2019  . Anemia 07/08/2015  . B12 deficiency 12/13/2019  . Cancer [facility])    Colon cancer 2017   . Cholecystitis 08/08/2015  . Chronic renal impairment, stage 3b ([place]) 08/20/2019  . Essential hypertension 03/16/2018  . GERD (gastroesophageal reflux disease)   . Heartburn 07/08/2015  . Hematochezia 07/08/2015  . Iron deficiency anemia   . Malignant neoplasm of transverse colon ([place]) 07/22/2015  . Palpitations 03/16/2018  . Paresthesias 08/20/2019  . Postoperative examination 09/01/2015  . Renal stone   . Transient ischemic attack    Past Surgical History:  Procedure Laterality Date  . CATARACT EXTRACTION    . CHOLECYSTECTOMY    . HEMICOLECTOMY  2017  . SPHINCTEROTOMY  2011    Family History  Problem Relation Age of Onset  . Dementia  Mother   . Lung cancer Father   . Heart disease Father   . Renal cancer Sister   . Prostate cancer Brother   . Breast cancer Paternal Grandmother    Social History   Socioeconomic History  . Marital status: Widowed    Spouse name: Not on file  . Number of children: Not on file  . Years of education: Not on file  . Highest education level: Not on file  Occupational History  . Not on file  Tobacco Use  . Smoking status: Never Smoker  . Smokeless tobacco: Never Used  Vaping Use  . Vaping Use: Never used  Substance and Sexual Activity  . Alcohol use: Never  . Drug use: Never  . Sexual activity: Not on file  Other Topics Concern  . Not on file  Social History Narrative  . Not on file   Social Determinants of Health   Financial Resource Strain: Not on file  Food Insecurity: Not on file  Transportation Needs: Not on file  Physical Activity: Not on file  Stress: Not on file  Social Connections: Not on file    Review of Systems  Constitutional: Negative for chills, fatigue and fever.  HENT: Positive for rhinorrhea. Negative for congestion, ear pain, postnasal drip, sinus pressure, sinus pain and sore throat.   Respiratory: Negative for  cough and shortness of breath.   Cardiovascular: Negative for chest pain.  Gastrointestinal: Positive for diarrhea. Negative for nausea.  Neurological: Negative for dizziness and headaches.     Objective:  BP 134/78   Pulse 79   Temp (!) 97.3 F (36.3 C)   Ht 5\' 7"  (1.702 m)   Wt 171 lb (77.6 kg)   SpO2 98%   BMI 26.78 kg/m   BP/Weight 05/05/2020 1/93/7902 4/0/9735  Systolic BP 329 924 268  Diastolic BP 57 78 61  Wt. (Lbs) 164.25 171 169  BMI 25.73 26.78 26.47    Physical Exam Vitals reviewed.  Constitutional:      Appearance: She is well-developed.  Cardiovascular:     Rate and Rhythm: Normal rate and regular rhythm.     Heart sounds: Normal heart sounds.  Pulmonary:     Effort: Pulmonary effort is normal.     Breath  sounds: Normal breath sounds.  Abdominal:     General: Bowel sounds are normal.     Palpations: Abdomen is soft.     Tenderness: There is abdominal tenderness in the left upper quadrant. There is no guarding or rebound.     Hernia: There is no hernia in the umbilical area or ventral area.  Neurological:     Mental Status: She is alert.     Diabetic Foot Exam - Simple   No data filed      Lab Results  Component Value Date   WBC 5.7 01/14/2020   HGB 10.8 (L) 01/14/2020   HCT 34.0 (L) 01/14/2020   PLT 178 01/14/2020   GLUCOSE 105 (H) 05/06/2020   CHOL 196 08/20/2019   TRIG 105 08/20/2019   HDL 62 08/20/2019   LDLCALC 115 (H) 08/20/2019   ALT 10 05/06/2020   AST 23 05/06/2020   NA 142 05/06/2020   K 4.8 05/06/2020   CL 104 05/06/2020   CREATININE 1.38 (H) 05/06/2020   BUN 15 05/06/2020   CO2 22 05/06/2020   TSH 2.250 08/20/2019      Assessment & Plan:   1. Left upper quadrant abdominal tenderness without rebound tenderness Order ct scan of abdomen/pelvis with contrast, cbc, cmp.  - CBC with Differential/Platelet - Comprehensive metabolic panel - CT Abdomen Pelvis W Contrast  2. History of colon cancer Check ct of abd/pelvis to be sure no recurrent cancer.  3. Diverticulitis CT of abdomen and pelvis with contrast showed diverticulitis.  Rx: flagyl and cipro.  - metroNIDAZOLE (FLAGYL) 500 MG tablet; Take 1 tablet (500 mg total) by mouth 2 (two) times daily.  Dispense: 14 tablet; Refill: 0 - ciprofloxacin (CIPRO) 250 MG tablet; Take 1 tablet (250 mg total) by mouth 2 (two) times daily.  Dispense: 14 tablet; Refill: 0   Meds ordered this encounter  Medications  . DISCONTD: ciprofloxacin (CIPRO) 500 MG tablet    Sig: Take 1 tablet (500 mg total) by mouth 2 (two) times daily.    Dispense:  14 tablet    Refill:  0  . metroNIDAZOLE (FLAGYL) 500 MG tablet    Sig: Take 1 tablet (500 mg total) by mouth 2 (two) times daily.    Dispense:  14 tablet    Refill:  0  .  ciprofloxacin (CIPRO) 250 MG tablet    Sig: Take 1 tablet (250 mg total) by mouth 2 (two) times daily.    Dispense:  14 tablet    Refill:  0    Cancel previous cipro rx sent last night.  Orders Placed This Encounter  Procedures  . CT Abdomen Pelvis W Contrast  . CBC with Differential/Platelet  . Comprehensive metabolic panel     Follow-up: Return if symptoms worsen or fail to improve.  An After Visit Summary was printed and given to the patient.  Rochel Brome, MD  Family Practice [phone]

## 2020-04-23 ENCOUNTER — Other Ambulatory Visit: Payer: Self-pay

## 2020-04-23 DIAGNOSIS — R944 Abnormal results of kidney function studies: Secondary | ICD-10-CM

## 2020-04-23 MED ORDER — CIPROFLOXACIN HCL 500 MG PO TABS: 500.0000 mg | ORAL_TABLET | Freq: Two times a day (BID) | ORAL | 0 refills | Status: DC

## 2020-04-23 MED ORDER — CIPROFLOXACIN HCL 250 MG PO TABS
250.0000 mg | ORAL_TABLET | Freq: Two times a day (BID) | ORAL | 0 refills | Status: DC
Start: 2020-04-23 — End: 2020-05-20

## 2020-04-23 MED ORDER — METRONIDAZOLE 500 MG PO TABS: 500.0000 mg | ORAL_TABLET | Freq: Two times a day (BID) | ORAL | 0 refills | Status: DC

## 2020-04-25 ENCOUNTER — Other Ambulatory Visit: Payer: Self-pay | Admitting: Family Medicine

## 2020-04-25 DIAGNOSIS — Z1231 Encounter for screening mammogram for malignant neoplasm of breast: Secondary | ICD-10-CM

## 2020-04-28 ENCOUNTER — Other Ambulatory Visit: Payer: Self-pay

## 2020-04-28 ENCOUNTER — Ambulatory Visit
Admission: RE | Admit: 2020-04-28 | Discharge: 2020-04-28 | Disposition: A | Discharge status: Home / Self Care | Payer: Medicare HMO | Source: Ambulatory Visit | Attending: Family Medicine | Admitting: Family Medicine

## 2020-04-28 DIAGNOSIS — Z1231 Encounter for screening mammogram for malignant neoplasm of breast: Secondary | ICD-10-CM

## 2020-04-29 DIAGNOSIS — D3131 Benign neoplasm of right choroid: Secondary | ICD-10-CM | POA: Diagnosis not present

## 2020-04-29 DIAGNOSIS — Z9841 Cataract extraction status, right eye: Secondary | ICD-10-CM | POA: Diagnosis not present

## 2020-04-29 DIAGNOSIS — H353131 Nonexudative age-related macular degeneration, bilateral, early dry stage: Secondary | ICD-10-CM | POA: Diagnosis not present

## 2020-04-29 DIAGNOSIS — H524 Presbyopia: Secondary | ICD-10-CM | POA: Diagnosis not present

## 2020-04-29 DIAGNOSIS — Z961 Presence of intraocular lens: Secondary | ICD-10-CM | POA: Diagnosis not present

## 2020-04-29 DIAGNOSIS — H5203 Hypermetropia, bilateral: Secondary | ICD-10-CM | POA: Diagnosis not present

## 2020-04-29 DIAGNOSIS — Z9842 Cataract extraction status, left eye: Secondary | ICD-10-CM | POA: Diagnosis not present

## 2020-04-29 DIAGNOSIS — H52223 Regular astigmatism, bilateral: Secondary | ICD-10-CM | POA: Diagnosis not present

## 2020-04-29 DIAGNOSIS — H35373 Puckering of macula, bilateral: Secondary | ICD-10-CM | POA: Diagnosis not present

## 2020-05-05 ENCOUNTER — Inpatient Hospital Stay: Payer: Medicare HMO | Attending: Oncology

## 2020-05-05 ENCOUNTER — Other Ambulatory Visit: Payer: Self-pay

## 2020-05-05 VITALS — BP 139/57 | HR 77 | Temp 97.9°F | Resp 18 | Ht 67.0 in | Wt 164.2 lb

## 2020-05-05 DIAGNOSIS — E538 Deficiency of other specified B group vitamins: Secondary | ICD-10-CM | POA: Insufficient documentation

## 2020-05-05 DIAGNOSIS — R202 Paresthesia of skin: Secondary | ICD-10-CM | POA: Diagnosis not present

## 2020-05-05 DIAGNOSIS — R200 Anesthesia of skin: Secondary | ICD-10-CM | POA: Diagnosis not present

## 2020-05-05 DIAGNOSIS — Z79899 Other long term (current) drug therapy: Secondary | ICD-10-CM | POA: Diagnosis not present

## 2020-05-05 DIAGNOSIS — R252 Cramp and spasm: Secondary | ICD-10-CM | POA: Diagnosis not present

## 2020-05-05 MED ORDER — CYANOCOBALAMIN 1000 MCG/ML IJ SOLN
1000.0000 ug | Freq: Once | INTRAMUSCULAR | Status: AC
  Administered 2020-05-05: 1000 ug via INTRAMUSCULAR

## 2020-05-05 MED ORDER — CYANOCOBALAMIN 1000 MCG/ML IJ SOLN
INTRAMUSCULAR | Status: AC
  Filled 2020-05-05: qty 1

## 2020-05-05 NOTE — Patient Instructions (Signed)

## 2020-05-06 ENCOUNTER — Other Ambulatory Visit: Payer: Medicare HMO

## 2020-05-06 DIAGNOSIS — R944 Abnormal results of kidney function studies: Secondary | ICD-10-CM

## 2020-05-07 LAB — COMPREHENSIVE METABOLIC PANEL
ALT: 10 IU/L (ref 0–32)
AST: 23 IU/L (ref 0–40)
Albumin/Globulin Ratio: 1.7 (ref 1.2–2.2)
Albumin: 4.4 g/dL (ref 3.7–4.7)
Alkaline Phosphatase: 77 IU/L (ref 44–121)
BUN/Creatinine Ratio: 11 — ABNORMAL LOW (ref 12–28)
BUN: 15 mg/dL (ref 8–27)
Bilirubin Total: 0.3 mg/dL (ref 0.0–1.2)
CO2: 22 mmol/L (ref 20–29)
Calcium: 9.4 mg/dL (ref 8.7–10.3)
Chloride: 104 mmol/L (ref 96–106)
Creatinine, Ser: 1.38 mg/dL — ABNORMAL HIGH (ref 0.57–1.00)
Globulin, Total: 2.6 g/dL (ref 1.5–4.5)
Glucose: 105 mg/dL — ABNORMAL HIGH (ref 65–99)
Potassium: 4.8 mmol/L (ref 3.5–5.2)
Sodium: 142 mmol/L (ref 134–144)
Total Protein: 7 g/dL (ref 6.0–8.5)
eGFR: 39 mL/min/{1.73_m2} — ABNORMAL LOW (ref >59)

## 2020-05-12 DIAGNOSIS — K5730 Diverticulosis of large intestine without perforation or abscess without bleeding: Secondary | ICD-10-CM | POA: Diagnosis not present

## 2020-05-12 DIAGNOSIS — Z85048 Personal history of other malignant neoplasm of rectum, rectosigmoid junction, and anus: Secondary | ICD-10-CM | POA: Diagnosis not present

## 2020-05-12 DIAGNOSIS — K648 Other hemorrhoids: Secondary | ICD-10-CM | POA: Diagnosis not present

## 2020-05-20 ENCOUNTER — Ambulatory Visit (INDEPENDENT_AMBULATORY_CARE_PROVIDER_SITE_OTHER): Payer: Medicare HMO | Admitting: Physician Assistant

## 2020-05-20 ENCOUNTER — Encounter: Payer: Self-pay | Admitting: Physician Assistant

## 2020-05-20 ENCOUNTER — Other Ambulatory Visit: Payer: Self-pay

## 2020-05-20 VITALS — BP 130/68 | HR 86 | Temp 97.0°F | Ht 67.0 in | Wt 171.8 lb

## 2020-05-20 DIAGNOSIS — R5381 Other malaise: Secondary | ICD-10-CM | POA: Diagnosis not present

## 2020-05-20 DIAGNOSIS — M79606 Pain in leg, unspecified: Secondary | ICD-10-CM

## 2020-05-20 DIAGNOSIS — L309 Dermatitis, unspecified: Secondary | ICD-10-CM

## 2020-05-20 HISTORY — DX: Pain in leg, unspecified: M79.606

## 2020-05-20 HISTORY — DX: Dermatitis, unspecified: L30.9

## 2020-05-20 MED ORDER — TRIAMCINOLONE ACETONIDE 40 MG/ML IJ SUSP: 60.0000 mg | Freq: Once | INTRAMUSCULAR | Status: DC

## 2020-05-20 NOTE — Progress Notes (Signed)
Acute Office Visit  Subjective:    Patient ID: Rebecca Orr, female    DOB: Aug 23, 1940, 80 y.o.   MRN: [id number]  Chief Complaint  Patient presents with  . Allergic Reaction    HPI Patient is in today for rash Pt states that for the past 2-3 months she gets an itchy rash -- usually on her chest and at times on her stomach and upper legs Today she only has a small patch on her upper chest Cannot recall any recent new lotions,soaps,meds,wash,etc  Pt also mentions today that she has had leg pains for over 2 years and wants to know why - I see several times she has discussed with Dr Tobie Poet and treatment given She is wearing compression socks which helps It is noted she has not had labwork to include cbc recently - will obtain and follow up according to those results first  Past Medical History:  Diagnosis Date  . Acute pain of left knee 08/20/2019  . Acute pain of right knee 08/20/2019  . Anemia 07/08/2015  . B12 deficiency 12/13/2019  . Cancer [facility])    Colon cancer 2017   . Cholecystitis 08/08/2015  . Chronic renal impairment, stage 3b ([place]) 08/20/2019  . Essential hypertension 03/16/2018  . GERD (gastroesophageal reflux disease)   . Heartburn 07/08/2015  . Hematochezia 07/08/2015  . Iron deficiency anemia   . Malignant neoplasm of transverse colon ([place]) 07/22/2015  . Palpitations 03/16/2018  . Paresthesias 08/20/2019  . Postoperative examination 09/01/2015  . Renal stone   . Transient ischemic attack     Past Surgical History:  Procedure Laterality Date  . CATARACT EXTRACTION    . CHOLECYSTECTOMY    . HEMICOLECTOMY  2017  . SPHINCTEROTOMY  2011    Family History  Problem Relation Age of Onset  . Dementia Mother   . Lung cancer Father   . Heart disease Father   . Renal cancer Sister   . Prostate cancer Brother   . Breast cancer Paternal Grandmother     Social History   Socioeconomic History  . Marital status: Widowed    Spouse name: Not on file  . Number  of children: Not on file  . Years of education: Not on file  . Highest education level: Not on file  Occupational History  . Not on file  Tobacco Use  . Smoking status: Never Smoker  . Smokeless tobacco: Never Used  Vaping Use  . Vaping Use: Never used  Substance and Sexual Activity  . Alcohol use: Never  . Drug use: Never  . Sexual activity: Not on file  Other Topics Concern  . Not on file  Social History Narrative  . Not on file   Social Determinants of Health   Financial Resource Strain: Not on file  Food Insecurity: Not on file  Transportation Needs: Not on file  Physical Activity: Not on file  Stress: Not on file  Social Connections: Not on file  Intimate Partner Violence: Not on file    Outpatient Medications Prior to Visit  Medication Sig Dispense Refill  . aspirin 81 MG EC tablet Take 81 mg by mouth every other day. Swallow whole.    . Cyanocobalamin (VITAMIN B-12 IJ) Inject as directed every 30 (thirty) days.    . famotidine (PEPCID) 40 MG tablet Take 40 mg by mouth 2 (two) times daily.    . ciprofloxacin (CIPRO) 250 MG tablet Take 1 tablet (250 mg total) by mouth 2 (two)  times daily. 14 tablet 0  . metroNIDAZOLE (FLAGYL) 500 MG tablet Take 1 tablet (500 mg total) by mouth 2 (two) times daily. 14 tablet 0  . UNABLE TO FIND as needed. Hyland's  Leg Cramp     No facility-administered medications prior to visit.    Allergies  Allergen Reactions  . Nsaids Other (See Comments)    GI Bleed  . Asa [Aspirin]   . Atenolol   . Naproxen   . Propranolol Other (See Comments)    Unknown  . Tizanidine     Dizziness    Review of Systems CONSTITUTIONAL: Negative for chills, fatigue, fever, unintentional weight gain and unintentional weight loss.  E/N/T: Negative for ear pain, nasal congestion and sore throat.  CARDIOVASCULAR: Negative for chest pain, dizziness, palpitations and pedal edema.  RESPIRATORY: Negative for recent cough and dyspnea.  GASTROINTESTINAL:  Negative for abdominal pain, acid reflux symptoms, constipation, diarrhea, nausea and vomiting.  MSK: see HPI INTEGUMENTARY: see HPI       Objective:    Physical Exam PHYSICAL EXAM:   VS: BP 130/68 (BP Location: Right Arm, Patient Position: Sitting, Cuff Size: Normal)   Pulse 86   Temp (!) 97 F (36.1 C) (Temporal)   Ht 5\' 7"  (1.702 m)   Wt 171 lb 12.8 oz (77.9 kg)   SpO2 98%   BMI 26.91 kg/m   GEN: Well nourished, well developed, in no acute distress  Cardiac: RRR; no murmurs, rubs, or gallops,no edema - Respiratory:  normal respiratory rate and pattern with no distress - normal breath sounds with no rales, rhonchi, wheezes or rubs Skin: warm and dry, small area on upper chest of red papules -- no involvement on rest of body but she states earlier was on abdomen and legs and still feels pruritic EXT- no edema or rash noted BP 130/68 (BP Location: Right Arm, Patient Position: Sitting, Cuff Size: Normal)   Pulse 86   Temp (!) 97 F (36.1 C) (Temporal)   Ht 5\' 7"  (1.702 m)   Wt 171 lb 12.8 oz (77.9 kg)   SpO2 98%   BMI 26.91 kg/m  Wt Readings from Last 3 Encounters:  05/20/20 171 lb 12.8 oz (77.9 kg)  05/05/20 164 lb 4 oz (74.5 kg)  04/22/20 171 lb (77.6 kg)    Health Maintenance Due  Topic Date Due  . Hepatitis C Screening  Never done    There are no preventive care reminders to display for this patient.   Lab Results  Component Value Date   TSH 2.250 08/20/2019   Lab Results  Component Value Date   WBC 5.7 01/14/2020   HGB 10.8 (L) 01/14/2020   HCT 34.0 (L) 01/14/2020   MCV 96.3 01/14/2020   PLT 178 01/14/2020   Lab Results  Component Value Date   NA 142 05/06/2020   K 4.8 05/06/2020   CO2 22 05/06/2020   GLUCOSE 105 (H) 05/06/2020   BUN 15 05/06/2020   CREATININE 1.38 (H) 05/06/2020   BILITOT 0.3 05/06/2020   ALKPHOS 77 05/06/2020   AST 23 05/06/2020   ALT 10 05/06/2020   PROT 7.0 05/06/2020   ALBUMIN 4.4 05/06/2020   CALCIUM 9.4 05/06/2020    ANIONGAP 7 01/14/2020   Lab Results  Component Value Date   CHOL 196 08/20/2019   Lab Results  Component Value Date   HDL 62 08/20/2019   Lab Results  Component Value Date   LDLCALC 115 (H) 08/20/2019   Lab Results  Component Value Date   TRIG 105 08/20/2019   Lab Results  Component Value Date   CHOLHDL 3.2 08/20/2019   No results found for: HGBA1C     Assessment & Plan:  1. Dermatitis - triamcinolone acetonide (KENALOG-40) injection 60 mg  2. Pain of lower extremity, unspecified laterality - CBC with Differential/Platelet - Comprehensive metabolic panel - TSH - Magnesium  3. Malaise - CBC with Differential/Platelet - Comprehensive metabolic panel - TSH - Magnesium    Meds ordered this encounter  Medications  . triamcinolone acetonide (KENALOG-40) injection 60 mg    Orders Placed This Encounter  Procedures  . CBC with Differential/Platelet  . Comprehensive metabolic panel  . TSH  . Magnesium     Follow-up: Return if symptoms worsen or fail to improve.  An After Visit Summary was printed and given to the patient.  Yetta Flock [facility] [phone]

## 2020-05-21 LAB — COMPREHENSIVE METABOLIC PANEL
ALT: 13 IU/L (ref 0–32)
AST: 24 IU/L (ref 0–40)
Albumin/Globulin Ratio: 1.7 (ref 1.2–2.2)
Albumin: 4.2 g/dL (ref 3.7–4.7)
Alkaline Phosphatase: 77 IU/L (ref 44–121)
BUN/Creatinine Ratio: 11 — ABNORMAL LOW (ref 12–28)
BUN: 15 mg/dL (ref 8–27)
Bilirubin Total: 0.3 mg/dL (ref 0.0–1.2)
CO2: 24 mmol/L (ref 20–29)
Calcium: 9.8 mg/dL (ref 8.7–10.3)
Chloride: 103 mmol/L (ref 96–106)
Creatinine, Ser: 1.37 mg/dL — ABNORMAL HIGH (ref 0.57–1.00)
Globulin, Total: 2.5 g/dL (ref 1.5–4.5)
Glucose: 107 mg/dL — ABNORMAL HIGH (ref 65–99)
Potassium: 4.1 mmol/L (ref 3.5–5.2)
Sodium: 141 mmol/L (ref 134–144)
Total Protein: 6.7 g/dL (ref 6.0–8.5)
eGFR: 39 mL/min/{1.73_m2} — ABNORMAL LOW (ref >59)

## 2020-05-21 LAB — CBC WITH DIFFERENTIAL/PLATELET
Basophils Absolute: 0 10*3/uL (ref 0.0–0.2)
Basos: 1 %
EOS (ABSOLUTE): 0.6 10*3/uL — ABNORMAL HIGH (ref 0.0–0.4)
Eos: 10 %
Hematocrit: 35 % (ref 34.0–46.6)
Hemoglobin: 11.6 g/dL (ref 11.1–15.9)
Immature Grans (Abs): 0 10*3/uL (ref 0.0–0.1)
Immature Granulocytes: 0 %
Lymphocytes Absolute: 1.4 10*3/uL (ref 0.7–3.1)
Lymphs: 23 %
MCH: 28.4 pg (ref 26.6–33.0)
MCHC: 33.1 g/dL (ref 31.5–35.7)
MCV: 86 fL (ref 79–97)
Monocytes Absolute: 0.5 10*3/uL (ref 0.1–0.9)
Monocytes: 9 %
Neutrophils Absolute: 3.4 10*3/uL (ref 1.4–7.0)
Neutrophils: 57 %
Platelets: 212 10*3/uL (ref 150–450)
RBC: 4.09 x10E6/uL (ref 3.77–5.28)
RDW: 12.8 % (ref 11.7–15.4)
WBC: 6 10*3/uL (ref 3.4–10.8)

## 2020-05-21 LAB — MAGNESIUM: Magnesium: 2.4 mg/dL — ABNORMAL HIGH (ref 1.6–2.3)

## 2020-05-21 LAB — TSH: TSH: 2.3 u[IU]/mL (ref 0.450–4.500)

## 2020-06-02 ENCOUNTER — Other Ambulatory Visit: Payer: Self-pay

## 2020-06-02 ENCOUNTER — Inpatient Hospital Stay: Payer: Medicare HMO | Attending: Oncology

## 2020-06-02 VITALS — BP 149/66 | HR 79 | Temp 97.7°F | Ht 67.0 in | Wt 167.0 lb

## 2020-06-02 DIAGNOSIS — E538 Deficiency of other specified B group vitamins: Secondary | ICD-10-CM

## 2020-06-02 DIAGNOSIS — D631 Anemia in chronic kidney disease: Secondary | ICD-10-CM | POA: Insufficient documentation

## 2020-06-02 DIAGNOSIS — N189 Chronic kidney disease, unspecified: Secondary | ICD-10-CM | POA: Insufficient documentation

## 2020-06-02 MED ORDER — CYANOCOBALAMIN 1000 MCG/ML IJ SOLN
1000.0000 ug | Freq: Once | INTRAMUSCULAR | Status: AC
Start: 2020-06-02 — End: 2020-06-02
  Administered 2020-06-02: 1000 ug via INTRAMUSCULAR

## 2020-06-02 MED ORDER — CYANOCOBALAMIN 1000 MCG/ML IJ SOLN
INTRAMUSCULAR | Status: AC
  Filled 2020-06-02: qty 1

## 2020-06-02 NOTE — Patient Instructions (Signed)

## 2020-06-30 ENCOUNTER — Inpatient Hospital Stay: Payer: Medicare HMO | Attending: Oncology

## 2020-06-30 ENCOUNTER — Other Ambulatory Visit: Payer: Self-pay

## 2020-06-30 VITALS — BP 134/60 | HR 71 | Resp 18 | Ht 67.0 in | Wt 173.8 lb

## 2020-06-30 DIAGNOSIS — E538 Deficiency of other specified B group vitamins: Secondary | ICD-10-CM

## 2020-06-30 MED ORDER — CYANOCOBALAMIN 1000 MCG/ML IJ SOLN
1000.0000 ug | Freq: Once | INTRAMUSCULAR | Status: AC
  Administered 2020-06-30: 1000 ug via INTRAMUSCULAR

## 2020-06-30 MED ORDER — CYANOCOBALAMIN 1000 MCG/ML IJ SOLN
INTRAMUSCULAR | Status: AC
  Filled 2020-06-30: qty 1

## 2020-06-30 NOTE — Patient Instructions (Signed)

## 2020-07-21 ENCOUNTER — Other Ambulatory Visit: Payer: Self-pay | Admitting: Pharmacist

## 2020-07-29 ENCOUNTER — Other Ambulatory Visit: Payer: Self-pay

## 2020-07-29 ENCOUNTER — Inpatient Hospital Stay: Payer: Medicare HMO

## 2020-07-29 VITALS — BP 148/60 | HR 75 | Temp 98.1°F | Resp 18 | Ht 67.0 in | Wt 174.0 lb

## 2020-07-29 DIAGNOSIS — E538 Deficiency of other specified B group vitamins: Secondary | ICD-10-CM

## 2020-07-29 MED ORDER — CYANOCOBALAMIN 1000 MCG/ML IJ SOLN
1000.0000 ug | Freq: Once | INTRAMUSCULAR | Status: AC
  Administered 2020-07-29: 1000 ug via INTRAMUSCULAR

## 2020-07-29 MED ORDER — CYANOCOBALAMIN 1000 MCG/ML IJ SOLN
INTRAMUSCULAR | Status: AC
  Filled 2020-07-29: qty 1

## 2020-07-29 NOTE — Patient Instructions (Signed)

## 2020-07-29 NOTE — Progress Notes (Signed)
Pt d/c stable 1008

## 2020-07-30 ENCOUNTER — Ambulatory Visit (INDEPENDENT_AMBULATORY_CARE_PROVIDER_SITE_OTHER): Payer: Medicare HMO | Admitting: Family Medicine

## 2020-07-30 ENCOUNTER — Encounter: Payer: Self-pay | Admitting: Family Medicine

## 2020-07-30 VITALS — BP 132/70 | HR 72 | Temp 97.3°F | Resp 16 | Ht 67.0 in | Wt 170.0 lb

## 2020-07-30 DIAGNOSIS — K219 Gastro-esophageal reflux disease without esophagitis: Secondary | ICD-10-CM

## 2020-07-30 DIAGNOSIS — I129 Hypertensive chronic kidney disease with stage 1 through stage 4 chronic kidney disease, or unspecified chronic kidney disease: Secondary | ICD-10-CM

## 2020-07-30 DIAGNOSIS — R202 Paresthesia of skin: Secondary | ICD-10-CM

## 2020-07-30 DIAGNOSIS — I700 Atherosclerosis of aorta: Secondary | ICD-10-CM | POA: Diagnosis not present

## 2020-07-30 DIAGNOSIS — R7301 Impaired fasting glucose: Secondary | ICD-10-CM | POA: Diagnosis not present

## 2020-07-30 DIAGNOSIS — E782 Mixed hyperlipidemia: Secondary | ICD-10-CM | POA: Diagnosis not present

## 2020-07-30 DIAGNOSIS — D518 Other vitamin B12 deficiency anemias: Secondary | ICD-10-CM | POA: Diagnosis not present

## 2020-07-30 DIAGNOSIS — N1832 Chronic kidney disease, stage 3b: Secondary | ICD-10-CM | POA: Diagnosis not present

## 2020-07-30 MED ORDER — FAMOTIDINE 40 MG PO TABS: 40.0000 mg | ORAL_TABLET | Freq: Two times a day (BID) | ORAL | 3 refills | Status: DC

## 2020-07-30 NOTE — Patient Instructions (Signed)
Restart famotidine.

## 2020-07-30 NOTE — Progress Notes (Signed)
Subjective:  Patient ID: Rebecca Orr, female    DOB: 1940-04-17  Age: 80 y.o. MRN: [id number]  Chief Complaint  Patient presents with   Gastroesophageal Reflux   HPI GERD: no longer on pepcid, but is having reflux. Thinks she needs to restart it.   B12 deficiency: on b12 shots monthly.   Still feels legs are weak. No muscle cramps. Has Hyland's drops if needed. Has cyclobenzaprine, but has not had to take it. Head feels funny with flexeril, but it is brief.  Aortic atherosclerosis: mild disease found on ct scan of abd/pelvis in 04/2020. Pt refuses statins due to her leg issues.   Current Outpatient Medications on File Prior to Visit  Medication Sig Dispense Refill   Cyanocobalamin (VITAMIN B-12 IJ) Inject as directed every 30 (thirty) days.     Current Facility-Administered Medications on File Prior to Visit  Medication Dose Route Frequency Provider Last Rate Last Admin   triamcinolone acetonide (KENALOG-40) injection 60 mg  60 mg Intramuscular Once Marge Duncans, PA-C       Past Medical History:  Diagnosis Date   Acute pain of left knee 08/20/2019   Acute pain of right knee 08/20/2019   Anemia 07/08/2015   B12 deficiency 12/13/2019   Cancer ([place])    Colon cancer 2017    Cholecystitis 08/08/2015   Chronic renal impairment, stage 3b ([place]) 08/20/2019   Essential hypertension 03/16/2018   GERD (gastroesophageal reflux disease)    Heartburn 07/08/2015   Hematochezia 07/08/2015   Iron deficiency anemia    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   Palpitations 03/16/2018   Paresthesias 08/20/2019   Postoperative examination 09/01/2015   Renal stone    Transient ischemic attack    Past Surgical History:  Procedure Laterality Date   CATARACT EXTRACTION     CHOLECYSTECTOMY     HEMICOLECTOMY  2017   SPHINCTEROTOMY  2011    Family History  Problem Relation Age of Onset   Dementia Mother    Lung cancer Father    Heart disease Father    Renal cancer Sister     Prostate cancer Brother    Breast cancer Paternal Grandmother    Social History   Socioeconomic History   Marital status: Widowed    Spouse name: Not on file   Number of children: Not on file   Years of education: Not on file   Highest education level: Not on file  Occupational History   Not on file  Tobacco Use   Smoking status: Never   Smokeless tobacco: Never  Vaping Use   Vaping Use: Never used  Substance and Sexual Activity   Alcohol use: Never   Drug use: Never   Sexual activity: Not on file  Other Topics Concern   Not on file  Social History Narrative   Not on file   Social Determinants of Health   Financial Resource Strain: Not on file  Food Insecurity: Not on file  Transportation Needs: Not on file  Physical Activity: Not on file  Stress: Not on file  Social Connections: Not on file    Review of Systems  Constitutional:  Negative for chills, fatigue and fever.  HENT:  Negative for congestion, rhinorrhea and sore throat.   Respiratory:  Negative for cough and shortness of breath.   Cardiovascular:  Negative for chest pain.  Gastrointestinal:  Negative for abdominal pain, constipation, diarrhea, nausea and vomiting.  Genitourinary:  Negative for dysuria and urgency.  Musculoskeletal:  Positive for myalgias. Negative for back pain.  Neurological:  Negative for dizziness, weakness, light-headedness and headaches.  Psychiatric/Behavioral:  Negative for dysphoric mood. The patient is not nervous/anxious.     Objective:  BP 132/70   Pulse 72   Temp (!) 97.3 F (36.3 C)   Resp 16   Ht 5\' 7"  (1.702 m)   Wt 170 lb (77.1 kg)   BMI 26.63 kg/m   BP/Weight 07/30/2020 7/82/4235 05/04/1441  Systolic BP 154 008 676  Diastolic BP 70 60 60  Wt. (Lbs) 170 174.04 173.75  BMI 26.63 27.26 27.21    Physical Exam Vitals reviewed.  Constitutional:      Appearance: Normal appearance. She is normal weight.  HENT:     Right Ear: Tympanic membrane normal.     Left  Ear: Tympanic membrane normal.     Nose: Nose normal.     Mouth/Throat:     Pharynx: No oropharyngeal exudate or posterior oropharyngeal erythema.  Neck:     Vascular: No carotid bruit.  Cardiovascular:     Rate and Rhythm: Normal rate and regular rhythm.     Pulses: Normal pulses.     Heart sounds: Normal heart sounds.  Pulmonary:     Effort: Pulmonary effort is normal. No respiratory distress.     Breath sounds: Normal breath sounds.  Abdominal:     General: Abdomen is flat. Bowel sounds are normal.     Palpations: Abdomen is soft.     Tenderness: There is no abdominal tenderness.  Neurological:     Mental Status: She is alert and oriented to person, place, and time.  Psychiatric:        Mood and Affect: Mood normal.        Behavior: Behavior normal.    Diabetic Foot Exam - Simple   No data filed      Lab Results  Component Value Date   WBC 4.8 07/30/2020   HGB 12.0 07/30/2020   HCT 36.2 07/30/2020   PLT 194 07/30/2020   GLUCOSE 107 (H) 07/30/2020   CHOL 212 (H) 07/30/2020   TRIG 93 07/30/2020   HDL 76 07/30/2020   LDLCALC 120 (H) 07/30/2020   ALT 13 07/30/2020   AST 22 07/30/2020   NA 140 07/30/2020   K 4.9 07/30/2020   CL 105 07/30/2020   CREATININE 1.46 (H) 07/30/2020   BUN 21 07/30/2020   CO2 21 07/30/2020   TSH 2.810 07/30/2020   HGBA1C 5.6 07/30/2020      Assessment & Plan:  1. Stage 3b chronic kidney disease (HCC) - CBC with Differential/Platelet - Comprehensive metabolic panel  2. Other vitamin B12 deficiency anemia - TSH - B12 and Folate Panel - Methylmalonic acid, serum  3. Paresthesia of bilateral legs Unclear etiology. - TSH - B12 and Folate Panel  4. Gastroesophageal reflux disease without esophagitis Restart pepcid - famotidine (PEPCID) 40 MG tablet; Take 1 tablet (40 mg total) by mouth 2 (two) times daily.  Dispense: 180 tablet; Refill: 3  5. Atherosclerosis of aorta (HCC) Recommend aspirin and statin. Pt refuses at this time  due to myalgias.  - Lipid panel - Cardiovascular Risk Assessment  6. Mixed hyperlipidemia Refused statins.  Recommend continue to work on eating healthy diet and exercise. - Lipid panel  Meds ordered this encounter  Medications   famotidine (PEPCID) 40 MG tablet    Sig: Take 1 tablet (40 mg total) by mouth 2 (two) times daily.    Dispense:  180  tablet    Refill:  3    Orders Placed This Encounter  Procedures   CBC with Differential/Platelet   Comprehensive metabolic panel   Lipid panel   TSH   B12 and Folate Panel   Methylmalonic acid, serum   Cardiovascular Risk Assessment   Hgb A1c w/o eAG   Specimen status report   Follow-up: Return in about 5 months (around 12/30/2020) for fasting.  An After Visit Summary was printed and given to the patient.  Rochel Brome, MD [facility] [phone]

## 2020-08-02 LAB — B12 AND FOLATE PANEL
Folate: 14.9 ng/mL (ref >3.0)
Vitamin B-12: >2000 pg/mL — ABNORMAL HIGH (ref 232–1245)

## 2020-08-02 LAB — LIPID PANEL
Chol/HDL Ratio: 2.8 ratio (ref 0.0–4.4)
Cholesterol, Total: 212 mg/dL — ABNORMAL HIGH (ref 100–199)
HDL: 76 mg/dL (ref >39)
LDL Chol Calc (NIH): 120 mg/dL — ABNORMAL HIGH (ref 0–99)
Triglycerides: 93 mg/dL (ref 0–149)
VLDL Cholesterol Cal: 16 mg/dL (ref 5–40)

## 2020-08-02 LAB — CBC WITH DIFFERENTIAL/PLATELET
Basophils Absolute: 0 10*3/uL (ref 0.0–0.2)
Basos: 1 %
EOS (ABSOLUTE): 0.4 10*3/uL (ref 0.0–0.4)
Eos: 7 %
Hematocrit: 36.2 % (ref 34.0–46.6)
Hemoglobin: 12 g/dL (ref 11.1–15.9)
Immature Grans (Abs): 0 10*3/uL (ref 0.0–0.1)
Immature Granulocytes: 0 %
Lymphocytes Absolute: 1.3 10*3/uL (ref 0.7–3.1)
Lymphs: 26 %
MCH: 29.3 pg (ref 26.6–33.0)
MCHC: 33.1 g/dL (ref 31.5–35.7)
MCV: 88 fL (ref 79–97)
Monocytes Absolute: 0.4 10*3/uL (ref 0.1–0.9)
Monocytes: 9 %
Neutrophils Absolute: 2.8 10*3/uL (ref 1.4–7.0)
Neutrophils: 57 %
Platelets: 194 10*3/uL (ref 150–450)
RBC: 4.1 x10E6/uL (ref 3.77–5.28)
RDW: 13.3 % (ref 11.7–15.4)
WBC: 4.8 10*3/uL (ref 3.4–10.8)

## 2020-08-02 LAB — COMPREHENSIVE METABOLIC PANEL
ALT: 13 IU/L (ref 0–32)
AST: 22 IU/L (ref 0–40)
Albumin/Globulin Ratio: 1.8 (ref 1.2–2.2)
Albumin: 4.3 g/dL (ref 3.7–4.7)
Alkaline Phosphatase: 72 IU/L (ref 44–121)
BUN/Creatinine Ratio: 14 (ref 12–28)
BUN: 21 mg/dL (ref 8–27)
Bilirubin Total: 0.5 mg/dL (ref 0.0–1.2)
CO2: 21 mmol/L (ref 20–29)
Calcium: 9.6 mg/dL (ref 8.7–10.3)
Chloride: 105 mmol/L (ref 96–106)
Creatinine, Ser: 1.46 mg/dL — ABNORMAL HIGH (ref 0.57–1.00)
Globulin, Total: 2.4 g/dL (ref 1.5–4.5)
Glucose: 107 mg/dL — ABNORMAL HIGH (ref 65–99)
Potassium: 4.9 mmol/L (ref 3.5–5.2)
Sodium: 140 mmol/L (ref 134–144)
Total Protein: 6.7 g/dL (ref 6.0–8.5)
eGFR: 36 mL/min/{1.73_m2} — ABNORMAL LOW (ref >59)

## 2020-08-02 LAB — TSH: TSH: 2.81 u[IU]/mL (ref 0.450–4.500)

## 2020-08-02 LAB — CARDIOVASCULAR RISK ASSESSMENT

## 2020-08-02 LAB — METHYLMALONIC ACID, SERUM: Methylmalonic Acid: 245 nmol/L (ref 0–378)

## 2020-08-05 LAB — HGB A1C W/O EAG: Hgb A1c MFr Bld: 5.6 % (ref 4.8–5.6)

## 2020-08-05 LAB — SPECIMEN STATUS REPORT

## 2020-08-13 ENCOUNTER — Ambulatory Visit (INDEPENDENT_AMBULATORY_CARE_PROVIDER_SITE_OTHER): Payer: Medicare HMO | Admitting: Cardiology

## 2020-08-13 ENCOUNTER — Other Ambulatory Visit: Payer: Self-pay

## 2020-08-13 ENCOUNTER — Encounter: Payer: Self-pay | Admitting: Cardiology

## 2020-08-13 VITALS — BP 146/70 | HR 84 | Ht 67.0 in | Wt 172.8 lb

## 2020-08-13 DIAGNOSIS — I739 Peripheral vascular disease, unspecified: Secondary | ICD-10-CM | POA: Diagnosis not present

## 2020-08-13 DIAGNOSIS — N1832 Chronic kidney disease, stage 3b: Secondary | ICD-10-CM | POA: Diagnosis not present

## 2020-08-13 DIAGNOSIS — R002 Palpitations: Secondary | ICD-10-CM | POA: Diagnosis not present

## 2020-08-13 HISTORY — DX: Peripheral vascular disease, unspecified: I73.9

## 2020-08-13 NOTE — Patient Instructions (Signed)
Medication Instructions:  No medication changes. *If you need a refill on your cardiac medications before your next appointment, please call your pharmacy*   Lab Work: None ordered If you have labs (blood work) drawn today and your tests are completely normal, you will receive your results only by: [place] (if you have MyChart) OR A paper copy in the mail If you have any lab test that is abnormal or we need to change your treatment, we will call you to review the results.   Testing/Procedures: Your physician has requested that you have an ankle brachial index (ABI). During this test an ultrasound and blood pressure cuff are used to evaluate the arteries that supply the arms and legs with blood. Allow thirty minutes for this exam. There are no restrictions or special instructions.    Follow-Up: At [facility], you and your health needs are our priority.  As part of our continuing mission to provide you with exceptional heart care, we have created designated Provider Care Teams.  These Care Teams include your primary Cardiologist (physician) and Advanced Practice Providers (APPs -  Physician Assistants and Nurse Practitioners) who all work together to provide you with the care you need, when you need it.  We recommend signing up for the patient portal called "MyChart".  Sign up information is provided on this After Visit Summary.  MyChart is used to connect with patients for Virtual Visits (Telemedicine).  Patients are able to view lab/test results, encounter notes, upcoming appointments, etc.  Non-urgent messages can be sent to your provider as well.   To learn more about what you can do with MyChart, go to [email or web address].    Your next appointment:   1 month(s)  The format for your next appointment:   In Person  Provider:   Jyl Heinz, MD   Other Instructions Ankle-Brachial Index Test Why am I having this test? The ankle-brachial index (ABI) test is used to  diagnose peripheral vascular disease (PVD). PVD is also known as peripheral arterial disease (PAD). PVD is the blocking or hardening of the arteries anywhere within the circulatory system beyond the heart. It is a form of cardiovascular disease. PVD is caused by: Cholesterol deposits in your blood vessels (atherosclerosis). This is the most common cause of this condition. Inflammation in the blood vessels. Blood clots in the vessels. Cholesterol deposits cause arteries to narrow. Decreased delivery of oxygen to your tissues causes muscle pain, cramping, and fatigue that occurs duringexercise and is relieved by rest. This is called intermittent claudication. PVD means that there may also be buildup of cholesterol: In your heart. This raises the risk of heart attacks. In your brain. This raises the risk of strokes. What is being tested? The ankle-brachial index test measures the blood flow in your arms and legs. The blood flow will show if blood vessels in your legs have been narrowed bycholesterol deposits. How do I prepare for this test? Wear loose, comfortable clothing with shoes that are easy to remove. Do not use any products that contain nicotine or tobacco for at least 30 minutes before the test. These products include cigarettes, chewing tobacco, and vaping devices, such as e-cigarettes. Avoid caffeine, alcohol, and heavy activity an hour before the test. What happens during the test?  You will lie down in a resting position with your legs and arms at heart level. Your health care provider will use a blood pressure machine and a small ultrasound device (Doppler) to measure the systolic pressures on your  upper arms and ankles. Systolic pressure is the pressure inside your arteries when your heart pumps. Systolic pressures will be taken several times, and at several points, on both the ankle and the arm. Your health care provider may also measure your toe. Your health care provider will divide  the highest systolic pressure of the ankle by the highest systolic pressure of the arm. The result is the ankle-brachial pressure ratio, or ABI. Sometimes this test will be repeated after you have exercised on a treadmill for 5 minutes. You may have leg pain during the exercise portion of the test if you suffer from PVD. If the index number drops after exercise, this may show that PVD is present. How are the results reported? Your test results will be reported as a value that shows the ratio of your ankle pressure to your arm pressure (ABI ratio). Your health care provider will compare your results to normal ranges that were established after testing a large group of people (reference ranges). These ranges may vary among labs and hospitals. For this test, an abnormal reading is typically considered, which is an ABIratio of 0.9 or less. What do the results mean? An ABI ratio that is below the reference range is considered abnormal and mayindicate PVD in the legs. Talk with your health care provider about what your results mean. Questions to ask your health care provider Ask your health care provider, or the department that is doing the test: When will my results be ready? How will I get my results? What are my treatment options? What other tests do I need? What are my next steps? Summary The ankle-brachial index (ABI) test is used to diagnose peripheral vascular disease (PVD). PVD is also known as peripheral arterial disease (PAD). The ABI test measures the blood flow in your arms and legs. The highest systolic pressure of the ankle is divided by the highest systolic pressure of the arm. The result is the ABI ratio. An ABI ratio of 0.9 or less is considered abnormal and may indicate PVD in the legs. This information is not intended to replace advice given to you by your health care provider. Make sure you discuss any questions you have with your healthcare provider. Document Revised: 08/20/2019  Document Reviewed: 08/20/2019 Elsevier Patient Education  [place].

## 2020-08-13 NOTE — Progress Notes (Signed)
Cardiology Office Note:    Date:  08/13/2020   ID:  Rebecca Orr, DOB 09/16/1940, MRN [id number]  PCP:  Rochel Brome, MD  Cardiologist:  Jenean Lindau, MD   Referring MD: Rochel Brome, MD    ASSESSMENT:    1. 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 Chronic renal impairment, stage 3b (HCC)   3. Intermittent claudication (HCC)   4. Palpitations    PLAN:    In order of problems listed above:  Primary prevention stressed with the patient.  Importance of compliance with diet medication stressed and she vocalized understanding. Intermittent claudication: Her symptoms are concerning.  Physical examination also raises the necessity for evaluation for peripheral vascular disease.  She has atherosclerosis of the aorta upon review of her previous CT scan of the abdomen.  I discussed this with her at length.  She is agreeable and we will start with ABI testing to start with. Mixed dyslipidemia: Lipid therapy was mentioned.  She is not keen on statins.  She wants to first get evaluated for her circulation problems and leg pain problems.  I also told her to discuss these issues with her primary care.  She had any issues with statins in the past overall she is just concerned about their side effects. Palpitations: These have resolved and she is happy about it. Patient will be seen in follow-up appointment in 4 weeks or earlier if the patient has any concerns    Medication Adjustments/Labs and Tests Ordered: Current medicines are reviewed at length with the patient today.  Concerns regarding medicines are outlined above.  Orders Placed This Encounter  Procedures   VAS [place] ABI WITH/WO TBI   No orders of the defined types were placed in this encounter.    No chief complaint on file.    History of Present Illness:    Rebecca Orr is a 80 y.o. female.  Patient has history of palpitations and mixed dyslipidemia.  She has history of renal  insufficiency.  She mentions to me that her palpitations have resolved significantly and she is happy about it.  Unfortunately She cannot do much with ambulation because of pain in lower extremities.  Her history suggest intermittent claudication.  She also has renal insufficiency.  At the time of my evaluation, the patient is alert awake oriented and in no distress.  Past Medical History:  Diagnosis Date   Acute pain of left knee 08/20/2019   Acute pain of right knee 08/20/2019   Anemia 07/08/2015   B12 deficiency 12/13/2019   Cancer ([place])    Colon cancer 2017    Cholecystitis 08/08/2015   Chronic renal impairment, stage 3b ([place]) 08/20/2019   Dermatitis 05/20/2020   Essential hypertension 03/16/2018   GERD (gastroesophageal reflux disease)    Heartburn 07/08/2015   Hematochezia 07/08/2015   Iron deficiency anemia    Pain of lower extremity 05/20/2020   Palpitations 03/16/2018   Paresthesias 08/20/2019   Postoperative examination 09/01/2015   Renal stone    Transient ischemic attack     Past Surgical History:  Procedure Laterality Date   CATARACT EXTRACTION     CHOLECYSTECTOMY     HEMICOLECTOMY  2017   SPHINCTEROTOMY  2011    Current Medications: Current Meds  Medication Sig   famotidine (PEPCID) 40 MG tablet Take 40 mg by mouth daily.     Allergies:   Nsaids, Asa [aspirin], Atenolol, Naproxen, Propranolol, and Tizanidine   Social History  Socioeconomic History   Marital status: Widowed    Spouse name: Not on file   Number of children: Not on file   Years of education: Not on file   Highest education level: Not on file  Occupational History   Not on file  Tobacco Use   Smoking status: Never   Smokeless tobacco: Never  Vaping Use   Vaping Use: Never used  Substance and Sexual Activity   Alcohol use: Never   Drug use: Never   Sexual activity: Not on file  Other Topics Concern   Not on file  Social History Narrative   Not on file   Social Determinants of  Health   Financial Resource Strain: Not on file  Food Insecurity: Not on file  Transportation Needs: Not on file  Physical Activity: Not on file  Stress: Not on file  Social Connections: Not on file     Family History: The patient's family history includes Breast cancer in her paternal grandmother; Dementia in her mother; Heart disease in her father; Lung cancer in her father; Prostate cancer in her brother; Renal cancer in her sister.  ROS:   Please see the history of present illness.    All other systems reviewed and are negative.  EKGs/Labs/Other Studies Reviewed:    The following studies were reviewed today: I discussed my findings with the patient in extensive length   Recent Labs: 05/20/2020: Magnesium 2.4 07/30/2020: ALT 13; BUN 21; Creatinine, Ser 1.46; Hemoglobin 12.0; Platelets 194; Potassium 4.9; Sodium 140; TSH 2.810  Recent Lipid Panel    Component Value Date/Time   CHOL 212 (H) 07/30/2020 0932   TRIG 93 07/30/2020 0932   HDL 76 07/30/2020 0932   CHOLHDL 2.8 07/30/2020 0932   LDLCALC 120 (H) 07/30/2020 0932    Physical Exam:    VS:  BP (!) 146/70   Pulse 84   Ht 5\' 7"  (1.702 m)   Wt 172 lb 12.8 oz (78.4 kg)   SpO2 98%   BMI 27.06 kg/m     Wt Readings from Last 3 Encounters:  08/13/20 172 lb 12.8 oz (78.4 kg)  07/30/20 170 lb (77.1 kg)  07/29/20 174 lb 0.6 oz (78.9 kg)     GEN: Patient is in no acute distress HEENT: Normal NECK: No JVD; No carotid bruits LYMPHATICS: No lymphadenopathy CARDIAC: Hear sounds regular, 2/6 systolic murmur at the apex.  Peripheral pulses are barely felt especially the posterior tibial are barely palpable. RESPIRATORY:  Clear to auscultation without rales, wheezing or rhonchi  ABDOMEN: Soft, non-tender, non-distended MUSCULOSKELETAL:  No edema; No deformity  SKIN: Warm and dry NEUROLOGIC:  Alert and oriented x 3 PSYCHIATRIC:  Normal affect   Signed, Jenean Lindau, MD  08/13/2020 11:47 AM    [place]

## 2020-08-15 ENCOUNTER — Telehealth: Payer: Self-pay

## 2020-08-15 NOTE — Telephone Encounter (Addendum)
I notified pt of below. She verbalized understanding. She thinks the B12 level was up because she had taken an injection just 1 day before appt.  ----- Message from Marvia Pickles, PA-C sent at 08/15/2020  2:05 PM EDT ----- Regarding: RE: B12 injections vs oral Contact: [phone] Since she is on B12 injections I expect her B12 to be high and that is ok. Dr. Bobby Rumpf confirms and said she can continue B12 monthly. Thanks ----- Message ----- From: Dairl Ponder, RN Sent: 08/14/2020   2:33 PM EDT To: Marvia Pickles, PA-C Subject: B12 injections vs oral                         Pt saw her PCP recently and had labs drawn (the results are in epic). The PCP recommended she starting po B12 rather than injections monthly. Pt's B12 was >2000.  Folate 14.9;  MMA done as well. Pt prefers to stay on the monthly injection. Please advise.

## 2020-08-18 ENCOUNTER — Other Ambulatory Visit: Payer: Self-pay

## 2020-08-19 MED ORDER — FAMOTIDINE 40 MG PO TABS: 40.0000 mg | ORAL_TABLET | Freq: Every day | ORAL | 0 refills | Status: DC

## 2020-08-22 ENCOUNTER — Other Ambulatory Visit: Payer: Self-pay | Admitting: Pharmacist

## 2020-08-25 ENCOUNTER — Other Ambulatory Visit: Payer: Self-pay

## 2020-08-25 ENCOUNTER — Inpatient Hospital Stay: Payer: Medicare HMO | Attending: Oncology

## 2020-08-25 VITALS — BP 145/62 | HR 65 | Temp 98.0°F | Resp 18 | Ht 67.0 in | Wt 170.8 lb

## 2020-08-25 DIAGNOSIS — E538 Deficiency of other specified B group vitamins: Secondary | ICD-10-CM | POA: Diagnosis not present

## 2020-08-25 MED ORDER — CYANOCOBALAMIN 1000 MCG/ML IJ SOLN
1000.0000 ug | Freq: Once | INTRAMUSCULAR | Status: AC
  Administered 2020-08-25: 1000 ug via INTRAMUSCULAR

## 2020-08-25 MED ORDER — CYANOCOBALAMIN 1000 MCG/ML IJ SOLN
INTRAMUSCULAR | Status: AC
  Filled 2020-08-25: qty 1

## 2020-08-25 NOTE — Patient Instructions (Signed)
Vitamin B12 Injection What is this medication? Vitamin B12 (VAHY tuh min B12) prevents and treats low vitamin B12 levels in your body. It is used in people who do not get enough vitamin B12 from their diet or when their digestive tract does not absorb enough. Vitamin B12 plays an important role in maintaining the health of your nervous system and red bloodcells. This medicine may be used for other purposes; ask your health care provider orpharmacist if you have questions. COMMON BRAND NAME(S): B-12 Compliance Kit, B-12 Injection Kit, Cyomin, LA-12,Nutri-Twelve, Physicians EZ Use B-12, Primabalt What should I tell my care team before I take this medication? They need to know if you have any of these conditions: Kidney disease Leber's disease Megaloblastic anemia An unusual or allergic reaction to cyanocobalamin, cobalt, other medications, foods, dyes, or preservatives Pregnant or trying to get pregnant Breast-feeding How should I use this medication? This medication is injected into a muscle or deeply under the skin. It is usually given in a clinic or care team's office. However, your care team mayteach you how to inject yourself. Follow all instructions. Talk to your care team about the use of this medication in children. Specialcare may be needed. Overdosage: If you think you have taken too much of this medicine contact apoison control center or emergency room at once. NOTE: This medicine is only for you. Do not share this medicine with others. What if I miss a dose? If you are given your dose at a clinic or care team's office, call to reschedule your appointment. If you give your own injections, and you miss a dose, take it as soon as you can. If it is almost time for your next dose, takeonly that dose. Do not take double or extra doses. What may interact with this medication? Colchicine Heavy alcohol intake This list may not describe all possible interactions. Give your health care provider  a list of all the medicines, herbs, non-prescription drugs, or dietary supplements you use. Also tell them if you smoke, drink alcohol, or use illegaldrugs. Some items may interact with your medicine. What should I watch for while using this medication? Visit your care team regularly. You may need blood work done while you aretaking this medication. You may need to follow a special diet. Talk to your care team. Limit youralcohol intake and avoid smoking to get the best benefit. What side effects may I notice from receiving this medication? Side effects that you should report to your care team as soon as possible: Allergic reactions-skin rash, itching, hives, swelling of the face, lips, tongue, or throat Swelling of the ankles, hands, or feet Trouble breathing Side effects that usually do not require medical attention (report to your careteam if they continue or are bothersome): Diarrhea This list may not describe all possible side effects. Call your doctor for medical advice about side effects. You may report side effects to FDA at1-800-FDA-1088. Where should I keep my medication? Keep out of the reach of children. Store at room temperature between 15 and 30 degrees C (59 and 85 degrees F).Protect from light. Throw away any unused medication after the expiration date. NOTE: This sheet is a summary. It may not cover all possible information. If you have questions about this medicine, talk to your doctor, pharmacist, orhealth care provider.  2022 Elsevier/Gold Standard (2020-04-07 11:47:06)  

## 2020-09-02 ENCOUNTER — Other Ambulatory Visit: Payer: Self-pay

## 2020-09-02 ENCOUNTER — Ambulatory Visit (INDEPENDENT_AMBULATORY_CARE_PROVIDER_SITE_OTHER): Payer: Medicare HMO

## 2020-09-02 DIAGNOSIS — I739 Peripheral vascular disease, unspecified: Secondary | ICD-10-CM

## 2020-09-02 NOTE — Progress Notes (Signed)
ABI exam has been performed.  Jimmy Shunsuke Granzow RDCS, RVT

## 2020-09-15 ENCOUNTER — Other Ambulatory Visit: Payer: Self-pay

## 2020-09-15 ENCOUNTER — Other Ambulatory Visit: Payer: Self-pay | Admitting: Pharmacist

## 2020-09-15 ENCOUNTER — Encounter: Payer: Self-pay | Admitting: Cardiology

## 2020-09-15 ENCOUNTER — Ambulatory Visit: Payer: Medicare HMO | Admitting: Cardiology

## 2020-09-15 VITALS — BP 144/68 | HR 82 | Ht 67.0 in | Wt 172.2 lb

## 2020-09-15 DIAGNOSIS — M79604 Pain in right leg: Secondary | ICD-10-CM

## 2020-09-15 DIAGNOSIS — M79605 Pain in left leg: Secondary | ICD-10-CM | POA: Diagnosis not present

## 2020-09-15 DIAGNOSIS — N1832 Chronic kidney disease, stage 3b: Secondary | ICD-10-CM | POA: Diagnosis not present

## 2020-09-15 DIAGNOSIS — I10 Essential (primary) hypertension: Secondary | ICD-10-CM

## 2020-09-15 DIAGNOSIS — I700 Atherosclerosis of aorta: Secondary | ICD-10-CM

## 2020-09-15 DIAGNOSIS — R002 Palpitations: Secondary | ICD-10-CM | POA: Diagnosis not present

## 2020-09-15 HISTORY — DX: Atherosclerosis of aorta: I70.0

## 2020-09-15 NOTE — Patient Instructions (Signed)
Medication Instructions:  No medication changes. *If you need a refill on your cardiac medications before your next appointment, please call your pharmacy*   Lab Work: None ordered If you have labs (blood work) drawn today and your tests are completely normal, you will receive your results only by: [place] (if you have MyChart) OR A paper copy in the mail If you have any lab test that is abnormal or we need to change your treatment, we will call you to review the results.   Testing/Procedures: Your physician has requested that you have an ankle brachial index (ABI). During this test an ultrasound and blood pressure cuff are used to evaluate the arteries that supply the arms and legs with blood. Allow thirty minutes for this exam. There are no restrictions or special instructions.   This will be done at [facility].   Follow-Up: At [facility], you and your health needs are our priority.  As part of our continuing mission to provide you with exceptional heart care, we have created designated Provider Care Teams.  These Care Teams include your primary Cardiologist (physician) and Advanced Practice Providers (APPs -  Physician Assistants and Nurse Practitioners) who all work together to provide you with the care you need, when you need it.  We recommend signing up for the patient portal called "MyChart".  Sign up information is provided on this After Visit Summary.  MyChart is used to connect with patients for Virtual Visits (Telemedicine).  Patients are able to view lab/test results, encounter notes, upcoming appointments, etc.  Non-urgent messages can be sent to your provider as well.   To learn more about what you can do with MyChart, go to [email or web address].    Your next appointment:   6 month(s)  The format for your next appointment:   In Person  Provider:   Jyl Heinz, MD   Other Instructions NA

## 2020-09-15 NOTE — Progress Notes (Signed)
Cardiology Office Note:    Date:  09/15/2020   ID:  Rebecca Orr, DOB December 28, 1940, MRN [id number]  PCP:  Rochel Brome, MD  Cardiologist:  Jenean Lindau, MD   Referring MD: Rochel Brome, MD    ASSESSMENT:    1. Essential hypertension   2. Palpitations   3. Chronic renal impairment, stage 3b (HCC)   4. Abdominal aortic atherosclerosis (HCC)    PLAN:    In order of problems listed above:  Primary prevention stressed with the patient.  Importance of compliance with diet medication stressed and she vocalized understanding. Abdominal aortic atherosclerosis: I discussed statin therapy with the patient is not keen on it.  This is because of leg cramping.  I respect her wishes.  Diet was emphasized.  Significant and she vocalized understanding. Intermittent claudication: I would like to get her set up for segmental pressures of lower extremity addressed and exercise and she is agreeable.  Further recommendations will be made based on the findings of this test. Patient will be seen in follow-up appointment in 6 months or earlier if the patient has any concerns    Medication Adjustments/Labs and Tests Ordered: Current medicines are reviewed at length with the patient today.  Concerns regarding medicines are outlined above.  No orders of the defined types were placed in this encounter.  No orders of the defined types were placed in this encounter.    No chief complaint on file.    History of Present Illness:    Rebecca Orr is a 80 y.o. female.  Patient has history of palpitations which have resolved.  She has renal insufficiency.  She gives history suggesting of intermittent claudication.  Her ABI was unremarkable.  She continues to have the symptoms.  She has mildly elevated lipids she has atherosclerosis of the abdominal artery and not keen on statins because of cramping in the legs.  At the time of my evaluation, the patient is alert awake oriented  and in no distress.  Past Medical History:  Diagnosis Date   Acute pain of left knee 08/20/2019   Acute pain of right knee 08/20/2019   Anemia 07/08/2015   B12 deficiency 12/13/2019   Cancer ([place])    Colon cancer 2017    Cholecystitis 08/08/2015   Chronic renal impairment, stage 3b ([place]) 08/20/2019   Dermatitis 05/20/2020   Essential hypertension 03/16/2018   GERD (gastroesophageal reflux disease)    Heartburn 07/08/2015   Hematochezia 07/08/2015   Intermittent claudication ([place]) 08/13/2020   Iron deficiency anemia    Pain of lower extremity 05/20/2020   Palpitations 03/16/2018   Paresthesias 08/20/2019   Postoperative examination 09/01/2015   Renal stone    Transient ischemic attack     Past Surgical History:  Procedure Laterality Date   CATARACT EXTRACTION     CHOLECYSTECTOMY     HEMICOLECTOMY  2017   SPHINCTEROTOMY  2011    Current Medications: No outpatient medications have been marked as taking for the 09/15/20 encounter (Office Visit) with Zamaya Rapaport, Reita Cliche, MD.     Allergies:   Nsaids, Asa [aspirin], Atenolol, Naproxen, Propranolol, and Tizanidine   Social History   Socioeconomic History   Marital status: Widowed    Spouse name: Not on file   Number of children: Not on file   Years of education: Not on file   Highest education level: Not on file  Occupational History   Not on file  Tobacco Use   Smoking status: Never  Smokeless tobacco: Never  Vaping Use   Vaping Use: Never used  Substance and Sexual Activity   Alcohol use: Never   Drug use: Never   Sexual activity: Not on file  Other Topics Concern   Not on file  Social History Narrative   Not on file   Social Determinants of Health   Financial Resource Strain: Not on file  Food Insecurity: Not on file  Transportation Needs: Not on file  Physical Activity: Not on file  Stress: Not on file  Social Connections: Not on file     Family History: The patient's family history includes Breast  cancer in her paternal grandmother; Dementia in her mother; Heart disease in her father; Lung cancer in her father; Prostate cancer in her brother; Renal cancer in her sister.  ROS:   Please see the history of present illness.    All other systems reviewed and are negative.  EKGs/Labs/Other Studies Reviewed:    The following studies were reviewed today: I discussed findings with the patient at extensive length ABI was within normal limits.   Recent Labs: 05/20/2020: Magnesium 2.4 07/30/2020: ALT 13; BUN 21; Creatinine, Ser 1.46; Hemoglobin 12.0; Platelets 194; Potassium 4.9; Sodium 140; TSH 2.810  Recent Lipid Panel    Component Value Date/Time   CHOL 212 (H) 07/30/2020 0932   TRIG 93 07/30/2020 0932   HDL 76 07/30/2020 0932   CHOLHDL 2.8 07/30/2020 0932   LDLCALC 120 (H) 07/30/2020 0932    Physical Exam:    VS:  BP (!) 144/68   Pulse 82   Ht 5\' 7"  (1.702 m)   Wt 172 lb 3.2 oz (78.1 kg)   SpO2 96%   BMI 26.97 kg/m     Wt Readings from Last 3 Encounters:  09/15/20 172 lb 3.2 oz (78.1 kg)  08/25/20 170 lb 12 oz (77.5 kg)  08/13/20 172 lb 12.8 oz (78.4 kg)     GEN: Patient is in no acute distress HEENT: Normal NECK: No JVD; No carotid bruits LYMPHATICS: No lymphadenopathy CARDIAC: Hear sounds regular, 2/6 systolic murmur at the apex. RESPIRATORY:  Clear to auscultation without rales, wheezing or rhonchi  ABDOMEN: Soft, non-tender, non-distended MUSCULOSKELETAL:  No edema; No deformity  SKIN: Warm and dry NEUROLOGIC:  Alert and oriented x 3 PSYCHIATRIC:  Normal affect   Signed, Jenean Lindau, MD  09/15/2020 10:13 AM    [place]

## 2020-09-16 ENCOUNTER — Telehealth: Payer: Self-pay

## 2020-09-16 NOTE — Telephone Encounter (Signed)
Left VM for pt to callback. Appointment is at [facility] 09/18/20 arrive at 1:30 in the outpatient center for 2:00 appointment for segmental pressures.

## 2020-09-17 ENCOUNTER — Encounter: Payer: Self-pay | Admitting: Cardiology

## 2020-09-18 DIAGNOSIS — M79604 Pain in right leg: Secondary | ICD-10-CM | POA: Diagnosis not present

## 2020-09-18 DIAGNOSIS — M79605 Pain in left leg: Secondary | ICD-10-CM | POA: Diagnosis not present

## 2020-09-22 ENCOUNTER — Inpatient Hospital Stay: Payer: Medicare HMO | Attending: Oncology

## 2020-09-22 ENCOUNTER — Other Ambulatory Visit: Payer: Self-pay

## 2020-09-22 VITALS — BP 135/54 | HR 85 | Temp 97.8°F | Resp 18 | Ht 67.0 in | Wt 172.5 lb

## 2020-09-22 DIAGNOSIS — E538 Deficiency of other specified B group vitamins: Secondary | ICD-10-CM | POA: Diagnosis not present

## 2020-09-22 DIAGNOSIS — C184 Malignant neoplasm of transverse colon: Secondary | ICD-10-CM | POA: Diagnosis not present

## 2020-09-22 MED ORDER — CYANOCOBALAMIN 1000 MCG/ML IJ SOLN
INTRAMUSCULAR | Status: AC
  Filled 2020-09-22: qty 1

## 2020-09-22 MED ORDER — CYANOCOBALAMIN 1000 MCG/ML IJ SOLN
1000.0000 ug | Freq: Once | INTRAMUSCULAR | Status: AC
  Administered 2020-09-22: 1000 ug via INTRAMUSCULAR

## 2020-09-22 NOTE — Patient Instructions (Signed)
Vitamin B12 Injection What is this medication? Vitamin B12 (VAHY tuh min B12) prevents and treats low vitamin B12 levels in your body. It is used in people who do not get enough vitamin B12 from their diet or when their digestive tract does not absorb enough. Vitamin B12 plays an important role in maintaining the health of your nervous system and red bloodcells. This medicine may be used for other purposes; ask your health care provider orpharmacist if you have questions. COMMON BRAND NAME(S): B-12 Compliance Kit, B-12 Injection Kit, Cyomin, LA-12,Nutri-Twelve, Physicians EZ Use B-12, Primabalt What should I tell my care team before I take this medication? They need to know if you have any of these conditions: Kidney disease Leber's disease Megaloblastic anemia An unusual or allergic reaction to cyanocobalamin, cobalt, other medications, foods, dyes, or preservatives Pregnant or trying to get pregnant Breast-feeding How should I use this medication? This medication is injected into a muscle or deeply under the skin. It is usually given in a clinic or care team's office. However, your care team mayteach you how to inject yourself. Follow all instructions. Talk to your care team about the use of this medication in children. Specialcare may be needed. Overdosage: If you think you have taken too much of this medicine contact apoison control center or emergency room at once. NOTE: This medicine is only for you. Do not share this medicine with others. What if I miss a dose? If you are given your dose at a clinic or care team's office, call to reschedule your appointment. If you give your own injections, and you miss a dose, take it as soon as you can. If it is almost time for your next dose, takeonly that dose. Do not take double or extra doses. What may interact with this medication? Colchicine Heavy alcohol intake This list may not describe all possible interactions. Give your health care provider  a list of all the medicines, herbs, non-prescription drugs, or dietary supplements you use. Also tell them if you smoke, drink alcohol, or use illegaldrugs. Some items may interact with your medicine. What should I watch for while using this medication? Visit your care team regularly. You may need blood work done while you aretaking this medication. You may need to follow a special diet. Talk to your care team. Limit youralcohol intake and avoid smoking to get the best benefit. What side effects may I notice from receiving this medication? Side effects that you should report to your care team as soon as possible: Allergic reactions-skin rash, itching, hives, swelling of the face, lips, tongue, or throat Swelling of the ankles, hands, or feet Trouble breathing Side effects that usually do not require medical attention (report to your careteam if they continue or are bothersome): Diarrhea This list may not describe all possible side effects. Call your doctor for medical advice about side effects. You may report side effects to FDA at1-800-FDA-1088. Where should I keep my medication? Keep out of the reach of children. Store at room temperature between 15 and 30 degrees C (59 and 85 degrees F).Protect from light. Throw away any unused medication after the expiration date. NOTE: This sheet is a summary. It may not cover all possible information. If you have questions about this medicine, talk to your doctor, pharmacist, orhealth care provider.  2022 Elsevier/Gold Standard (2020-04-07 11:47:06)  

## 2020-09-30 ENCOUNTER — Telehealth: Payer: Self-pay | Admitting: Cardiology

## 2020-09-30 NOTE — Telephone Encounter (Signed)
Called patient, she was informed there are not any results to report to her yet. She states "well when ya'll find out just let me know."

## 2020-09-30 NOTE — Telephone Encounter (Signed)
New message ° ° ° °Patient calling for results: °

## 2020-10-17 NOTE — Telephone Encounter (Signed)
Spoke with pt and gave her the results of her segmental pressures with exercise per [facility]. Pt verbalized understanding and had no additional questions.

## 2020-10-20 ENCOUNTER — Inpatient Hospital Stay: Payer: Medicare HMO | Attending: Oncology

## 2020-10-20 ENCOUNTER — Other Ambulatory Visit: Payer: Self-pay

## 2020-10-20 VITALS — BP 144/69 | HR 76 | Temp 98.0°F | Resp 18 | Ht 67.0 in | Wt 173.8 lb

## 2020-10-20 DIAGNOSIS — E538 Deficiency of other specified B group vitamins: Secondary | ICD-10-CM | POA: Diagnosis not present

## 2020-10-20 MED ORDER — CYANOCOBALAMIN 1000 MCG/ML IJ SOLN
1000.0000 ug | Freq: Once | INTRAMUSCULAR | Status: AC
  Administered 2020-10-20: 1000 ug via INTRAMUSCULAR
  Filled 2020-10-20: qty 1

## 2020-10-20 NOTE — Patient Instructions (Signed)
Vitamin B12 Injection What is this medication? Vitamin B12 (VAHY tuh min B12) prevents and treats low vitamin B12 levels in your body. It is used in people who do not get enough vitamin B12 from their diet or when their digestive tract does not absorb enough. Vitamin B12 plays an important role in maintaining the health of your nervous system and red bloodcells. This medicine may be used for other purposes; ask your health care provider orpharmacist if you have questions. COMMON BRAND NAME(S): B-12 Compliance Kit, B-12 Injection Kit, Cyomin, LA-12,Nutri-Twelve, Physicians EZ Use B-12, Primabalt What should I tell my care team before I take this medication? They need to know if you have any of these conditions: Kidney disease Leber's disease Megaloblastic anemia An unusual or allergic reaction to cyanocobalamin, cobalt, other medications, foods, dyes, or preservatives Pregnant or trying to get pregnant Breast-feeding How should I use this medication? This medication is injected into a muscle or deeply under the skin. It is usually given in a clinic or care team's office. However, your care team mayteach you how to inject yourself. Follow all instructions. Talk to your care team about the use of this medication in children. Specialcare may be needed. Overdosage: If you think you have taken too much of this medicine contact apoison control center or emergency room at once. NOTE: This medicine is only for you. Do not share this medicine with others. What if I miss a dose? If you are given your dose at a clinic or care team's office, call to reschedule your appointment. If you give your own injections, and you miss a dose, take it as soon as you can. If it is almost time for your next dose, takeonly that dose. Do not take double or extra doses. What may interact with this medication? Colchicine Heavy alcohol intake This list may not describe all possible interactions. Give your health care provider  a list of all the medicines, herbs, non-prescription drugs, or dietary supplements you use. Also tell them if you smoke, drink alcohol, or use illegaldrugs. Some items may interact with your medicine. What should I watch for while using this medication? Visit your care team regularly. You may need blood work done while you aretaking this medication. You may need to follow a special diet. Talk to your care team. Limit youralcohol intake and avoid smoking to get the best benefit. What side effects may I notice from receiving this medication? Side effects that you should report to your care team as soon as possible: Allergic reactions-skin rash, itching, hives, swelling of the face, lips, tongue, or throat Swelling of the ankles, hands, or feet Trouble breathing Side effects that usually do not require medical attention (report to your careteam if they continue or are bothersome): Diarrhea This list may not describe all possible side effects. Call your doctor for medical advice about side effects. You may report side effects to FDA at1-800-FDA-1088. Where should I keep my medication? Keep out of the reach of children. Store at room temperature between 15 and 30 degrees C (59 and 85 degrees F).Protect from light. Throw away any unused medication after the expiration date. NOTE: This sheet is a summary. It may not cover all possible information. If you have questions about this medicine, talk to your doctor, pharmacist, orhealth care provider.  2022 Elsevier/Gold Standard (2020-04-07 11:47:06)  

## 2020-11-17 ENCOUNTER — Inpatient Hospital Stay: Payer: Medicare HMO | Attending: Oncology

## 2020-11-17 ENCOUNTER — Other Ambulatory Visit: Payer: Self-pay

## 2020-11-17 VITALS — BP 146/61 | HR 76 | Temp 98.1°F | Resp 18 | Ht 67.0 in | Wt 170.0 lb

## 2020-11-17 DIAGNOSIS — E538 Deficiency of other specified B group vitamins: Secondary | ICD-10-CM

## 2020-11-17 MED ORDER — CYANOCOBALAMIN 1000 MCG/ML IJ SOLN
1000.0000 ug | Freq: Once | INTRAMUSCULAR | Status: AC
  Administered 2020-11-17: 1000 ug via INTRAMUSCULAR
  Filled 2020-11-17: qty 1

## 2020-11-17 NOTE — Progress Notes (Signed)
Patient refused flu shot today. 

## 2020-11-17 NOTE — Patient Instructions (Signed)

## 2020-11-17 NOTE — Progress Notes (Signed)
NA

## 2020-11-26 ENCOUNTER — Other Ambulatory Visit: Payer: Self-pay | Admitting: Physician Assistant

## 2020-12-15 ENCOUNTER — Other Ambulatory Visit: Payer: Self-pay

## 2020-12-15 ENCOUNTER — Inpatient Hospital Stay: Payer: Medicare HMO | Attending: Oncology

## 2020-12-15 VITALS — BP 145/81 | HR 80 | Temp 98.2°F | Resp 18 | Ht 67.0 in | Wt 171.0 lb

## 2020-12-15 DIAGNOSIS — E538 Deficiency of other specified B group vitamins: Secondary | ICD-10-CM | POA: Insufficient documentation

## 2020-12-15 MED ORDER — CYANOCOBALAMIN 1000 MCG/ML IJ SOLN
1000.0000 ug | Freq: Once | INTRAMUSCULAR | Status: AC
  Administered 2020-12-15: 1000 ug via INTRAMUSCULAR
  Filled 2020-12-15: qty 1

## 2020-12-15 NOTE — Patient Instructions (Signed)
Vitamin B12 Deficiency Vitamin B12 deficiency means that your body does not have enough vitamin B12. The body needs this vitamin: To make red blood cells. To make genes (DNA). To help the nerves work. If you do not have enough vitamin B12 in your body, you can have health problems. What are the causes? Not eating enough foods that contain vitamin B12. Not being able to absorb vitamin B12 from the food that you eat. Certain digestive system diseases. A condition in which the body does not make enough of a certain protein, which results in too few red blood cells (pernicious anemia). Having a surgery in which part of the stomach or small intestine is removed. Taking medicines that make it hard for the body to absorb vitamin B12. These medicines include: Heartburn medicines. Some antibiotic medicines. Other medicines that are used to treat certain conditions. What increases the risk? Being older than age 50. Eating a vegetarian or vegan diet, especially while you are pregnant. Eating a poor diet while you are pregnant. Taking certain medicines. Having alcoholism. What are the signs or symptoms? In some cases, there are no symptoms. If the condition leads to too few blood cells or nerve damage, symptoms can occur, such as: Feeling weak. Feeling tired (fatigued). Not being hungry. Weight loss. A loss of feeling (numbness) or tingling in your hands and feet. Redness and burning of the tongue. Being mixed up (confused) or having memory problems. Sadness (depression). Problems with your senses. This can include color blindness, ringing in the ears, or loss of taste. Watery poop (diarrhea) or trouble pooping (constipation). Trouble walking. If anemia is very bad, symptoms can include: Being short of breath. Being dizzy. Having a very fast heartbeat. How is this treated? Changing the way you eat and drink, such as: Eating more foods that contain vitamin B12. Drinking little or no  alcohol. Getting vitamin B12 shots. Taking vitamin B12 supplements. Your doctor will tell you the dose that is best for you. Follow these instructions at home: Eating and drinking  Eat lots of healthy foods that contain vitamin B12. These include: Meats and poultry, such as beef, pork, chicken, turkey, and organ meats, such as liver. Seafood, such as clams, rainbow trout, salmon, tuna, and haddock. Eggs. Cereal and dairy products that have vitamin B12 added to them. Check the label. The items listed above may not be a complete list of what you can eat and drink. Contact a dietitian for more options. General instructions Get any shots as told by your doctor. Take supplements only as told by your doctor. Do not drink alcohol if your doctor tells you not to. In some cases, you may only be asked to limit alcohol use. Keep all follow-up visits as told by your doctor. This is important. Contact a doctor if: Your symptoms come back. Get help right away if: You have trouble breathing. You have a very fast heartbeat. You have chest pain. You get dizzy. You pass out. Summary Vitamin B12 deficiency means that your body is not getting enough vitamin B12. In some cases, there are no symptoms of this condition. Treatment may include making a change in the way you eat and drink, getting vitamin B12 shots, or taking supplements. Eat lots of healthy foods that contain vitamin B12. This information is not intended to replace advice given to you by your health care provider. Make sure you discuss any questions you have with your health care provider. Document Revised: 10/25/2017 Document Reviewed: 10/25/2017 Elsevier Patient Education    2022 Elsevier Inc.  

## 2020-12-30 ENCOUNTER — Encounter: Payer: Self-pay | Admitting: Family Medicine

## 2020-12-30 ENCOUNTER — Other Ambulatory Visit: Payer: Self-pay

## 2020-12-30 ENCOUNTER — Ambulatory Visit (INDEPENDENT_AMBULATORY_CARE_PROVIDER_SITE_OTHER): Payer: Medicare HMO | Admitting: Family Medicine

## 2020-12-30 VITALS — BP 148/72 | HR 72 | Temp 97.4°F | Ht 67.0 in | Wt 175.0 lb

## 2020-12-30 DIAGNOSIS — M79605 Pain in left leg: Secondary | ICD-10-CM | POA: Diagnosis not present

## 2020-12-30 DIAGNOSIS — M79604 Pain in right leg: Secondary | ICD-10-CM | POA: Diagnosis not present

## 2020-12-30 DIAGNOSIS — I129 Hypertensive chronic kidney disease with stage 1 through stage 4 chronic kidney disease, or unspecified chronic kidney disease: Secondary | ICD-10-CM

## 2020-12-30 DIAGNOSIS — E538 Deficiency of other specified B group vitamins: Secondary | ICD-10-CM

## 2020-12-30 DIAGNOSIS — R21 Rash and other nonspecific skin eruption: Secondary | ICD-10-CM | POA: Diagnosis not present

## 2020-12-30 DIAGNOSIS — I700 Atherosclerosis of aorta: Secondary | ICD-10-CM

## 2020-12-30 DIAGNOSIS — N1832 Chronic kidney disease, stage 3b: Secondary | ICD-10-CM | POA: Diagnosis not present

## 2020-12-30 DIAGNOSIS — E782 Mixed hyperlipidemia: Secondary | ICD-10-CM

## 2020-12-30 MED ORDER — TRIAMCINOLONE ACETONIDE 40 MG/ML IJ SUSP
80.0000 mg | Freq: Once | INTRAMUSCULAR | Status: AC
  Administered 2020-12-30: 80 mg via INTRAMUSCULAR

## 2020-12-30 NOTE — Progress Notes (Signed)
Subjective:  Patient ID: Rebecca Orr, female    DOB: 1940-05-23  Age: 80 y.o. MRN: [id number]  Chief Complaint  Patient presents with   Hyperlipidemia   Gastroesophageal Reflux   HPI: LEG PAIN: BL leg pain. Cramping in legs. We have tried muscle relaxants, tonic water, hylands leg cramps. Hyland's may help. ABI at rest and with exercise were normal. Getting b12 shots monthly. She cannot tell any difference in how she feels. After 30 minutes, she feels as if her legs are going to buckle after working in the yard or walking. She has had physical therapy. NCV in 2019 was normal.   Hyperlipidemia Associated symptoms include myalgias. Pertinent negatives include no chest pain or shortness of breath.  Gastroesophageal Reflux She reports no abdominal pain, no chest pain, no coughing, no nausea, no sore throat or no wheezing. Pertinent negatives include no fatigue.    Hypertensive ckd: Not on any bp medicines.  Complaining of stye on left eye x 2 days.  Complaining of rash on chest and abdomen. Itchy. Not hives.   No current outpatient medications on file prior to visit.   No current facility-administered medications on file prior to visit.   Past Medical History:  Diagnosis Date   Acute pain of left knee 08/20/2019   Acute pain of right knee 08/20/2019   Anemia 07/08/2015   B12 deficiency 12/13/2019   Cancer ([place])    Colon cancer 2017    Cholecystitis 08/08/2015   Chronic renal impairment, stage 3b ([place]) 08/20/2019   Dermatitis 05/20/2020   Essential hypertension 03/16/2018   GERD (gastroesophageal reflux disease)    Heartburn 07/08/2015   Hematochezia 07/08/2015   Intermittent claudication ([place]) 08/13/2020   Iron deficiency anemia    Pain of lower extremity 05/20/2020   Palpitations 03/16/2018   Paresthesias 08/20/2019   Postoperative examination 09/01/2015   Renal stone    Transient ischemic attack    Past Surgical History:  Procedure Laterality Date    CATARACT EXTRACTION     CHOLECYSTECTOMY     HEMICOLECTOMY  2017   SPHINCTEROTOMY  2011    Family History  Problem Relation Age of Onset   Dementia Mother    Lung cancer Father    Heart disease Father    Renal cancer Sister    Prostate cancer Brother    Breast cancer Paternal Grandmother    Social History   Socioeconomic History   Marital status: Widowed    Spouse name: Not on file   Number of children: Not on file   Years of education: Not on file   Highest education level: Not on file  Occupational History   Not on file  Tobacco Use   Smoking status: Never   Smokeless tobacco: Never  Vaping Use   Vaping Use: Never used  Substance and Sexual Activity   Alcohol use: Never   Drug use: Never   Sexual activity: Not on file  Other Topics Concern   Not on file  Social History Narrative   Not on file   Social Determinants of Health   Financial Resource Strain: Not on file  Food Insecurity: Not on file  Transportation Needs: Not on file  Physical Activity: Not on file  Stress: Not on file  Social Connections: Not on file    Review of Systems  Constitutional:  Negative for appetite change, fatigue and fever.  HENT:  Negative for congestion, ear pain, sinus pressure and sore throat.   Eyes:  Negative  for pain.  Respiratory:  Negative for cough, chest tightness, shortness of breath and wheezing.   Cardiovascular:  Negative for chest pain and palpitations.  Gastrointestinal:  Negative for abdominal pain, constipation, diarrhea, nausea and vomiting.  Genitourinary:  Negative for dysuria and hematuria.  Musculoskeletal:  Positive for back pain and myalgias. Negative for arthralgias and joint swelling.  Skin:  Positive for rash (on abdomen and chest. Improved since cooler weather. No creams tried.).  Neurological:  Negative for dizziness, weakness and headaches.  Psychiatric/Behavioral:  Negative for dysphoric mood. The patient is not nervous/anxious.     Objective:   BP (!) 148/72 (BP Location: Right Arm, Patient Position: Sitting)   Pulse 72   Temp (!) 97.4 F (36.3 C) (Temporal)   Ht 5\' 7"  (1.702 m)   Wt 175 lb (79.4 kg)   SpO2 99%   BMI 27.41 kg/m   BP/Weight 12/30/2020 12/15/2020 7/89/3810  Systolic BP 175 102 585  Diastolic BP 72 81 61  Wt. (Lbs) 175 171 170  BMI 27.41 26.78 26.63    Physical Exam Vitals reviewed.  Constitutional:      Appearance: Normal appearance. She is normal weight.  Neck:     Vascular: No carotid bruit.  Cardiovascular:     Rate and Rhythm: Normal rate and regular rhythm.     Pulses: Normal pulses.     Heart sounds: Normal heart sounds.  Pulmonary:     Effort: Pulmonary effort is normal. No respiratory distress.     Breath sounds: Normal breath sounds.  Abdominal:     General: Abdomen is flat. Bowel sounds are normal.     Palpations: Abdomen is soft.     Tenderness: There is no abdominal tenderness.  Skin:    Findings: Rash (macular rash on chest and on lower abdomen. Not intertrigo) present.  Neurological:     Mental Status: She is alert and oriented to person, place, and time.  Psychiatric:        Mood and Affect: Mood normal.        Behavior: Behavior normal.    Diabetic Foot Exam - Simple   No data filed      Lab Results  Component Value Date   WBC 5.0 12/30/2020   HGB 11.4 12/30/2020   HCT 34.0 12/30/2020   PLT 189 12/30/2020   GLUCOSE 109 (H) 12/30/2020   CHOL 197 12/30/2020   TRIG 81 12/30/2020   HDL 70 12/30/2020   LDLCALC 112 (H) 12/30/2020   ALT 18 12/30/2020   AST 27 12/30/2020   NA 143 12/30/2020   K 4.5 12/30/2020   CL 106 12/30/2020   CREATININE 1.49 (H) 12/30/2020   BUN 21 12/30/2020   CO2 23 12/30/2020   TSH 2.510 12/30/2020   HGBA1C 5.6 07/30/2020      Assessment & Plan:   Problem List Items Addressed This Visit       Cardiovascular and Mediastinum   Abdominal aortic atherosclerosis ([place])    Recommended statin medicine, but pt will not take until legs  stop hurting. She is concerned it will get worse.        Musculoskeletal and Integument   Rash    Kenalog 80 mg IM.        Genitourinary   Stage 3b chronic kidney disease ([place]) - Primary    Unchanged.      Relevant Orders   CBC with Differential/Platelet (Completed)   Comprehensive metabolic panel (Completed)   Hypertensive renal disease  Bp is too high still. Improved on second bp check.          Other   B12 deficiency    Continue b12 shots monthly.      Pain of lower extremity    Concerned about mild neuropathy. Recommend lyrica 25 mg one three times a day.  Follow up in 1 month.       Relevant Orders   Sedimentation rate (Completed)   TSH (Completed)   CK (Completed)   Mixed hyperlipidemia    Refuses statins      Relevant Orders   CBC with Differential/Platelet (Completed)   Comprehensive metabolic panel (Completed)   Lipid panel (Completed)  .  Meds ordered this encounter  Medications   triamcinolone acetonide (KENALOG-40) injection 80 mg    Orders Placed This Encounter  Procedures   CBC with Differential/Platelet   Comprehensive metabolic panel   Lipid panel   Sedimentation rate   TSH   CK     Follow-up: Return in about 4 weeks (around 01/27/2021) for chronic follow up.  An After Visit Summary was printed and given to the patient.  Total time spent on today's visit was greater than 60 minutes, including both face-to-face time and nonface-to-face time personally spent on review of chart (labs and imaging), discussing labs and goals, discussing further work-up, treatment options, referrals to specialist if needed, reviewing outside records of pertinent, answering patient's questions, and coordinating care.    I,Lauren M Auman,acting as a scribe for Rochel Brome, MD.,have documented all relevant documentation on the behalf of Rochel Brome, MD,as directed by  Rochel Brome, MD while in the presence of Rochel Brome, MD.    Rochel Brome, MD [place] [phone]

## 2020-12-31 LAB — CBC WITH DIFFERENTIAL/PLATELET
Basophils Absolute: 0.1 10*3/uL (ref 0.0–0.2)
Basos: 1 %
EOS (ABSOLUTE): 0.4 10*3/uL (ref 0.0–0.4)
Eos: 9 %
Hematocrit: 34 % (ref 34.0–46.6)
Hemoglobin: 11.4 g/dL (ref 11.1–15.9)
Immature Grans (Abs): 0 10*3/uL (ref 0.0–0.1)
Immature Granulocytes: 0 %
Lymphocytes Absolute: 1.4 10*3/uL (ref 0.7–3.1)
Lymphs: 28 %
MCH: 29.8 pg (ref 26.6–33.0)
MCHC: 33.5 g/dL (ref 31.5–35.7)
MCV: 89 fL (ref 79–97)
Monocytes Absolute: 0.5 10*3/uL (ref 0.1–0.9)
Monocytes: 10 %
Neutrophils Absolute: 2.6 10*3/uL (ref 1.4–7.0)
Neutrophils: 52 %
Platelets: 189 10*3/uL (ref 150–450)
RBC: 3.82 x10E6/uL (ref 3.77–5.28)
RDW: 12.4 % (ref 11.7–15.4)
WBC: 5 10*3/uL (ref 3.4–10.8)

## 2020-12-31 LAB — COMPREHENSIVE METABOLIC PANEL
ALT: 18 IU/L (ref 0–32)
AST: 27 IU/L (ref 0–40)
Albumin/Globulin Ratio: 2 (ref 1.2–2.2)
Albumin: 4.2 g/dL (ref 3.7–4.7)
Alkaline Phosphatase: 72 IU/L (ref 44–121)
BUN/Creatinine Ratio: 14 (ref 12–28)
BUN: 21 mg/dL (ref 8–27)
Bilirubin Total: 0.4 mg/dL (ref 0.0–1.2)
CO2: 23 mmol/L (ref 20–29)
Calcium: 9.8 mg/dL (ref 8.7–10.3)
Chloride: 106 mmol/L (ref 96–106)
Creatinine, Ser: 1.49 mg/dL — ABNORMAL HIGH (ref 0.57–1.00)
Globulin, Total: 2.1 g/dL (ref 1.5–4.5)
Glucose: 109 mg/dL — ABNORMAL HIGH (ref 70–99)
Potassium: 4.5 mmol/L (ref 3.5–5.2)
Sodium: 143 mmol/L (ref 134–144)
Total Protein: 6.3 g/dL (ref 6.0–8.5)
eGFR: 35 mL/min/{1.73_m2} — ABNORMAL LOW (ref >59)

## 2020-12-31 LAB — LIPID PANEL
Chol/HDL Ratio: 2.8 ratio (ref 0.0–4.4)
Cholesterol, Total: 197 mg/dL (ref 100–199)
HDL: 70 mg/dL (ref >39)
LDL Chol Calc (NIH): 112 mg/dL — ABNORMAL HIGH (ref 0–99)
Triglycerides: 81 mg/dL (ref 0–149)
VLDL Cholesterol Cal: 15 mg/dL (ref 5–40)

## 2020-12-31 LAB — TSH: TSH: 2.51 u[IU]/mL (ref 0.450–4.500)

## 2020-12-31 LAB — SEDIMENTATION RATE: Sed Rate: 14 mm/hr (ref 0–40)

## 2020-12-31 LAB — CK: Total CK: 150 U/L (ref 32–182)

## 2020-12-31 NOTE — Progress Notes (Signed)
Blood count normal.  Liver function normal.  Kidney function abnormal, but stable Thyroid function normal.  Cholesterol: LDL 112. Little high. Sed rate, tsh, CK normal. Recommend trial on lyrica 25 mg one three times a day. 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 Despite her normal NCV study done a couple of years ago, it is possible to have a mild neuropathy that is not detectable. Recommend follow up in 1 month. Please let her know this is a very low dose and we may need to increase it.

## 2021-01-01 ENCOUNTER — Other Ambulatory Visit: Payer: Self-pay

## 2021-01-01 DIAGNOSIS — I129 Hypertensive chronic kidney disease with stage 1 through stage 4 chronic kidney disease, or unspecified chronic kidney disease: Secondary | ICD-10-CM

## 2021-01-01 DIAGNOSIS — E782 Mixed hyperlipidemia: Secondary | ICD-10-CM

## 2021-01-01 DIAGNOSIS — R21 Rash and other nonspecific skin eruption: Secondary | ICD-10-CM

## 2021-01-01 HISTORY — DX: Hypertensive chronic kidney disease with stage 1 through stage 4 chronic kidney disease, or unspecified chronic kidney disease: I12.9

## 2021-01-01 HISTORY — DX: Rash and other nonspecific skin eruption: R21

## 2021-01-01 HISTORY — DX: Mixed hyperlipidemia: E78.2

## 2021-01-01 MED ORDER — PREGABALIN 25 MG PO CAPS: 25.0000 mg | ORAL_CAPSULE | Freq: Three times a day (TID) | ORAL | 0 refills | Status: DC

## 2021-01-01 NOTE — Assessment & Plan Note (Signed)
Concerned about mild neuropathy. Recommend lyrica 25 mg one three times a day.  Follow up in 1 month.

## 2021-01-01 NOTE — Assessment & Plan Note (Signed)
Refuses statins

## 2021-01-01 NOTE — Assessment & Plan Note (Signed)
Bp is too high still. Improved on second bp check.

## 2021-01-01 NOTE — Assessment & Plan Note (Signed)
Continue b12 shots monthly. °

## 2021-01-01 NOTE — Assessment & Plan Note (Signed)
Unchanged

## 2021-01-01 NOTE — Assessment & Plan Note (Signed)
Kenalog 80 mg IM.

## 2021-01-01 NOTE — Assessment & Plan Note (Signed)
Recommended statin medicine, but pt will not take until legs stop hurting. She is concerned it will get worse.

## 2021-01-06 ENCOUNTER — Telehealth: Payer: Self-pay

## 2021-01-06 NOTE — Telephone Encounter (Signed)
Patient has been approved for the Pregablin 25mg  through patient's insurance.

## 2021-01-12 ENCOUNTER — Inpatient Hospital Stay: Payer: Medicare HMO | Attending: Oncology

## 2021-01-12 ENCOUNTER — Inpatient Hospital Stay: Payer: Medicare HMO

## 2021-01-12 ENCOUNTER — Other Ambulatory Visit: Payer: Self-pay

## 2021-01-12 ENCOUNTER — Encounter: Payer: Self-pay | Admitting: Hematology and Oncology

## 2021-01-12 ENCOUNTER — Inpatient Hospital Stay (INDEPENDENT_AMBULATORY_CARE_PROVIDER_SITE_OTHER): Payer: Medicare HMO | Admitting: Hematology and Oncology

## 2021-01-12 VITALS — BP 154/66 | HR 66 | Temp 97.7°F | Resp 18 | Ht 67.0 in | Wt 173.0 lb

## 2021-01-12 VITALS — BP 168/72 | HR 64 | Temp 97.7°F | Resp 18 | Ht 67.0 in | Wt 172.9 lb

## 2021-01-12 DIAGNOSIS — D649 Anemia, unspecified: Secondary | ICD-10-CM | POA: Diagnosis not present

## 2021-01-12 DIAGNOSIS — Z85038 Personal history of other malignant neoplasm of large intestine: Secondary | ICD-10-CM | POA: Diagnosis not present

## 2021-01-12 DIAGNOSIS — E538 Deficiency of other specified B group vitamins: Secondary | ICD-10-CM

## 2021-01-12 DIAGNOSIS — C184 Malignant neoplasm of transverse colon: Secondary | ICD-10-CM

## 2021-01-12 DIAGNOSIS — D509 Iron deficiency anemia, unspecified: Secondary | ICD-10-CM | POA: Diagnosis not present

## 2021-01-12 LAB — HEPATIC FUNCTION PANEL
ALT: 20 (ref 7–35)
ALT: 20 (ref 7–35)
AST: 32 (ref 13–35)
AST: 32 (ref 13–35)
Alkaline Phosphatase: 61 (ref 25–125)
Alkaline Phosphatase: 61 (ref 25–125)
Bilirubin, Total: 0.5
Bilirubin, Total: 0.5

## 2021-01-12 LAB — CBC AND DIFFERENTIAL
HCT: 36 (ref 36–46)
Hemoglobin: 12 (ref 12.0–16.0)
Neutrophils Absolute: 3.25
Platelets: 214 (ref 150–399)
WBC: 5.6

## 2021-01-12 LAB — BASIC METABOLIC PANEL
BUN: 26 — AB (ref 4–21)
BUN: 26 — AB (ref 4–21)
CO2: 27 — AB (ref 13–22)
CO2: 27 — AB (ref 13–22)
Chloride: 108 (ref 99–108)
Chloride: 108 (ref 99–108)
Creatinine: 1.4 — AB (ref 0.5–1.1)
Creatinine: 1.4 — AB (ref 0.5–1.1)
Glucose: 94
Glucose: 94
Potassium: 4.1 (ref 3.4–5.3)
Potassium: 4.1 (ref 3.4–5.3)
Sodium: 141 (ref 137–147)
Sodium: 141 (ref 137–147)

## 2021-01-12 LAB — COMPREHENSIVE METABOLIC PANEL
Albumin: 4.3 (ref 3.5–5.0)
Albumin: 4.3 (ref 3.5–5.0)
Calcium: 9.3 (ref 8.7–10.7)
Calcium: 9.3 (ref 8.7–10.7)

## 2021-01-12 LAB — CBC
MCV: 90 (ref 81–99)
RBC: 4.01 (ref 3.87–5.11)

## 2021-01-12 MED ORDER — CYANOCOBALAMIN 1000 MCG/ML IJ SOLN
1000.0000 ug | Freq: Once | INTRAMUSCULAR | Status: AC
  Administered 2021-01-12: 1000 ug via INTRAMUSCULAR
  Filled 2021-01-12: qty 1

## 2021-01-12 NOTE — Patient Instructions (Signed)

## 2021-01-12 NOTE — Progress Notes (Signed)
1312:PT STABLE AT TIME OF DISCHARGE

## 2021-01-12 NOTE — Assessment & Plan Note (Signed)
She will continue B12 injections every 28 days. We will plan to see her back in 1 year with a CBC and comprehensive metabolic panel for repeat clinical assesment.

## 2021-01-12 NOTE — Assessment & Plan Note (Addendum)
Colonoscopy in January 2021 did not reveal any evidence of recurrence. Follow up in 5 years was recommended. She remains without evidence of recurrence. We will plan to see her back in 1 year with a CEA.

## 2021-01-12 NOTE — Progress Notes (Signed)
[facility]  [address] [place],  [place]  [address] [phone]  Clinic Day:  01/12/2021  Referring physician: Rochel Brome, MD  ASSESSMENT & PLAN:   Assessment & Plan: B12 deficiency She will continue B12 injections every 28 days. We will plan to see her back in 1 year with a CBC and comprehensive metabolic panel for repeat clinical assesment.  Personal history of colon cancer, stage I Colonoscopy in January 2021 did not reveal any evidence of recurrence. Follow up in 5 years was recommended. She remains without evidence of recurrence. We will plan to see her back in 1 year with a CEA.    The patient understands the plans discussed today and is in agreement with them.  She knows to contact our office if she develops concerns prior to her next appointment.    Marvia Pickles, PA-C  [facility] AT [facility] [address] [place] [address] Dept: [phone] Dept Fax: [phone]   Orders Placed This Encounter  Procedures   CBC with Differential ([place] Only)    Standing Status:   Standing    Number of Occurrences:   33    Standing Expiration Date:   01/12/2022   CBC and differential    This external order was created through the Results Console.   CBC    This external order was created through the Results Console.   Basic metabolic panel    This external order was created through the Results Console.   Comprehensive metabolic panel    This external order was created through the Results Console.   Hepatic function panel    This external order was created through the Results Console.   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  This was found during evaluation of iron deficiency.  Preoperative CT chest, abdomen and pelvis revealed a lobular mass in the transverse colon at the hepatic flexure, a 4 mm right upper lobe pulmonary nodule, felt to likely be benign, as well as bilateral ovarian cysts and cholelithiasis with mild distention of the gallbladder. CEA was within normal limits.  The patient underwent surgical resection of her colon cancer, as well as cholecystectomy, in June 2017.  Pathology revealed a 3.5 cm, grade 1, adenocarcinoma of the transverse colon with microscopic invasion of the muscularis propria.  Twelve nodes were negative for metastasis.  Pathology of the gallbladder revealed chronic cholecystitis and cholelithiasis.  Mismatch repair testing revealed loss of nuclear expression of MLH1 and PMS2 in less than 5% of the tumor.  Microsatellite instability was high.  BRAF V600E testing was therefore done and found to be positive.  A positive BRAF is indicative of sporadic microsatellite instability, so it is unlikely that this patient has Lynch syndrome.  Her family history is positive for her brother having prostate cancer in his late 60's, sister with renal cancer in her 27's and a maternal uncle with brain cancer in his 78's.  She was unable to tell us what type of brain tumor or kidney cancer was present, or the Gleason score of  her brother's prostate cancer.  There was also a paternal grandmother who had breast cancer in her 81's.  She does not meet criteria for testing for hereditary cancer syndromes due to lack of information, so we have not pursued genetic testing.  Due to the early stage of her cancer, she did not require adjuvant chemotherapy.     She had elevation of her CEA to 5.6 in October 2017, so CT chest, abdomen and pelvis was obtained.  This did not reveal any evidence of malignancy.  The previously noted pulmonary  nodule was stable.  She is a nonsmoker.  Her hemoglobin had returned to normal, so her iron supplement was discontinued in October 2017.  At her visit in December 2017, she had recurrent mild anemia with a hemoglobin of 11.6.  She had persistent anemia at her visit in October 2018 and was found to have vitamin B12 deficiency, so she was placed on B12 injections, which she continues.  The CEA has fluctuated up and down between 4.4 and 5 since December 2017 and is not felt to be significant.  She had a colonoscopy in August 2018 by Dr. Noberto Retort, which was negative.  She had an MRI brain, ENT, neurologic and cardiology evaluation and no specific etiology was found for her dizziness.  Annual screening bilateral mammogram and bone density scan in January 2021 were normal.  She underwent colonoscopy with Dr. Lyda Jester in January 2021, which revealed diverticular disease right colon, and internal and external hemorrhoids, but was otherwise negative.  He told her she could repeat this in 5 years.  She underwent EGD back in November 2020, which revealed erosive duodenitis, gastritis, and a hiatal hernia.   INTERVAL HISTORY:  Levander Orr is here today for repeat clinical assessment. She continues B12 injections monthly.  She underwent CT abdomen and pelvis in February for left upper quadrant pain.  This did not reveal any evidence of recurrence.  Sigmoid diverticulitis was seen. She denies continued abdominal pain any changes in her bowels, melena or hematochezia. She denies fevers or chills. Her appetite is good. Her weight has been stable.  She reports burning stinging cramping in her feet and legs.  She reports occasional discomfort in her right lower back with standing for length of time, which is chronic and unchanged. She had a previous nerve conduction study, which was normal.  Since her last visit, she had lower extremity ABI, which were normal. She has been given pregabalin for peripheral neuropathy, but has not  started that yet as she has a trip planned and does not wish to experience sedation from the medication. She states she is up-to-date on screening mammogram.  REVIEW OF SYSTEMS:  Review of Systems  Constitutional:  Negative for appetite change, chills, fatigue, fever and unexpected weight change.  HENT:   Negative for lump/mass, mouth sores and sore throat.   Respiratory:  Negative for cough and shortness of breath.   Cardiovascular:  Negative for chest pain and leg swelling.  Gastrointestinal:  Negative for abdominal pain, constipation, diarrhea, nausea and vomiting.  Endocrine: Negative for hot flashes.  Genitourinary:  Negative for difficulty urinating, dysuria, frequency and hematuria.   Musculoskeletal:  Positive for back pain (intermittent, mild right lower back). Negative for arthralgias and myalgias.  Skin:  Negative for rash.  Neurological:  Negative for dizziness and headaches.  Hematological:  Negative for adenopathy. Does not bruise/bleed easily.  Psychiatric/Behavioral:  Negative for depression and sleep disturbance. The patient is not nervous/anxious.  VITALS:  Blood pressure (!) 168/72, pulse 64, temperature 97.7 F (36.5 C), resp. rate 18, height '5\' 7"'  (1.702 m), weight 172 lb 14.4 oz (78.4 kg), SpO2 100 %.  Wt Readings from Last 3 Encounters:  01/12/21 172 lb 14.4 oz (78.4 kg)  12/30/20 175 lb (79.4 kg)  12/15/20 171 lb (77.6 kg)    Body mass index is 27.08 kg/m.  Performance status (ECOG): 1 - Symptomatic but completely ambulatory  PHYSICAL EXAM:  Physical Exam Vitals and nursing note reviewed.  Constitutional:      General: She is not in acute distress.    Appearance: Normal appearance.  HENT:     Head: Normocephalic and atraumatic.     Mouth/Throat:     Mouth: Mucous membranes are moist.     Pharynx: Oropharynx is clear. No oropharyngeal exudate or posterior oropharyngeal erythema.  Eyes:     General: No scleral icterus.    Extraocular Movements:  Extraocular movements intact.     Conjunctiva/sclera: Conjunctivae normal.     Pupils: Pupils are equal, round, and reactive to light.  Cardiovascular:     Rate and Rhythm: Normal rate and regular rhythm.     Heart sounds: Normal heart sounds. No murmur heard.   No friction rub. No gallop.  Pulmonary:     Effort: Pulmonary effort is normal.     Breath sounds: Normal breath sounds. No wheezing, rhonchi or rales.  Abdominal:     General: There is no distension.     Palpations: Abdomen is soft. There is no mass.     Tenderness: There is no abdominal tenderness.  Musculoskeletal:        General: Normal range of motion.     Cervical back: Normal range of motion and neck supple. No tenderness.     Right lower leg: No edema.     Left lower leg: No edema.  Lymphadenopathy:     Cervical: No cervical adenopathy.  Skin:    General: Skin is warm and dry.     Coloration: Skin is not jaundiced.     Findings: No rash.  Neurological:     Mental Status: She is alert and oriented to person, place, and time.     Cranial Nerves: No cranial nerve deficit.  Psychiatric:        Mood and Affect: Mood normal.        Behavior: Behavior normal.        Thought Content: Thought content normal.    LABS:   CBC Latest Ref Rng & Units 01/12/2021 12/30/2020 07/30/2020  WBC - 5.6 5.0 4.8  Hemoglobin 12.0 - 16.0 12.0 11.4 12.0  Hematocrit 36 - 46 36 34.0 36.2  Platelets 150 - 399 214 189 194   CMP Latest Ref Rng & Units 01/12/2021 01/12/2021 12/30/2020  Glucose 70 - 99 mg/dL - - 109(H)  BUN 4 - 21 26(A) 26(A) 21  Creatinine 0.5 - 1.1 1.4(A) 1.4(A) 1.49(H)  Sodium 137 - 147 141 141 143  Potassium 3.4 - 5.3 4.1 4.1 4.5  Chloride 99 - 108 108 108 106  CO2 13 - 22 27(A) 27(A) 23  Calcium 8.7 - 10.7 9.3 9.3 9.8  Total Protein 6.0 - 8.5 g/dL - - 6.3  Total Bilirubin 0.0 - 1.2 mg/dL - - 0.4  Alkaline Phos 25 - 125 61 61 72  AST 13 - 35 32 32 27  ALT 7 - 35 '20 20 18     ' Lab Results  Component Value  Date    CEA1 4.4 01/14/2020   /  CEA  Date Value Ref Range Status  01/14/2020 4.4 0.0 - 4.7 ng/mL Final    Comment:    (NOTE)                             Nonsmokers          <3.9                             Smokers             <5.6 [employer] Electrochemiluminescence Immunoassay (ECLIA) Values obtained with different assay methods or kits cannot be used interchangeably.  Results cannot be interpreted as absolute evidence of the presence or absence of malignant disease. Performed At: [facility] [place], [place] [id number] Rush Farmer MD GE:[id number]    No results found for: PSA1 No results found for: YTK354 No results found for: CAN125  No results found for: TOTALPROTELP, ALBUMINELP, A1GS, A2GS, BETS, BETA2SER, GAMS, MSPIKE, SPEI Lab Results  Component Value Date   FERRITIN 28 08/20/2019   No results found for: LDH  STUDIES:  No results found.    HISTORY:   Past Medical History:  Diagnosis Date   Acute pain of left knee 08/20/2019   Acute pain of right knee 08/20/2019   Anemia 07/08/2015   B12 deficiency 12/13/2019   Cancer ([place])    Colon cancer 2017    Cholecystitis 08/08/2015   Chronic renal impairment, stage 3b ([place]) 08/20/2019   Dermatitis 05/20/2020   Essential hypertension 03/16/2018   GERD (gastroesophageal reflux disease)    Heartburn 07/08/2015   Hematochezia 07/08/2015   Intermittent claudication ([place]) 08/13/2020   Iron deficiency anemia    Pain of lower extremity 05/20/2020   Palpitations 03/16/2018   Paresthesias 08/20/2019   Personal history of colon cancer, stage I 07/18/2015   Postoperative examination 09/01/2015   Renal stone    Transient ischemic attack     Past Surgical History:  Procedure Laterality Date   CATARACT EXTRACTION     CHOLECYSTECTOMY     HEMICOLECTOMY  2017   SPHINCTEROTOMY  2011    Family History  Problem Relation Age of Onset   Dementia Mother    Lung cancer Father    Heart disease  Father    Renal cancer Sister    Prostate cancer Brother    Breast cancer Paternal Grandmother     Social History:  reports that she has never smoked. She has never used smokeless tobacco. She reports that she does not drink alcohol and does not use drugs.The patient is alone today.  Allergies:  Allergies  Allergen Reactions   Nsaids Other (See Comments)    GI Bleed   Asa [Aspirin]    Atenolol    Naproxen    Propranolol Other (See Comments)    Unknown   Tizanidine     Dizziness    Current Medications: Current Outpatient Medications  Medication Sig Dispense Refill   pregabalin (LYRICA) 25 MG capsule Take 1 capsule (25 mg total) by mouth 3 (three) times daily. 90 capsule 0   No current facility-administered medications for this visit.

## 2021-01-13 LAB — CEA: CEA: 6 ng/mL — ABNORMAL HIGH (ref 0.0–4.7)

## 2021-01-14 ENCOUNTER — Other Ambulatory Visit: Payer: Self-pay | Admitting: Hematology and Oncology

## 2021-01-14 DIAGNOSIS — Z85038 Personal history of other malignant neoplasm of large intestine: Secondary | ICD-10-CM

## 2021-01-29 ENCOUNTER — Encounter: Payer: Self-pay | Admitting: Family Medicine

## 2021-01-29 ENCOUNTER — Other Ambulatory Visit: Payer: Self-pay

## 2021-01-29 ENCOUNTER — Ambulatory Visit (INDEPENDENT_AMBULATORY_CARE_PROVIDER_SITE_OTHER): Payer: Medicare HMO | Admitting: Family Medicine

## 2021-01-29 VITALS — BP 124/70 | HR 76 | Temp 97.3°F | Resp 14 | Ht 67.0 in | Wt 175.0 lb

## 2021-01-29 DIAGNOSIS — G603 Idiopathic progressive neuropathy: Secondary | ICD-10-CM

## 2021-01-29 DIAGNOSIS — I10 Essential (primary) hypertension: Secondary | ICD-10-CM | POA: Diagnosis not present

## 2021-01-29 HISTORY — DX: Idiopathic progressive neuropathy: G60.3

## 2021-01-29 MED ORDER — PREGABALIN 25 MG PO CAPS: 25.0000 mg | ORAL_CAPSULE | Freq: Three times a day (TID) | ORAL | 0 refills | Status: DC

## 2021-01-29 NOTE — Assessment & Plan Note (Signed)
Improving!  Continue lyrica 25 mg one three times a day. May choose to titrate up.

## 2021-01-29 NOTE — Progress Notes (Signed)
Subjective:  Patient ID: Rebecca Orr, female    DOB: 1940/06/29  Age: 80 y.o. MRN: [id number]  Chief Complaint  Patient presents with   Chronic Kidney Disease   Hyperlipidemia   Gastroesophageal Reflux    HPI History of hypertension has been on numerous medications over the years including amlodipine, lisinopril, hydralazine, atenolol, propranolol.  She does not remember if she was intolerant to all of these or just some of them.  I have that she did not tolerate beta-blockers well.  Her blood pressure at home is usually in the low 140s and then she rechecks significant drops into the 130s.  Leg pain is better on Lyrica.  We have been searching to figure out why her legs hurt for 2 to 3 years without success in diagnosis or treatment.  Her nerve conduction study was normal however a low level neuropathy may be what has been going on since she has significantly improved with Lyrica.  She does report having some diarrhea and wants to stay at this current dose for a while. Current Outpatient Medications on File Prior to Visit  Medication Sig Dispense Refill   pregabalin (LYRICA) 25 MG capsule Take 1 capsule (25 mg total) by mouth 3 (three) times daily. 90 capsule 0   No current facility-administered medications on file prior to visit.   Past Medical History:  Diagnosis Date   Acute pain of left knee 08/20/2019   Acute pain of right knee 08/20/2019   Anemia 07/08/2015   B12 deficiency 12/13/2019   Cancer ([place])    Colon cancer 2017    Cholecystitis 08/08/2015   Chronic renal impairment, stage 3b ([place]) 08/20/2019   Dermatitis 05/20/2020   Essential hypertension 03/16/2018   GERD (gastroesophageal reflux disease)    Heartburn 07/08/2015   Hematochezia 07/08/2015   Intermittent claudication ([place]) 08/13/2020   Iron deficiency anemia    Pain of lower extremity 05/20/2020   Palpitations 03/16/2018   Paresthesias 08/20/2019   Personal history of colon cancer, stage I  07/18/2015   Postoperative examination 09/01/2015   Renal stone    Transient ischemic attack    Past Surgical History:  Procedure Laterality Date   CATARACT EXTRACTION     CHOLECYSTECTOMY     HEMICOLECTOMY  2017   SPHINCTEROTOMY  2011    Family History  Problem Relation Age of Onset   Dementia Mother    Lung cancer Father    Heart disease Father    Renal cancer Sister    Prostate cancer Brother    Breast cancer Paternal Grandmother    Social History   Socioeconomic History   Marital status: Widowed    Spouse name: Not on file   Number of children: Not on file   Years of education: Not on file   Highest education level: Not on file  Occupational History   Not on file  Tobacco Use   Smoking status: Never   Smokeless tobacco: Never  Vaping Use   Vaping Use: Never used  Substance and Sexual Activity   Alcohol use: Never   Drug use: Never   Sexual activity: Not on file  Other Topics Concern   Not on file  Social History Narrative   Not on file   Social Determinants of Health   Financial Resource Strain: Not on file  Food Insecurity: Not on file  Transportation Needs: Not on file  Physical Activity: Not on file  Stress: Not on file  Social Connections: Not on file  Review of Systems  Constitutional:  Negative for chills, fatigue and fever.  HENT:  Positive for sore throat. Negative for congestion and rhinorrhea.   Respiratory:  Negative for cough and shortness of breath.   Cardiovascular:  Negative for chest pain.  Gastrointestinal:  Negative for abdominal pain, constipation, diarrhea, nausea and vomiting.  Genitourinary:  Negative for dysuria and urgency.  Musculoskeletal:  Positive for myalgias (leg cramps). Negative for back pain.  Skin:  Positive for rash.  Neurological:  Negative for dizziness, weakness, light-headedness and headaches.  Psychiatric/Behavioral:  Negative for dysphoric mood. The patient is not nervous/anxious.     Objective:  BP  124/70   Pulse 76   Temp (!) 97.3 F (36.3 C)   Resp 14   Ht 5\' 7"  (1.702 m)   Wt 175 lb (79.4 kg)   BMI 27.41 kg/m   BP/Weight 01/29/2021 01/12/2021 96/22/2979  Systolic BP 892 119 417  Diastolic BP 70 66 72  Wt. (Lbs) 175 173 172.9  BMI 27.41 27.1 27.08    Physical Exam Vitals reviewed.  Constitutional:      Appearance: Normal appearance. She is normal weight.  HENT:     Mouth/Throat:     Pharynx: No oropharyngeal exudate or posterior oropharyngeal erythema.  Neck:     Vascular: No carotid bruit.  Cardiovascular:     Rate and Rhythm: Normal rate and regular rhythm.     Heart sounds: Normal heart sounds.  Pulmonary:     Effort: Pulmonary effort is normal. No respiratory distress.     Breath sounds: No rales.  Abdominal:     General: Abdomen is flat. Bowel sounds are normal.     Palpations: Abdomen is soft.     Tenderness: There is no abdominal tenderness.  Neurological:     Mental Status: She is alert and oriented to person, place, and time.  Psychiatric:        Mood and Affect: Mood normal.        Behavior: Behavior normal.    Diabetic Foot Exam - Simple   No data filed      Lab Results  Component Value Date   WBC 5.6 01/12/2021   HGB 12.0 01/12/2021   HCT 36 01/12/2021   PLT 214 01/12/2021   GLUCOSE 109 (H) 12/30/2020   CHOL 197 12/30/2020   TRIG 81 12/30/2020   HDL 70 12/30/2020   LDLCALC 112 (H) 12/30/2020   ALT 20 01/12/2021   ALT 20 01/12/2021   AST 32 01/12/2021   AST 32 01/12/2021   NA 141 01/12/2021   NA 141 01/12/2021   K 4.1 01/12/2021   K 4.1 01/12/2021   CL 108 01/12/2021   CL 108 01/12/2021   CREATININE 1.4 (A) 01/12/2021   CREATININE 1.4 (A) 01/12/2021   BUN 26 (A) 01/12/2021   BUN 26 (A) 01/12/2021   CO2 27 (A) 01/12/2021   CO2 27 (A) 01/12/2021   TSH 2.510 12/30/2020   HGBA1C 5.6 07/30/2020      Assessment & Plan:   Problem List Items Addressed This Visit       Cardiovascular and Mediastinum   Essential  hypertension    White coat bp. No meds needed at this time        Nervous and Auditory   Idiopathic progressive neuropathy - Primary    Improving!  Continue lyrica 25 mg one three times a day. May choose to titrate up.     .   Follow-up: Return in  about 3 months (around 04/29/2021) for chronic fasting, AWV needed before the end of the year .  An After Visit Summary was printed and given to the patient.  Rochel Brome, MD [facility] [phone]

## 2021-01-29 NOTE — Assessment & Plan Note (Signed)
White coat bp. No meds needed at this time

## 2021-02-09 ENCOUNTER — Other Ambulatory Visit: Payer: Self-pay

## 2021-02-09 ENCOUNTER — Inpatient Hospital Stay: Payer: Medicare HMO | Attending: Oncology

## 2021-02-09 VITALS — BP 135/61 | HR 70 | Temp 97.8°F | Resp 18 | Ht 67.0 in | Wt 177.5 lb

## 2021-02-09 DIAGNOSIS — E538 Deficiency of other specified B group vitamins: Secondary | ICD-10-CM

## 2021-02-09 MED ORDER — CYANOCOBALAMIN 1000 MCG/ML IJ SOLN
1000.0000 ug | Freq: Once | INTRAMUSCULAR | Status: AC
  Administered 2021-02-09: 1000 ug via INTRAMUSCULAR
  Filled 2021-02-09: qty 1

## 2021-02-09 NOTE — Patient Instructions (Signed)

## 2021-02-10 ENCOUNTER — Ambulatory Visit (INDEPENDENT_AMBULATORY_CARE_PROVIDER_SITE_OTHER): Payer: Medicare HMO | Admitting: Nurse Practitioner

## 2021-02-10 ENCOUNTER — Encounter: Payer: Self-pay | Admitting: Nurse Practitioner

## 2021-02-10 VITALS — BP 134/70 | HR 70 | Temp 97.1°F | Ht 67.75 in | Wt 176.0 lb

## 2021-02-10 DIAGNOSIS — Z0000 Encounter for general adult medical examination without abnormal findings: Secondary | ICD-10-CM

## 2021-02-10 NOTE — Patient Instructions (Addendum)
Pt to call to call to schedule follow-up with Dr Tobie Poet after finishing Lyrica (approximately mid-January) Bring copy of advance directives to office at next visit  Preventive Care 65 Years and Older, Female Preventive care refers to lifestyle choices and visits with your health care provider that can promote health and wellness. Preventive care visits are also called wellness exams. What can I expect for my preventive care visit? Counseling Your health care provider may ask you questions about your: Medical history, including: Past medical problems. Family medical history. Pregnancy and menstrual history. History of falls. Current health, including: Memory and ability to understand (cognition). Emotional well-being. Home life and relationship well-being. Sexual activity and sexual health. Lifestyle, including: Alcohol, nicotine or tobacco, and drug use. Access to firearms. Diet, exercise, and sleep habits. Work and work Statistician. Sunscreen use. Safety issues such as seatbelt and bike helmet use. Physical exam Your health care provider will check your: Height and weight. These may be used to calculate your BMI (body mass index). BMI is a measurement that tells if you are at a healthy weight. Waist circumference. This measures the distance around your waistline. This measurement also tells if you are at a healthy weight and may help predict your risk of certain diseases, such as type 2 diabetes and high blood pressure. Heart rate and blood pressure. Body temperature. Skin for abnormal spots. What immunizations do I need? Vaccines are usually given at various ages, according to a schedule. Your health care provider will recommend vaccines for you based on your age, medical history, and lifestyle or other factors, such as travel or where you work. What tests do I need? Screening Your health care provider may recommend screening tests for certain conditions. This may include: Lipid  and cholesterol levels. Hepatitis C test. Hepatitis B test. HIV (human immunodeficiency virus) test. STI (sexually transmitted infection) testing, if you are at risk. Lung cancer screening. Colorectal cancer screening. Diabetes screening. This is done by checking your blood sugar (glucose) after you have not eaten for a while (fasting). Mammogram. Talk with your health care provider about how often you should have regular mammograms. BRCA-related cancer screening. This may be done if you have a family history of breast, ovarian, tubal, or peritoneal cancers. Bone density scan. This is done to screen for osteoporosis. Talk with your health care provider about your test results, treatment options, and if necessary, the need for more tests. Follow these instructions at home: Eating and drinking  Eat a diet that includes fresh fruits and vegetables, whole grains, lean protein, and low-fat dairy products. Limit your intake of foods with high amounts of sugar, saturated fats, and salt. Take vitamin and mineral supplements as recommended by your health care provider. Do not drink alcohol if your health care provider tells you not to drink. If you drink alcohol: Limit how much you have to 0-1 drink a day. Know how much alcohol is in your drink. In the [place] one drink equals one 12 oz bottle of beer (355 mL), one 5 oz glass of wine (148 mL), or one 1 oz glass of hard liquor (44 mL). Lifestyle Brush your teeth every morning and night with fluoride toothpaste. Floss one time each day. Exercise for at least 30 minutes 5 or more days each week. Do not use any products that contain nicotine or tobacco. These products include cigarettes, chewing tobacco, and vaping devices, such as e-cigarettes. If you need help quitting, ask your health care provider. Do not use drugs. If  you are sexually active, practice safe sex. Use a condom or other form of protection in order to prevent STIs. Take aspirin only as  told by your health care provider. Make sure that you understand how much to take and what form to take. Work with your health care provider to find out whether it is safe and beneficial for you to take aspirin daily. Ask your health care provider if you need to take a cholesterol-lowering medicine (statin). Find healthy ways to manage stress, such as: Meditation, yoga, or listening to music. Journaling. Talking to a trusted person. Spending time with friends and family. Minimize exposure to UV radiation to reduce your risk of skin cancer. Safety Always wear your seat belt while driving or riding in a vehicle. Do not drive: If you have been drinking alcohol. Do not ride with someone who has been drinking. When you are tired or distracted. While texting. If you have been using any mind-altering substances or drugs. Wear a helmet and other protective equipment during sports activities. If you have firearms in your house, make sure you follow all gun safety procedures. What's next? Visit your health care provider once a year for an annual wellness visit. Ask your health care provider how often you should have your eyes and teeth checked. Stay up to date on all vaccines. This information is not intended to replace advice given to you by your health care provider. Make sure you discuss any questions you have with your health care provider. Document Revised: 08/13/2020 Document Reviewed: 08/13/2020 Elsevier Patient Education  [place]  Use a variety of textures, such as Velcro, rubber bands and raised dots to provide tactile clues.  Apply to the on/off controls on appliances, at the end of the banister, or on medicine bottles.  Flooring  The following suggestions can help reduce the risk of a fall: Repair or replace torn carpet because a foot, cane or walker can easily get caught. Remove area carpets or throw rugs, especially if your loved one has a shuffling  gait or uses a walker. When rugs  Or carpeting cannot be eliminated, place non-skid padding under rugs or secure to floor with double sided tape.  Area carpets without padding or tape can easily buckle underneath when walked on, causing a person to slip or fall. Use only matte, non-shiny finishes on the floor. Doorsills can be tripping hazards.  Remove them whenever possible or paint them a contrasting color. Eliminate low furniture that is easy to trip over such as coffee tables and footstools. Move furniture against walls to create a large area of uncluttered space in the center of the room.  However, if you are rearranging a room for someone else, discuss beforehand with that person.  Many individuals rely on specific locations of furniture to find their way around a room. It is easier to see the sofa or chair when its color contrasts with that of the flooring.  Choose a fabric that contrasts with the floor material or use a bright colored piping along the edges of the seat cushion. Reduce glare on polished furniture by covering it with a large doily or tablecloth.

## 2021-02-10 NOTE — Progress Notes (Signed)
Subjective:   Rebecca Orr is a 80 y.o. female who presents for Medicare Annual (Subsequent) preventive examination.  Review of Systems    Leg cramps, all other systems negative Cardiac Risk Factors include: advanced age >25 women     Objective:   BP 134/70    Pulse 70    Temp (!) 97.1 F (36.2 C)    Ht 5' 7.75" (1.721 m)    Wt 176 lb (79.8 kg)    SpO2 100%    BMI 26.96 kg/m   Today's Vitals   02/10/21 1022 02/10/21 1034  Weight: 176 lb (79.8 kg)   Height: 5' 7.75" (1.721 m)   PainSc:  0-No pain   Body mass index is 26.96 kg/m.  Advanced Directives 02/10/2021 01/30/2020 01/30/2020  Does Patient Have a Medical Advance Directive? Yes Yes Yes  Type of Paramedic of Rebecca Orr;Living will Rebecca Orr;Living will Rebecca Orr;Living will  Does patient want to make changes to medical advance directive? No - Patient declined - -  Copy of [place] in Chart? No - copy requested - -    Current Medications (verified) Outpatient Encounter Medications as of 02/10/2021  Medication Sig   pregabalin (LYRICA) 25 MG capsule Take 1 capsule (25 mg total) by mouth 3 (three) times daily.   No facility-administered encounter medications on file as of 02/10/2021.    Allergies (verified) Nsaids, Asa [aspirin], Atenolol, Naproxen, Propranolol, and Tizanidine   History: Past Medical History:  Diagnosis Date   Acute pain of left knee 08/20/2019   Acute pain of right knee 08/20/2019   Anemia 07/08/2015   B12 deficiency 12/13/2019   Cancer (Rebecca Orr)    Colon cancer 2017    Cholecystitis 08/08/2015   Chronic renal impairment, stage 3b (Rebecca Orr) 08/20/2019   Dermatitis 05/20/2020   Essential hypertension 03/16/2018   GERD (gastroesophageal reflux disease)    Heartburn 07/08/2015   Hematochezia 07/08/2015   Intermittent claudication (Rebecca Orr) 08/13/2020   Iron deficiency anemia    Pain of lower extremity 05/20/2020    Palpitations 03/16/2018   Paresthesias 08/20/2019   Personal history of colon cancer, stage I 07/18/2015   Postoperative examination 09/01/2015   Renal stone    Transient ischemic attack    Past Surgical History:  Procedure Laterality Date   CATARACT EXTRACTION     CHOLECYSTECTOMY     HEMICOLECTOMY  2017   SPHINCTEROTOMY  2011   Family History  Problem Relation Age of Onset   Dementia Mother    Lung cancer Father    Heart disease Father    Renal cancer Sister    Prostate cancer Brother    Breast cancer Paternal Grandmother    Social History   Socioeconomic History   Marital status: Widowed    Spouse name: Not on file   Number of children: Not on file   Years of education: Not on file   Highest education level: Not on file  Occupational History   Not on file  Tobacco Use   Smoking status: Never   Smokeless tobacco: Never  Vaping Use   Vaping Use: Never used  Substance and Sexual Activity   Alcohol use: Never   Drug use: Never   Sexual activity: Not on file  Other Topics Concern   Not on file  Social History Narrative   Not on file   Social Determinants of Health   Financial Resource Strain: Low Risk    Difficulty  of Paying Living Expenses: Not hard at all  Food Insecurity: No Food Insecurity   Worried About Rebecca Orr in the Last Year: Never true   Rebecca Orr in the Last Year: Never true  Transportation Needs: No Transportation Needs   Lack of Transportation (Medical): No   Lack of Transportation (Non-Medical): No  Physical Activity: Insufficiently Active   Days of Exercise per Week: 5 days   Minutes of Exercise per Session: 20 min  Stress: No Stress Concern Present   Feeling of Stress : Not at all  Social Connections: Moderately Integrated   Frequency of Communication with Friends and Family: More than three times a week   Frequency of Social Gatherings with Friends and Family: Three times a week   Attends Religious Services: More than  4 times per year   Active Member of Clubs or Organizations: Yes   Attends Archivist Meetings: 1 to 4 times per year   Marital Status: Widowed    Tobacco Counseling Counseling given: Not applicable   Clinical Intake:  Pre-visit preparation completed: No  Pain : No/denies pain Pain Score: 0-No pain     Nutritional Status: BMI 25 -29 Overweight Nutritional Risks: None Diabetes: No  How often do you need to have someone help you when you read instructions, pamphlets, or other written materials from your doctor or pharmacy?: 1 - Never  Diabetic?NO  Interpreter Needed?: No      Activities of Daily Living In your present state of health, do you have any difficulty performing the following activities: 02/10/2021  Hearing? N  Vision? N  Difficulty concentrating or making decisions? N  Walking or climbing stairs? N  Dressing or bathing? N  Doing errands, shopping? N  Preparing Food and eating ? N  Using the Toilet? N  In the past six months, have you accidently leaked urine? N  Do you have problems with loss of bowel control? N  Managing your Medications? N  Managing your Finances? N  Housekeeping or managing your Housekeeping? N  Some recent data might be hidden    Patient Care Team: Rochel Brome, MD as PCP - General (Family Medicine) Derwood Kaplan, MD as Consulting Physician (Oncology) Marylu Lund., MD as Referring Physician (Surgery) Misenheimer, Christia Reading, MD as Consulting Physician (Gastroenterology)  Indicate any recent Medical Services you may have received from other than Rebecca Orr providers in the past year (date may be approximate).     Assessment:   This is a routine wellness examination for Rebecca Orr.  Hearing/Vision screen Not performed  Dietary issues and exercise activities discussed: Current Exercise Habits: Home exercise routine, Type of exercise: walking, Time (Minutes): 20, Frequency (Times/Week): 5, Weekly Exercise  (Minutes/Week): 100, Intensity: Mild, Exercise limited by: None identified   Goals Addressed   None   Depression Screen PHQ 2/9 Scores 02/10/2021 08/11/2020 02/25/2020 01/30/2020 08/20/2019  PHQ - 2 Score 0 0 0 0 0    Fall Risk Fall Risk  02/10/2021 08/11/2020 07/30/2020 02/25/2020 01/30/2020  Falls in the past year? 0 - 0 0 1  Number falls in past yr: 0 - 0 0 0  Injury with Fall? 0 - 0 0 0  Risk for fall due to : No Fall Risks No Fall Risks - - -  Follow up Falls evaluation completed Falls evaluation completed;Falls prevention discussed - - Falls evaluation completed    FALL RISK PREVENTION PERTAINING TO THE HOME:  Any stairs in or around the home? No  If so, are there any without handrails? No  Home free of loose throw rugs in walkways, pet beds, electrical cords, etc? No  Adequate lighting in your home to reduce risk of falls? Yes   ASSISTIVE DEVICES UTILIZED TO PREVENT FALLS:  Life alert? No  Use of a cane, walker or w/c? No  Grab bars in the bathroom? No  Shower chair or bench in shower? Yes  Elevated toilet seat or a handicapped toilet? No   TIMED UP AND GO:  Was the test performed? No .  Length of time to ambulate 10 feet:  sec.   Gait steady and fast without use of assistive device  Cognitive Function:    normal    Immunizations Immunization History  Administered Date(s) Administered   Moderna Sars-Covid-2 Vaccination 04/27/2019, 05/30/2019, 01/18/2020    TDAP status: Due, Education has been provided regarding the importance of this vaccine. Advised may receive this vaccine at local pharmacy or Health Dept. Aware to provide a copy of the vaccination record if obtained from local pharmacy or Health Dept. Verbalized acceptance and understanding.  Flu Vaccine status: Declined, Education has been provided regarding the importance of this vaccine but patient still declined. Advised may receive this vaccine at local pharmacy or Health Dept. Aware to provide a copy of  the vaccination record if obtained from local pharmacy or Health Dept. Verbalized acceptance and understanding.  Pneumococcal vaccine status: Declined,  Education has been provided regarding the importance of this vaccine but patient still declined. Advised may receive this vaccine at local pharmacy or Health Dept. Aware to provide a copy of the vaccination record if obtained from local pharmacy or Health Dept. Verbalized acceptance and understanding.   Covid-19 vaccine status: Declined, Education has been provided regarding the importance of this vaccine but patient still declined. Advised may receive this vaccine at local pharmacy or Health Dept.or vaccine clinic. Aware to provide a copy of the vaccination record if obtained from local pharmacy or Health Dept. Verbalized acceptance and understanding.  Qualifies for Shingles Vaccine? Yes   Zostavax completed No   Shingrix Completed?: No.    Education has been provided regarding the importance of this vaccine. Patient has been advised to call insurance company to determine out of pocket expense if they have not yet received this vaccine. Advised may also receive vaccine at local pharmacy or Health Dept. Verbalized acceptance and understanding.  Screening Tests Health Maintenance  Topic Date Due   MAMMOGRAM  04/28/2022   DEXA SCAN  Completed   HPV VACCINES  Aged Out   Pneumonia Vaccine 79+ Years old  Discontinued   INFLUENZA VACCINE  Discontinued   TETANUS/TDAP  Discontinued   COVID-19 Vaccine  Discontinued   Zoster Vaccines- Shingrix  Discontinued    Health Maintenance   Colorectal cancer screening: No longer required.   Mammogram status: Completed [id number]. Repeat every year  DEXA:Declined  Lung Cancer Screening: (Low Dose CT Chest recommended if Age 91-80 years, 30 pack-year currently smoking OR have quit w/in 15years.) does not qualify.   Lung Cancer Screening Referral: No  Additional Screening:  Hepatitis C Screening: does  not qualify;   Vision Screening: Recommended annual ophthalmology exams for early detection of glaucoma and other disorders of the eye. Is the patient up to date with their annual eye exam?  Yes  Who is the provider or what is the name of the office in which the patient attends annual eye exams? Dr. Gilford Rile If pt is not established with a  provider, would they like to be referred to a provider to establish care? No .   Dental Screening: Recommended annual dental exams for proper oral hygiene  Community Resource Referral / Chronic Care Management: CRR required this visit?  No   CCM required this visit?  No      Plan:    1. Encounter for Medicare annual wellness exam   I have personally reviewed and noted the following in the patients chart:   Medical and social history Use of alcohol, tobacco or illicit drugs  Current medications and supplements including opioid prescriptions.  Functional ability and status Nutritional status Physical activity Advanced directives List of other physicians Hospitalizations, surgeries, and ER visits in previous 12 months Vitals Screenings to include cognitive, depression, and falls Referrals and appointments    Pt to call to call to schedule follow-up with Dr Tobie Poet after finishing Lyrica (approximately mid-January) Bring copy of advance directives to office at next visit   In addition, I have reviewed and discussed with patient certain preventive protocols, quality metrics, and best practice recommendations. A written personalized care plan for preventive services as well as general preventive health recommendations were provided to patient.   I, Rip Harbour, NP, have reviewed all documentation for this visit. The documentation on 02/10/21 for the exam, diagnosis, procedures, and orders are all accurate and complete.    Jerrell Belfast, DNP   02/10/2021

## 2021-03-09 ENCOUNTER — Other Ambulatory Visit: Payer: Self-pay

## 2021-03-09 ENCOUNTER — Inpatient Hospital Stay: Payer: Medicare HMO | Attending: Oncology

## 2021-03-09 VITALS — BP 144/63 | HR 71 | Resp 17 | Wt 178.0 lb

## 2021-03-09 DIAGNOSIS — E538 Deficiency of other specified B group vitamins: Secondary | ICD-10-CM | POA: Insufficient documentation

## 2021-03-09 DIAGNOSIS — Z85038 Personal history of other malignant neoplasm of large intestine: Secondary | ICD-10-CM | POA: Insufficient documentation

## 2021-03-09 MED ORDER — CYANOCOBALAMIN 1000 MCG/ML IJ SOLN
1000.0000 ug | Freq: Once | INTRAMUSCULAR | Status: AC
  Administered 2021-03-09: 1000 ug via INTRAMUSCULAR
  Filled 2021-03-09: qty 1

## 2021-03-09 NOTE — Progress Notes (Signed)
Patient tolerated Vitamin B injection today, no concerns voiced. Patient discharged, stable.  

## 2021-03-09 NOTE — Patient Instructions (Signed)
Vitamin B12 Injection °What is this medication? °Vitamin B12 (VAHY tuh min B12) prevents and treats low vitamin B12 levels in your body. It is used in people who do not get enough vitamin B12 from their diet or when their digestive tract does not absorb enough. Vitamin B12 plays an important role in maintaining the health of your nervous system and red blood cells. °This medicine may be used for other purposes; ask your health care provider or pharmacist if you have questions. °COMMON BRAND NAME(S): B-12 Compliance Kit, B-12 Injection Kit, Cyomin, Dodex, LA-12, Nutri-Twelve, Physicians EZ Use B-12, Primabalt °What should I tell my care team before I take this medication? °They need to know if you have any of these conditions: °Kidney disease °Leber's disease °Megaloblastic anemia °An unusual or allergic reaction to cyanocobalamin, cobalt, other medications, foods, dyes, or preservatives °Pregnant or trying to get pregnant °Breast-feeding °How should I use this medication? °This medication is injected into a muscle or deeply under the skin. It is usually given in a clinic or care team's office. However, your care team may teach you how to inject yourself. Follow all instructions. °Talk to your care team about the use of this medication in children. Special care may be needed. °Overdosage: If you think you have taken too much of this medicine contact a poison control center or emergency room at once. °NOTE: This medicine is only for you. Do not share this medicine with others. °What if I miss a dose? °If you are given your dose at a clinic or care team's office, call to reschedule your appointment. If you give your own injections, and you miss a dose, take it as soon as you can. If it is almost time for your next dose, take only that dose. Do not take double or extra doses. °What may interact with this medication? °Colchicine °Heavy alcohol intake °This list may not describe all possible interactions. Give your health  care provider a list of all the medicines, herbs, non-prescription drugs, or dietary supplements you use. Also tell them if you smoke, drink alcohol, or use illegal drugs. Some items may interact with your medicine. °What should I watch for while using this medication? °Visit your care team regularly. You may need blood work done while you are taking this medication. °You may need to follow a special diet. Talk to your care team. Limit your alcohol intake and avoid smoking to get the best benefit. °What side effects may I notice from receiving this medication? °Side effects that you should report to your care team as soon as possible: °Allergic reactions--skin rash, itching, hives, swelling of the face, lips, tongue, or throat °Swelling of the ankles, hands, or feet °Trouble breathing °Side effects that usually do not require medical attention (report to your care team if they continue or are bothersome): °Diarrhea °This list may not describe all possible side effects. Call your doctor for medical advice about side effects. You may report side effects to FDA at 1-800-FDA-1088. °Where should I keep my medication? °Keep out of the reach of children. °Store at room temperature between 15 and 30 degrees C (59 and 85 degrees F). Protect from light. Throw away any unused medication after the expiration date. °NOTE: This sheet is a summary. It may not cover all possible information. If you have questions about this medicine, talk to your doctor, pharmacist, or health care provider. °© 2022 Elsevier/Gold Standard (2020-04-30 00:00:00) ° °

## 2021-03-16 ENCOUNTER — Other Ambulatory Visit: Payer: Self-pay

## 2021-03-16 ENCOUNTER — Inpatient Hospital Stay: Payer: Medicare HMO

## 2021-03-16 ENCOUNTER — Other Ambulatory Visit: Payer: Self-pay | Admitting: Hematology and Oncology

## 2021-03-16 DIAGNOSIS — Z85038 Personal history of other malignant neoplasm of large intestine: Secondary | ICD-10-CM

## 2021-03-16 DIAGNOSIS — E538 Deficiency of other specified B group vitamins: Secondary | ICD-10-CM

## 2021-03-16 DIAGNOSIS — D649 Anemia, unspecified: Secondary | ICD-10-CM | POA: Diagnosis not present

## 2021-03-16 DIAGNOSIS — D509 Iron deficiency anemia, unspecified: Secondary | ICD-10-CM | POA: Diagnosis not present

## 2021-03-16 LAB — CBC AND DIFFERENTIAL
HCT: 37 (ref 36–46)
Hemoglobin: 12.3 (ref 12.0–16.0)
Neutrophils Absolute: 3.08
Platelets: 188 (ref 150–399)
WBC: 5.5

## 2021-03-16 LAB — CBC
MCV: 90 (ref 81–99)
RBC: 4.11 (ref 3.87–5.11)

## 2021-03-17 ENCOUNTER — Other Ambulatory Visit: Payer: Self-pay | Admitting: Hematology and Oncology

## 2021-03-17 DIAGNOSIS — Z85038 Personal history of other malignant neoplasm of large intestine: Secondary | ICD-10-CM

## 2021-03-17 DIAGNOSIS — R970 Elevated carcinoembryonic antigen [CEA]: Secondary | ICD-10-CM

## 2021-03-17 LAB — CEA: CEA: 5.8 ng/mL — ABNORMAL HIGH (ref 0.0–4.7)

## 2021-03-17 NOTE — Progress Notes (Signed)
CEA remains mildly elevated. Last CT imaging was in February 2022. Will proceed with CT C/A/P at this time. Patient reports persistent hoarseness. Has seen ENT previously, recommended she call and see again. Patient verbalized understanding.

## 2021-03-24 ENCOUNTER — Other Ambulatory Visit: Payer: Self-pay

## 2021-03-24 DIAGNOSIS — Z1231 Encounter for screening mammogram for malignant neoplasm of breast: Secondary | ICD-10-CM

## 2021-03-30 DIAGNOSIS — K7689 Other specified diseases of liver: Secondary | ICD-10-CM | POA: Diagnosis not present

## 2021-03-30 DIAGNOSIS — I2510 Atherosclerotic heart disease of native coronary artery without angina pectoris: Secondary | ICD-10-CM | POA: Diagnosis not present

## 2021-03-30 DIAGNOSIS — Z85038 Personal history of other malignant neoplasm of large intestine: Secondary | ICD-10-CM | POA: Diagnosis not present

## 2021-03-30 DIAGNOSIS — K449 Diaphragmatic hernia without obstruction or gangrene: Secondary | ICD-10-CM | POA: Diagnosis not present

## 2021-03-30 DIAGNOSIS — R970 Elevated carcinoembryonic antigen [CEA]: Secondary | ICD-10-CM | POA: Diagnosis not present

## 2021-03-30 DIAGNOSIS — I700 Atherosclerosis of aorta: Secondary | ICD-10-CM | POA: Diagnosis not present

## 2021-03-30 DIAGNOSIS — D7389 Other diseases of spleen: Secondary | ICD-10-CM | POA: Diagnosis not present

## 2021-03-30 DIAGNOSIS — C189 Malignant neoplasm of colon, unspecified: Secondary | ICD-10-CM | POA: Diagnosis not present

## 2021-04-01 DIAGNOSIS — R490 Dysphonia: Secondary | ICD-10-CM | POA: Insufficient documentation

## 2021-04-06 ENCOUNTER — Telehealth: Payer: Self-pay

## 2021-04-06 ENCOUNTER — Inpatient Hospital Stay: Payer: Medicare HMO | Attending: Oncology

## 2021-04-06 ENCOUNTER — Other Ambulatory Visit: Payer: Self-pay

## 2021-04-06 VITALS — BP 144/60 | HR 66 | Temp 98.0°F | Resp 18 | Ht 67.0 in | Wt 180.2 lb

## 2021-04-06 DIAGNOSIS — E538 Deficiency of other specified B group vitamins: Secondary | ICD-10-CM | POA: Diagnosis not present

## 2021-04-06 MED ORDER — CYANOCOBALAMIN 1000 MCG/ML IJ SOLN
1000.0000 ug | Freq: Once | INTRAMUSCULAR | Status: AC
  Administered 2021-04-06: 1000 ug via INTRAMUSCULAR
  Filled 2021-04-06: qty 1

## 2021-04-06 NOTE — Telephone Encounter (Signed)
Patient notified

## 2021-04-06 NOTE — Telephone Encounter (Signed)
ERROR

## 2021-04-06 NOTE — Patient Instructions (Signed)
Vitamin B12 Injection °What is this medication? °Vitamin B12 (VAHY tuh min B12) prevents and treats low vitamin B12 levels in your body. It is used in people who do not get enough vitamin B12 from their diet or when their digestive tract does not absorb enough. Vitamin B12 plays an important role in maintaining the health of your nervous system and red blood cells. °This medicine may be used for other purposes; ask your health care provider or pharmacist if you have questions. °COMMON BRAND NAME(S): B-12 Compliance Kit, B-12 Injection Kit, Cyomin, Dodex, LA-12, Nutri-Twelve, Physicians EZ Use B-12, Primabalt °What should I tell my care team before I take this medication? °They need to know if you have any of these conditions: °Kidney disease °Leber's disease °Megaloblastic anemia °An unusual or allergic reaction to cyanocobalamin, cobalt, other medications, foods, dyes, or preservatives °Pregnant or trying to get pregnant °Breast-feeding °How should I use this medication? °This medication is injected into a muscle or deeply under the skin. It is usually given in a clinic or care team's office. However, your care team may teach you how to inject yourself. Follow all instructions. °Talk to your care team about the use of this medication in children. Special care may be needed. °Overdosage: If you think you have taken too much of this medicine contact a poison control center or emergency room at once. °NOTE: This medicine is only for you. Do not share this medicine with others. °What if I miss a dose? °If you are given your dose at a clinic or care team's office, call to reschedule your appointment. If you give your own injections, and you miss a dose, take it as soon as you can. If it is almost time for your next dose, take only that dose. Do not take double or extra doses. °What may interact with this medication? °Colchicine °Heavy alcohol intake °This list may not describe all possible interactions. Give your health  care provider a list of all the medicines, herbs, non-prescription drugs, or dietary supplements you use. Also tell them if you smoke, drink alcohol, or use illegal drugs. Some items may interact with your medicine. °What should I watch for while using this medication? °Visit your care team regularly. You may need blood work done while you are taking this medication. °You may need to follow a special diet. Talk to your care team. Limit your alcohol intake and avoid smoking to get the best benefit. °What side effects may I notice from receiving this medication? °Side effects that you should report to your care team as soon as possible: °Allergic reactions--skin rash, itching, hives, swelling of the face, lips, tongue, or throat °Swelling of the ankles, hands, or feet °Trouble breathing °Side effects that usually do not require medical attention (report to your care team if they continue or are bothersome): °Diarrhea °This list may not describe all possible side effects. Call your doctor for medical advice about side effects. You may report side effects to FDA at 1-800-FDA-1088. °Where should I keep my medication? °Keep out of the reach of children. °Store at room temperature between 15 and 30 degrees C (59 and 85 degrees F). Protect from light. Throw away any unused medication after the expiration date. °NOTE: This sheet is a summary. It may not cover all possible information. If you have questions about this medicine, talk to your doctor, pharmacist, or health care provider. °© 2022 Elsevier/Gold Standard (2020-04-30 00:00:00) ° °

## 2021-04-06 NOTE — Telephone Encounter (Signed)
-----   Message from Marvia Pickles, PA-C sent at 04/03/2021  4:03 PM EST ----- Please let her know CT scans were negative, thanks

## 2021-04-13 ENCOUNTER — Other Ambulatory Visit: Payer: Self-pay | Admitting: Family Medicine

## 2021-04-13 DIAGNOSIS — Z1231 Encounter for screening mammogram for malignant neoplasm of breast: Secondary | ICD-10-CM

## 2021-04-29 ENCOUNTER — Ambulatory Visit
Admission: RE | Admit: 2021-04-29 | Discharge: 2021-04-29 | Disposition: A | Discharge status: Home / Self Care | Payer: Medicare HMO | Source: Ambulatory Visit | Attending: Family Medicine | Admitting: Family Medicine

## 2021-04-29 ENCOUNTER — Other Ambulatory Visit: Payer: Self-pay

## 2021-04-29 DIAGNOSIS — Z1231 Encounter for screening mammogram for malignant neoplasm of breast: Secondary | ICD-10-CM | POA: Diagnosis not present

## 2021-05-01 ENCOUNTER — Ambulatory Visit (INDEPENDENT_AMBULATORY_CARE_PROVIDER_SITE_OTHER): Payer: Medicare HMO | Admitting: Family Medicine

## 2021-05-01 ENCOUNTER — Other Ambulatory Visit: Payer: Self-pay

## 2021-05-01 ENCOUNTER — Encounter: Payer: Self-pay | Admitting: Family Medicine

## 2021-05-01 VITALS — BP 144/76 | HR 76 | Temp 97.0°F | Resp 16 | Ht 66.0 in | Wt 177.0 lb

## 2021-05-01 DIAGNOSIS — G603 Idiopathic progressive neuropathy: Secondary | ICD-10-CM | POA: Diagnosis not present

## 2021-05-01 DIAGNOSIS — B360 Pityriasis versicolor: Secondary | ICD-10-CM

## 2021-05-01 DIAGNOSIS — E782 Mixed hyperlipidemia: Secondary | ICD-10-CM | POA: Diagnosis not present

## 2021-05-01 DIAGNOSIS — Z13820 Encounter for screening for osteoporosis: Secondary | ICD-10-CM

## 2021-05-01 DIAGNOSIS — E538 Deficiency of other specified B group vitamins: Secondary | ICD-10-CM | POA: Diagnosis not present

## 2021-05-01 DIAGNOSIS — I700 Atherosclerosis of aorta: Secondary | ICD-10-CM | POA: Diagnosis not present

## 2021-05-01 DIAGNOSIS — I129 Hypertensive chronic kidney disease with stage 1 through stage 4 chronic kidney disease, or unspecified chronic kidney disease: Secondary | ICD-10-CM

## 2021-05-01 DIAGNOSIS — K219 Gastro-esophageal reflux disease without esophagitis: Secondary | ICD-10-CM

## 2021-05-01 DIAGNOSIS — N1832 Chronic kidney disease, stage 3b: Secondary | ICD-10-CM | POA: Diagnosis not present

## 2021-05-01 DIAGNOSIS — Z780 Asymptomatic menopausal state: Secondary | ICD-10-CM | POA: Insufficient documentation

## 2021-05-01 HISTORY — DX: Encounter for screening for osteoporosis: Z13.820

## 2021-05-01 HISTORY — DX: Pityriasis versicolor: B36.0

## 2021-05-01 MED ORDER — SELENIUM SULFIDE 2.5 % EX LOTN
TOPICAL_LOTION | CUTANEOUS | 0 refills | Status: DC
Start: 2021-05-01 — End: 2021-06-04

## 2021-05-01 MED ORDER — PREGABALIN 25 MG PO CAPS: 25.0000 mg | ORAL_CAPSULE | Freq: Three times a day (TID) | ORAL | 3 refills | Status: DC

## 2021-05-01 NOTE — Progress Notes (Signed)
Subjective:  Patient ID: Rebecca Orr, female    DOB: 1940-10-13  Age: 81 y.o. MRN: [id number]  Chief Complaint  Patient presents with   Hyperlipidemia   Hypertension    HPI Hoarseness/sore throat improved with omeprazole. Taking one month ago.  Has allergies. Has not tried.   Cramps in legs improved. Hylands helps.   Mixed hyperlipidemia/aortic atherosclerosis. LDL 112.   History of colon cancer: Sees Dr. Gilman Buttner. CEA increased. Ct scan of abd/pelvis normal.   Hypertension: 140s/70s. No medicines.   BL knee pain: hurts getting up and down from seated position. Occasionally one leg will go numb. Switches legs and then resolved with continued walking.   B12 deficiency: Getting B12 infusions monthly.  Current Outpatient Medications on File Prior to Visit  Medication Sig Dispense Refill   omeprazole (PRILOSEC) 40 MG capsule Take 40 mg by mouth daily.     No current facility-administered medications on file prior to visit.   Past Medical History:  Diagnosis Date   Acute pain of left knee 08/20/2019   Acute pain of right knee 08/20/2019   Anemia 07/08/2015   B12 deficiency 12/13/2019   Cancer (HCC)    Colon cancer 2017    Cholecystitis 08/08/2015   Chronic renal impairment, stage 3b (HCC) 08/20/2019   Dermatitis 05/20/2020   Essential hypertension 03/16/2018   GERD (gastroesophageal reflux disease)    Heartburn 07/08/2015   Hematochezia 07/08/2015   Intermittent claudication (HCC) 08/13/2020   Iron deficiency anemia    Pain of lower extremity 05/20/2020   Palpitations 03/16/2018   Paresthesias 08/20/2019   Personal history of colon cancer, stage I 07/18/2015   Postoperative examination 09/01/2015   Renal stone    Transient ischemic attack    Past Surgical History:  Procedure Laterality Date   CATARACT EXTRACTION     CHOLECYSTECTOMY     HEMICOLECTOMY  2017   SPHINCTEROTOMY  2011    Family History  Problem Relation Age of Onset   Dementia Mother     Lung cancer Father    Heart disease Father    Renal cancer Sister    Prostate cancer Brother    Breast cancer Paternal Grandmother    Social History   Socioeconomic History   Marital status: Widowed    Spouse name: Not on file   Number of children: Not on file   Years of education: Not on file   Highest education level: Not on file  Occupational History   Not on file  Tobacco Use   Smoking status: Never   Smokeless tobacco: Never  Vaping Use   Vaping Use: Never used  Substance and Sexual Activity   Alcohol use: Never   Drug use: Never   Sexual activity: Not on file  Other Topics Concern   Not on file  Social History Narrative   Not on file   Social Determinants of Health   Financial Resource Strain: Low Risk    Difficulty of Paying Living Expenses: Not hard at all  Food Insecurity: No Food Insecurity   Worried About Running Out of Food in the Last Year: Never true   Ran Out of Food in the Last Year: Never true  Transportation Needs: No Transportation Needs   Lack of Transportation (Medical): No   Lack of Transportation (Non-Medical): No  Physical Activity: Insufficiently Active   Days of Exercise per Week: 5 days   Minutes of Exercise per Session: 20 min  Stress: No Stress Concern Present  Feeling of Stress : Not at all  Social Connections: Moderately Integrated   Frequency of Communication with Friends and Family: More than three times a week   Frequency of Social Gatherings with Friends and Family: Three times a week   Attends Religious Services: More than 4 times per year   Active Member of Clubs or Organizations: Yes   Attends Banker Meetings: 1 to 4 times per year   Marital Status: Widowed    Review of Systems  Constitutional:  Negative for chills, fatigue and fever.  HENT:  Negative for congestion, rhinorrhea and sore throat.   Respiratory:  Negative for cough and shortness of breath.   Cardiovascular:  Negative for chest pain.   Gastrointestinal:  Negative for abdominal pain, constipation, diarrhea, nausea and vomiting.  Genitourinary:  Negative for dysuria and urgency.  Musculoskeletal:  Positive for myalgias. Negative for back pain.  Neurological:  Positive for light-headedness. Negative for dizziness, weakness and headaches.  Psychiatric/Behavioral:  Negative for dysphoric mood. The patient is not nervous/anxious.     Objective:  BP (!) 144/76   Pulse 76   Temp (!) 97 F (36.1 C)   Resp 16   Ht 5\' 6"  (1.676 m)   Wt 177 lb (80.3 kg)   BMI 28.57 kg/m   BP/Weight 05/04/2021 05/01/2021 04/06/2021  Systolic BP 151 144 144  Diastolic BP 66 76 60  Wt. (Lbs) 176.25 177 180.25  BMI 27.4 28.57 28.23    Physical Exam Vitals reviewed.  Constitutional:      Appearance: Normal appearance. She is normal weight.  Neck:     Vascular: No carotid bruit.  Cardiovascular:     Rate and Rhythm: Normal rate and regular rhythm.     Pulses: Normal pulses.     Heart sounds: Normal heart sounds.  Pulmonary:     Effort: Pulmonary effort is normal. No respiratory distress.     Breath sounds: Normal breath sounds.  Abdominal:     General: Abdomen is flat. Bowel sounds are normal.     Palpations: Abdomen is soft.     Tenderness: There is no abdominal tenderness.  Skin:    Findings: Rash (mild discoloration of upper chest, abdomen, and upper back.) present.  Neurological:     Mental Status: She is alert and oriented to person, place, and time.  Psychiatric:        Mood and Affect: Mood normal.        Behavior: Behavior normal.    Diabetic Foot Exam - Simple   No data filed      Lab Results  Component Value Date   WBC 5.5 03/16/2021   HGB 12.3 03/16/2021   HCT 37 03/16/2021   PLT 188 03/16/2021   GLUCOSE 109 (H) 12/30/2020   CHOL 197 12/30/2020   TRIG 81 12/30/2020   HDL 70 12/30/2020   LDLCALC 112 (H) 12/30/2020   ALT 20 01/12/2021   ALT 20 01/12/2021   AST 32 01/12/2021   AST 32 01/12/2021   NA 141  01/12/2021   NA 141 01/12/2021   K 4.1 01/12/2021   K 4.1 01/12/2021   CL 108 01/12/2021   CL 108 01/12/2021   CREATININE 1.4 (A) 01/12/2021   CREATININE 1.4 (A) 01/12/2021   BUN 26 (A) 01/12/2021   BUN 26 (A) 01/12/2021   CO2 27 (A) 01/12/2021   CO2 27 (A) 01/12/2021   TSH 2.510 12/30/2020   HGBA1C 5.6 07/30/2020      Assessment &  Plan:   Problem List Items Addressed This Visit       Cardiovascular and Mediastinum   Abdominal aortic atherosclerosis (HCC)    Recommended to start statin.        Digestive   GERD (gastroesophageal reflux disease)    Recommended to continue with omeprazole. Patient took it for one month.      Relevant Medications   omeprazole (PRILOSEC) 40 MG capsule     Nervous and Auditory   Idiopathic progressive neuropathy - Primary    The current medical regimen is effective;  continue present plan and medications Lyrica 25 mg TID.        Musculoskeletal and Integument   Tinea versicolor    Started Selenium sulfide 2.5% shampoo apply to affected area daily for 7 days. Leave on for 10 min.        Genitourinary   Stage 3b chronic kidney disease (HCC)    Stable.      Hypertensive renal disease    Fair controlled.  Continue healthy diet and exercise.         Other   B12 deficiency    Managed per specialist.      Mixed hyperlipidemia    Recommend continue to work on eating healthy diet and exercise. Refuses Statin.      Encounter for osteoporosis screening in asymptomatic postmenopausal patient    Ordered bone density.      Relevant Orders   DG Bone Density  .  Meds ordered this encounter  Medications   selenium sulfide (SELSUN) 2.5 % shampoo    Sig: Apply to affected area daily for 7 days. Leave on for 10 minutes, then rinse    Dispense:  118 mL    Refill:  0   pregabalin (LYRICA) 25 MG capsule    Sig: Take 1 capsule (25 mg total) by mouth 3 (three) times daily.    Dispense:  270 capsule    Refill:  3    Orders  Placed This Encounter  Procedures   DG Bone Density    Total time spent on today's visit was greater than 30 minutes, including both face-to-face time and nonface-to-face time personally spent on review of chart (labs and imaging), discussing labs and goals, discussing further work-up, treatment options, referrals to specialist if needed, reviewing outside records of pertinent, answering patient's questions, and coordinating care.  Follow-up: Return in about 6 months (around 11/01/2021) for chronic fasting.  An After Visit Summary was printed and given to the patient.  I,Adaisha Campise,acting as a Neurosurgeon for Blane Ohara, MD.,have documented all relevant documentation on the behalf of Blane Ohara, MD,as directed by  Blane Ohara, MD while in the presence of Blane Ohara, MD.   Blane Ohara, MD [facility] [phone]

## 2021-05-02 NOTE — Assessment & Plan Note (Signed)
Fair controlled.  ?Continue healthy diet and exercise.  ?

## 2021-05-02 NOTE — Assessment & Plan Note (Signed)
The current medical regimen is effective;  continue present plan and medications Lyrica 25 mg TID. ?

## 2021-05-02 NOTE — Assessment & Plan Note (Signed)
Recommend continue to work on eating healthy diet and exercise. ?Refuses Statin. ?

## 2021-05-02 NOTE — Assessment & Plan Note (Signed)
Started Selenium sulfide 2.5% shampoo apply to affected area daily for 7 days. Leave on for 10 min. ?

## 2021-05-02 NOTE — Assessment & Plan Note (Signed)
Stable

## 2021-05-02 NOTE — Assessment & Plan Note (Signed)
Managed per specialist. ?

## 2021-05-02 NOTE — Assessment & Plan Note (Signed)
Recommended to continue with omeprazole. Patient took it for one month. ?

## 2021-05-02 NOTE — Assessment & Plan Note (Signed)
Recommended to start statin. ?

## 2021-05-02 NOTE — Assessment & Plan Note (Signed)
Ordered bone density

## 2021-05-04 ENCOUNTER — Other Ambulatory Visit: Payer: Self-pay

## 2021-05-04 ENCOUNTER — Inpatient Hospital Stay: Payer: Medicare HMO | Attending: Oncology

## 2021-05-04 VITALS — BP 151/66 | HR 66 | Temp 98.1°F | Resp 18 | Ht 67.25 in | Wt 176.2 lb

## 2021-05-04 DIAGNOSIS — E538 Deficiency of other specified B group vitamins: Secondary | ICD-10-CM | POA: Diagnosis not present

## 2021-05-04 MED ORDER — CYANOCOBALAMIN 1000 MCG/ML IJ SOLN
1000.0000 ug | Freq: Once | INTRAMUSCULAR | Status: AC
  Administered 2021-05-04: 1000 ug via INTRAMUSCULAR
  Filled 2021-05-04: qty 1

## 2021-05-04 NOTE — Patient Instructions (Signed)
Vitamin B12 Injection ?What is this medication? ?Vitamin B12 (VAHY tuh min B12) prevents and treats low vitamin B12 levels in your body. It is used in people who do not get enough vitamin B12 from their diet or when their digestive tract does not absorb enough. Vitamin B12 plays an important role in maintaining the health of your nervous system and red blood cells. ?This medicine may be used for other purposes; ask your health care provider or pharmacist if you have questions. ?COMMON BRAND NAME(S): B-12 Compliance Kit, B-12 Injection Kit, Cyomin, Dodex, LA-12, Nutri-Twelve, Physicians EZ Use B-12, Primabalt ?What should I tell my care team before I take this medication? ?They need to know if you have any of these conditions: ?Kidney disease ?Leber's disease ?Megaloblastic anemia ?An unusual or allergic reaction to cyanocobalamin, cobalt, other medications, foods, dyes, or preservatives ?Pregnant or trying to get pregnant ?Breast-feeding ?How should I use this medication? ?This medication is injected into a muscle or deeply under the skin. It is usually given in a clinic or care team's office. However, your care team may teach you how to inject yourself. Follow all instructions. ?Talk to your care team about the use of this medication in children. Special care may be needed. ?Overdosage: If you think you have taken too much of this medicine contact a poison control center or emergency room at once. ?NOTE: This medicine is only for you. Do not share this medicine with others. ?What if I miss a dose? ?If you are given your dose at a clinic or care team's office, call to reschedule your appointment. If you give your own injections, and you miss a dose, take it as soon as you can. If it is almost time for your next dose, take only that dose. Do not take double or extra doses. ?What may interact with this medication? ?Colchicine ?Heavy alcohol intake ?This list may not describe all possible interactions. Give your health  care provider a list of all the medicines, herbs, non-prescription drugs, or dietary supplements you use. Also tell them if you smoke, drink alcohol, or use illegal drugs. Some items may interact with your medicine. ?What should I watch for while using this medication? ?Visit your care team regularly. You may need blood work done while you are taking this medication. ?You may need to follow a special diet. Talk to your care team. Limit your alcohol intake and avoid smoking to get the best benefit. ?What side effects may I notice from receiving this medication? ?Side effects that you should report to your care team as soon as possible: ?Allergic reactions--skin rash, itching, hives, swelling of the face, lips, tongue, or throat ?Swelling of the ankles, hands, or feet ?Trouble breathing ?Side effects that usually do not require medical attention (report to your care team if they continue or are bothersome): ?Diarrhea ?This list may not describe all possible side effects. Call your doctor for medical advice about side effects. You may report side effects to FDA at 1-800-FDA-1088. ?Where should I keep my medication? ?Keep out of the reach of children. ?Store at room temperature between 15 and 30 degrees C (59 and 85 degrees F). Protect from light. Throw away any unused medication after the expiration date. ?NOTE: This sheet is a summary. It may not cover all possible information. If you have questions about this medicine, talk to your doctor, pharmacist, or health care provider. ?? 2022 Elsevier/Gold Standard (2020-04-30 00:00:00) ? ?

## 2021-05-11 ENCOUNTER — Encounter: Payer: Self-pay | Admitting: Family Medicine

## 2021-05-27 DIAGNOSIS — H35373 Puckering of macula, bilateral: Secondary | ICD-10-CM | POA: Diagnosis not present

## 2021-05-27 DIAGNOSIS — H5203 Hypermetropia, bilateral: Secondary | ICD-10-CM | POA: Diagnosis not present

## 2021-05-27 DIAGNOSIS — D3131 Benign neoplasm of right choroid: Secondary | ICD-10-CM | POA: Diagnosis not present

## 2021-05-27 DIAGNOSIS — H524 Presbyopia: Secondary | ICD-10-CM | POA: Diagnosis not present

## 2021-05-27 DIAGNOSIS — H353131 Nonexudative age-related macular degeneration, bilateral, early dry stage: Secondary | ICD-10-CM | POA: Diagnosis not present

## 2021-05-27 DIAGNOSIS — H52223 Regular astigmatism, bilateral: Secondary | ICD-10-CM | POA: Diagnosis not present

## 2021-05-27 DIAGNOSIS — Z961 Presence of intraocular lens: Secondary | ICD-10-CM | POA: Diagnosis not present

## 2021-05-27 DIAGNOSIS — Z9849 Cataract extraction status, unspecified eye: Secondary | ICD-10-CM | POA: Diagnosis not present

## 2021-06-01 ENCOUNTER — Inpatient Hospital Stay: Payer: Medicare HMO | Attending: Oncology

## 2021-06-01 VITALS — BP 138/55 | HR 69 | Temp 98.1°F | Resp 18 | Ht 67.25 in | Wt 179.8 lb

## 2021-06-01 DIAGNOSIS — E538 Deficiency of other specified B group vitamins: Secondary | ICD-10-CM | POA: Diagnosis not present

## 2021-06-01 MED ORDER — CYANOCOBALAMIN 1000 MCG/ML IJ SOLN
1000.0000 ug | Freq: Once | INTRAMUSCULAR | Status: AC
  Administered 2021-06-01: 1000 ug via INTRAMUSCULAR
  Filled 2021-06-01: qty 1

## 2021-06-01 NOTE — Patient Instructions (Signed)
Vitamin B12 Injection ?What is this medication? ?Vitamin B12 (VAHY tuh min B12) prevents and treats low vitamin B12 levels in your body. It is used in people who do not get enough vitamin B12 from their diet or when their digestive tract does not absorb enough. Vitamin B12 plays an important role in maintaining the health of your nervous system and red blood cells. ?This medicine may be used for other purposes; ask your health care provider or pharmacist if you have questions. ?COMMON BRAND NAME(S): B-12 Compliance Kit, B-12 Injection Kit, Cyomin, Dodex, LA-12, Nutri-Twelve, Physicians EZ Use B-12, Primabalt ?What should I tell my care team before I take this medication? ?They need to know if you have any of these conditions: ?Kidney disease ?Leber's disease ?Megaloblastic anemia ?An unusual or allergic reaction to cyanocobalamin, cobalt, other medications, foods, dyes, or preservatives ?Pregnant or trying to get pregnant ?Breast-feeding ?How should I use this medication? ?This medication is injected into a muscle or deeply under the skin. It is usually given in a clinic or care team's office. However, your care team may teach you how to inject yourself. Follow all instructions. ?Talk to your care team about the use of this medication in children. Special care may be needed. ?Overdosage: If you think you have taken too much of this medicine contact a poison control center or emergency room at once. ?NOTE: This medicine is only for you. Do not share this medicine with others. ?What if I miss a dose? ?If you are given your dose at a clinic or care team's office, call to reschedule your appointment. If you give your own injections, and you miss a dose, take it as soon as you can. If it is almost time for your next dose, take only that dose. Do not take double or extra doses. ?What may interact with this medication? ?Colchicine ?Heavy alcohol intake ?This list may not describe all possible interactions. Give your health  care provider a list of all the medicines, herbs, non-prescription drugs, or dietary supplements you use. Also tell them if you smoke, drink alcohol, or use illegal drugs. Some items may interact with your medicine. ?What should I watch for while using this medication? ?Visit your care team regularly. You may need blood work done while you are taking this medication. ?You may need to follow a special diet. Talk to your care team. Limit your alcohol intake and avoid smoking to get the best benefit. ?What side effects may I notice from receiving this medication? ?Side effects that you should report to your care team as soon as possible: ?Allergic reactions--skin rash, itching, hives, swelling of the face, lips, tongue, or throat ?Swelling of the ankles, hands, or feet ?Trouble breathing ?Side effects that usually do not require medical attention (report to your care team if they continue or are bothersome): ?Diarrhea ?This list may not describe all possible side effects. Call your doctor for medical advice about side effects. You may report side effects to FDA at 1-800-FDA-1088. ?Where should I keep my medication? ?Keep out of the reach of children. ?Store at room temperature between 15 and 30 degrees C (59 and 85 degrees F). Protect from light. Throw away any unused medication after the expiration date. ?NOTE: This sheet is a summary. It may not cover all possible information. If you have questions about this medicine, talk to your doctor, pharmacist, or health care provider. ?? 2022 Elsevier/Gold Standard (2020-04-30 00:00:00) ? ?

## 2021-06-04 ENCOUNTER — Ambulatory Visit (INDEPENDENT_AMBULATORY_CARE_PROVIDER_SITE_OTHER): Payer: Medicare HMO | Admitting: Family Medicine

## 2021-06-04 ENCOUNTER — Encounter: Payer: Self-pay | Admitting: Family Medicine

## 2021-06-04 VITALS — BP 128/72 | HR 66 | Temp 97.9°F | Resp 16 | Ht 67.0 in | Wt 176.0 lb

## 2021-06-04 DIAGNOSIS — R1012 Left upper quadrant pain: Secondary | ICD-10-CM | POA: Insufficient documentation

## 2021-06-04 DIAGNOSIS — I129 Hypertensive chronic kidney disease with stage 1 through stage 4 chronic kidney disease, or unspecified chronic kidney disease: Secondary | ICD-10-CM

## 2021-06-04 HISTORY — DX: Left upper quadrant pain: R10.12

## 2021-06-04 NOTE — Progress Notes (Signed)
? ?Acute Office Visit ? ?Subjective:  ? ? Patient ID: Rebecca Orr, female    DOB: 1940/10/20, 81 y.o.   MRN: [id number] ? ?Chief Complaint  ?Patient presents with  ? Abdominal Pain  ?  X2 weeks   ? ? ?HPI ?Patient is in today for LUQ abd pain for two weeks. She has been working in the yard recently. Rates pain 3/10. Pain worsens with position. Has not tried any OTC medications to relieve the pain. Is not triggered by food. Intermittent and lasts few minutes. Resolves with position changes. No diarrhea. Had constipation once this week which resolved with fiber bar. No nausea, vomiting or fever.  ? ?Past Medical History:  ?Diagnosis Date  ? Acute pain of left knee 08/20/2019  ? Acute pain of right knee 08/20/2019  ? Anemia 07/08/2015  ? B12 deficiency 12/13/2019  ? Cancer [facility])   ? Colon cancer 2017   ? Cholecystitis 08/08/2015  ? Chronic renal impairment, stage 3b ([place]) 08/20/2019  ? Dermatitis 05/20/2020  ? Essential hypertension 03/16/2018  ? GERD (gastroesophageal reflux disease)   ? Heartburn 07/08/2015  ? Hematochezia 07/08/2015  ? Intermittent claudication ([place]) 08/13/2020  ? Iron deficiency anemia   ? Pain of lower extremity 05/20/2020  ? Palpitations 03/16/2018  ? Paresthesias 08/20/2019  ? Personal history of colon cancer, stage I 07/18/2015  ? Postoperative examination 09/01/2015  ? Renal stone   ? Transient ischemic attack   ? ? ?Past Surgical History:  ?Procedure Laterality Date  ? CATARACT EXTRACTION    ? CHOLECYSTECTOMY    ? HEMICOLECTOMY  2017  ? SPHINCTEROTOMY  2011  ? ? ?Family History  ?Problem Relation Age of Onset  ? Dementia Mother   ? Lung cancer Father   ? Heart disease Father   ? Renal cancer Sister   ? Prostate cancer Brother   ? Breast cancer Paternal Grandmother   ? ? ?Social History  ? ?Socioeconomic History  ? Marital status: Widowed  ?  Spouse name: Not on file  ? Number of children: Not on file  ? Years of education: Not on file  ? Highest education level: Not on file   ?Occupational History  ? Not on file  ?Tobacco Use  ? Smoking status: Never  ? Smokeless tobacco: Never  ?Vaping Use  ? Vaping Use: Never used  ?Substance and Sexual Activity  ? Alcohol use: Never  ? Drug use: Never  ? Sexual activity: Not on file  ?Other Topics Concern  ? Not on file  ?Social History Narrative  ? Not on file  ? ?Social Determinants of Health  ? ?Financial Resource Strain: Low Risk   ? Difficulty of Paying Living Expenses: Not hard at all  ?Food Insecurity: No Food Insecurity  ? Worried About Charity fundraiser in the Last Year: Never true  ? Ran Out of Food in the Last Year: Never true  ?Transportation Needs: No Transportation Needs  ? Lack of Transportation (Medical): No  ? Lack of Transportation (Non-Medical): No  ?Physical Activity: Insufficiently Active  ? Days of Exercise per Week: 5 days  ? Minutes of Exercise per Session: 20 min  ?Stress: No Stress Concern Present  ? Feeling of Stress : Not at all  ?Social Connections: Moderately Integrated  ? Frequency of Communication with Friends and Family: More than three times a week  ? Frequency of Social Gatherings with Friends and Family: Three times a week  ? Attends Religious Services: More than  4 times per year  ? Active Member of Clubs or Organizations: Yes  ? Attends Archivist Meetings: 1 to 4 times per year  ? Marital Status: Widowed  ?Intimate Partner Violence: Not At Risk  ? Fear of Current or Ex-Partner: No  ? Emotionally Abused: No  ? Physically Abused: No  ? Sexually Abused: No  ? ? ?Outpatient Medications Prior to Visit  ?Medication Sig Dispense Refill  ? pregabalin (LYRICA) 25 MG capsule Take 1 capsule (25 mg total) by mouth 3 (three) times daily. 270 capsule 3  ? omeprazole (PRILOSEC) 40 MG capsule Take 40 mg by mouth daily.    ? selenium sulfide (SELSUN) 2.5 % shampoo Apply to affected area daily for 7 days. Leave on for 10 minutes, then rinse 118 mL 0  ? ?No facility-administered medications prior to visit.   ? ? ?Allergies  ?Allergen Reactions  ? Nsaids Other (See Comments)  ?  GI Bleed ?GI Bleed  ? Aspirin Other (See Comments)  ? Atenolol Other (See Comments)  ? Naproxen Other (See Comments)  ? Propranolol Other (See Comments)  ?  Unknown  ? Tizanidine Other (See Comments)  ?  Dizziness ?Dizziness  ? ? ?Review of Systems  ?Constitutional:  Negative for appetite change, fatigue and fever.  ?HENT:  Negative for congestion, ear pain, sinus pressure and sore throat.   ?Respiratory:  Negative for cough, chest tightness, shortness of breath and wheezing.   ?Cardiovascular:  Negative for chest pain and palpitations.  ?Gastrointestinal:  Positive for abdominal pain (LUQ). Negative for constipation, diarrhea, nausea and vomiting.  ?Genitourinary:  Negative for dysuria, flank pain, hematuria and urgency.  ?Musculoskeletal:  Negative for arthralgias, back pain, joint swelling and myalgias.  ?Skin:  Negative for rash.  ?Neurological:  Negative for dizziness, weakness and headaches.  ?Psychiatric/Behavioral:  Negative for dysphoric mood. The patient is not nervous/anxious.   ? ?   ?Objective:  ?  ?Physical Exam ?Vitals reviewed.  ?Constitutional:   ?   Appearance: Normal appearance. She is well-developed.  ?Neck:  ?   Vascular: No carotid bruit.  ?Cardiovascular:  ?   Rate and Rhythm: Normal rate and regular rhythm.  ?   Heart sounds: Normal heart sounds.  ?Pulmonary:  ?   Effort: Pulmonary effort is normal. No respiratory distress.  ?   Breath sounds: Normal breath sounds.  ?Abdominal:  ?   General: Abdomen is flat. Bowel sounds are normal.  ?   Palpations: Abdomen is soft.  ?   Tenderness: There is abdominal tenderness in the left upper quadrant. There is no guarding or rebound.  ?Neurological:  ?   Mental Status: She is alert and oriented to person, place, and time.  ?Psychiatric:     ?   Mood and Affect: Mood normal.     ?   Behavior: Behavior normal.  ? ? ?BP 128/72   Pulse 66   Temp 97.9 ?F (36.6 ?C)   Resp 16   Ht '5\' 7"'   (1.702 m)   Wt 176 lb (79.8 kg)   SpO2 99%   BMI 27.57 kg/m?  ?Wt Readings from Last 3 Encounters:  ?06/04/21 176 lb (79.8 kg)  ?06/01/21 179 lb 12 oz (81.5 kg)  ?05/04/21 176 lb 4 oz (79.9 kg)  ? ? ?There are no preventive care reminders to display for this patient. ? ?There are no preventive care reminders to display for this patient. ? ? ?Lab Results  ?Component Value Date  ? TSH  2.510 12/30/2020  ? ?Lab Results  ?Component Value Date  ? WBC 5.5 03/16/2021  ? HGB 12.3 03/16/2021  ? HCT 37 03/16/2021  ? MCV 90 03/16/2021  ? PLT 188 03/16/2021  ? ?Lab Results  ?Component Value Date  ? NA 141 01/12/2021  ? NA 141 01/12/2021  ? K 4.1 01/12/2021  ? K 4.1 01/12/2021  ? CO2 27 (A) 01/12/2021  ? CO2 27 (A) 01/12/2021  ? GLUCOSE 109 (H) 12/30/2020  ? BUN 26 (A) 01/12/2021  ? BUN 26 (A) 01/12/2021  ? CREATININE 1.4 (A) 01/12/2021  ? CREATININE 1.4 (A) 01/12/2021  ? BILITOT 0.4 12/30/2020  ? ALKPHOS 61 01/12/2021  ? ALKPHOS 61 01/12/2021  ? AST 32 01/12/2021  ? AST 32 01/12/2021  ? ALT 20 01/12/2021  ? ALT 20 01/12/2021  ? PROT 6.3 12/30/2020  ? ALBUMIN 4.3 01/12/2021  ? ALBUMIN 4.3 01/12/2021  ? CALCIUM 9.3 01/12/2021  ? CALCIUM 9.3 01/12/2021  ? ANIONGAP 7 01/14/2020  ? EGFR 35 (L) 12/30/2020  ? ?Lab Results  ?Component Value Date  ? CHOL 197 12/30/2020  ? ?Lab Results  ?Component Value Date  ? HDL 70 12/30/2020  ? ?Lab Results  ?Component Value Date  ? LDLCALC 112 (H) 12/30/2020  ? ?Lab Results  ?Component Value Date  ? TRIG 81 12/30/2020  ? ?Lab Results  ?Component Value Date  ? CHOLHDL 2.8 12/30/2020  ? ?Lab Results  ?Component Value Date  ? HGBA1C 5.6 07/30/2020  ? ? ? ? ?   ?Assessment & Plan:  ? ?Problem List Items Addressed This Visit   ? ?  ? Genitourinary  ? Hypertensive renal disease - Primary  ? Relevant Orders  ? CBC with Differential/Platelet  ? Comprehensive metabolic panel  ? Lipid panel  ?  ? Other  ? LUQ abdominal pain  ?  Trial on famotidine (pepcid) 20 mg once daily.  ?Recommend tylenol for side  pain. Likely musculoskeletal but will treat for possible gastritis.  ?Take a break from gardening to see if improves. ?  ?  ? ?I,Lauren M Auman,acting as a scribe for Rochel Brome, MD.,have documented all relevant documentation on

## 2021-06-04 NOTE — Patient Instructions (Signed)
Trial on famotidine (pepcid) 20 mg once daily.  ? ?Recommend tylenol for side pain.  ?REST!  ?

## 2021-06-04 NOTE — Assessment & Plan Note (Signed)
Trial on famotidine (pepcid) 20 mg once daily.  ?Recommend tylenol for side pain. Likely musculoskeletal but will treat for possible gastritis.  ?Take a break from gardening to see if improves. ?

## 2021-06-05 LAB — LIPID PANEL
Chol/HDL Ratio: 3 ratio (ref 0.0–4.4)
Cholesterol, Total: 220 mg/dL — ABNORMAL HIGH (ref 100–199)
HDL: 73 mg/dL (ref >39)
LDL Chol Calc (NIH): 132 mg/dL — ABNORMAL HIGH (ref 0–99)
Triglycerides: 85 mg/dL (ref 0–149)
VLDL Cholesterol Cal: 15 mg/dL (ref 5–40)

## 2021-06-05 LAB — CBC WITH DIFFERENTIAL/PLATELET
Basophils Absolute: 0 10*3/uL (ref 0.0–0.2)
Basos: 1 %
EOS (ABSOLUTE): 0.4 10*3/uL (ref 0.0–0.4)
Eos: 7 %
Hematocrit: 34.8 % (ref 34.0–46.6)
Hemoglobin: 11.6 g/dL (ref 11.1–15.9)
Immature Grans (Abs): 0 10*3/uL (ref 0.0–0.1)
Immature Granulocytes: 0 %
Lymphocytes Absolute: 1.7 10*3/uL (ref 0.7–3.1)
Lymphs: 32 %
MCH: 29.3 pg (ref 26.6–33.0)
MCHC: 33.3 g/dL (ref 31.5–35.7)
MCV: 88 fL (ref 79–97)
Monocytes Absolute: 0.5 10*3/uL (ref 0.1–0.9)
Monocytes: 9 %
Neutrophils Absolute: 2.7 10*3/uL (ref 1.4–7.0)
Neutrophils: 51 %
Platelets: 188 10*3/uL (ref 150–450)
RBC: 3.96 x10E6/uL (ref 3.77–5.28)
RDW: 12.5 % (ref 11.7–15.4)
WBC: 5.3 10*3/uL (ref 3.4–10.8)

## 2021-06-05 LAB — CARDIOVASCULAR RISK ASSESSMENT

## 2021-06-05 LAB — COMPREHENSIVE METABOLIC PANEL
ALT: 15 IU/L (ref 0–32)
AST: 24 IU/L (ref 0–40)
Albumin/Globulin Ratio: 1.9 (ref 1.2–2.2)
Albumin: 4.3 g/dL (ref 3.7–4.7)
Alkaline Phosphatase: 75 IU/L (ref 44–121)
BUN/Creatinine Ratio: 12 (ref 12–28)
BUN: 18 mg/dL (ref 8–27)
Bilirubin Total: 0.5 mg/dL (ref 0.0–1.2)
CO2: 24 mmol/L (ref 20–29)
Calcium: 9.5 mg/dL (ref 8.7–10.3)
Chloride: 104 mmol/L (ref 96–106)
Creatinine, Ser: 1.46 mg/dL — ABNORMAL HIGH (ref 0.57–1.00)
Globulin, Total: 2.3 g/dL (ref 1.5–4.5)
Glucose: 100 mg/dL — ABNORMAL HIGH (ref 70–99)
Potassium: 4.8 mmol/L (ref 3.5–5.2)
Sodium: 141 mmol/L (ref 134–144)
Total Protein: 6.6 g/dL (ref 6.0–8.5)
eGFR: 36 mL/min/{1.73_m2} — ABNORMAL LOW (ref >59)

## 2021-06-05 NOTE — Progress Notes (Signed)
Blood count normal.  ?Liver function normal.  ?Kidney function abnormal. But stable.  ?Cholesterol: Ldl up. Recommend low dose statin crestor 10 mg before bed.  ? ?

## 2021-06-24 ENCOUNTER — Ambulatory Visit (INDEPENDENT_AMBULATORY_CARE_PROVIDER_SITE_OTHER): Payer: Medicare HMO | Admitting: Family Medicine

## 2021-06-24 ENCOUNTER — Encounter: Payer: Self-pay | Admitting: Family Medicine

## 2021-06-24 VITALS — BP 148/78 | HR 64 | Temp 97.2°F | Ht 67.0 in | Wt 177.0 lb

## 2021-06-24 DIAGNOSIS — R001 Bradycardia, unspecified: Secondary | ICD-10-CM | POA: Diagnosis not present

## 2021-06-24 DIAGNOSIS — R1012 Left upper quadrant pain: Secondary | ICD-10-CM

## 2021-06-24 DIAGNOSIS — R1013 Epigastric pain: Secondary | ICD-10-CM

## 2021-06-24 DIAGNOSIS — R531 Weakness: Secondary | ICD-10-CM

## 2021-06-24 DIAGNOSIS — R0609 Other forms of dyspnea: Secondary | ICD-10-CM

## 2021-06-24 DIAGNOSIS — I499 Cardiac arrhythmia, unspecified: Secondary | ICD-10-CM | POA: Diagnosis not present

## 2021-06-24 NOTE — Progress Notes (Signed)
? ?Acute Office Visit ? ?Subjective:  ? ? Patient ID: Rebecca Orr, female    DOB: 1940-05-26, 81 y.o.   MRN: [id number] ? ?Chief Complaint  ?Patient presents with  ? LEFT SIDE PAIN  ?  On-going  ? ? ?HPI ?Patient is in today for follow up of LUQ abdominal pain. Hurts with certain ways she sits. Difficulty bending over. Food does not make it worse. Had some relief with flatus. Given pepcid 40 mg once daily which may have helped a little.  ? ?Patient was walking outside up hill in yard and became very weak. Occurred Monday.  ?Bp 147/69 and Pulse 70. Monitor showed irregular. No chest pain. No shortness of breath.  ?Has never happened previously.  ? ?Past Medical History:  ?Diagnosis Date  ? Acute pain of left knee 08/20/2019  ? Acute pain of right knee 08/20/2019  ? Anemia 07/08/2015  ? B12 deficiency 12/13/2019  ? Cancer [facility])   ? Colon cancer 2017   ? Cholecystitis 08/08/2015  ? Chronic renal impairment, stage 3b ([place]) 08/20/2019  ? Dermatitis 05/20/2020  ? Essential hypertension 03/16/2018  ? GERD (gastroesophageal reflux disease)   ? Heartburn 07/08/2015  ? Hematochezia 07/08/2015  ? Intermittent claudication ([place]) 08/13/2020  ? Iron deficiency anemia   ? Pain of lower extremity 05/20/2020  ? Palpitations 03/16/2018  ? Paresthesias 08/20/2019  ? Personal history of colon cancer, stage I 07/18/2015  ? Postoperative examination 09/01/2015  ? Renal stone   ? Transient ischemic attack   ? ? ?Past Surgical History:  ?Procedure Laterality Date  ? CATARACT EXTRACTION    ? CHOLECYSTECTOMY    ? HEMICOLECTOMY  2017  ? SPHINCTEROTOMY  2011  ? ? ?Family History  ?Problem Relation Age of Onset  ? Dementia Mother   ? Lung cancer Father   ? Heart disease Father   ? Renal cancer Sister   ? Prostate cancer Brother   ? Breast cancer Paternal Grandmother   ? ? ?Social History  ? ?Socioeconomic History  ? Marital status: Widowed  ?  Spouse name: Not on file  ? Number of children: Not on file  ? Years of education: Not on  file  ? Highest education level: Not on file  ?Occupational History  ? Not on file  ?Tobacco Use  ? Smoking status: Never  ? Smokeless tobacco: Never  ?Vaping Use  ? Vaping Use: Never used  ?Substance and Sexual Activity  ? Alcohol use: Never  ? Drug use: Never  ? Sexual activity: Not on file  ?Other Topics Concern  ? Not on file  ?Social History Narrative  ? Not on file  ? ?Social Determinants of Health  ? ?Financial Resource Strain: Low Risk   ? Difficulty of Paying Living Expenses: Not hard at all  ?Food Insecurity: No Food Insecurity  ? Worried About Charity fundraiser in the Last Year: Never true  ? Ran Out of Food in the Last Year: Never true  ?Transportation Needs: No Transportation Needs  ? Lack of Transportation (Medical): No  ? Lack of Transportation (Non-Medical): No  ?Physical Activity: Insufficiently Active  ? Days of Exercise per Week: 5 days  ? Minutes of Exercise per Session: 20 min  ?Stress: No Stress Concern Present  ? Feeling of Stress : Not at all  ?Social Connections: Moderately Integrated  ? Frequency of Communication with Friends and Family: More than three times a week  ? Frequency of Social Gatherings with Friends and  Family: Three times a week  ? Attends Religious Services: More than 4 times per year  ? Active Member of Clubs or Organizations: Yes  ? Attends Archivist Meetings: 1 to 4 times per year  ? Marital Status: Widowed  ?Intimate Partner Violence: Not At Risk  ? Fear of Current or Ex-Partner: No  ? Emotionally Abused: No  ? Physically Abused: No  ? Sexually Abused: No  ? ? ?Outpatient Medications Prior to Visit  ?Medication Sig Dispense Refill  ? famotidine (PEPCID) 40 MG tablet Take 40 mg by mouth 2 (two) times daily.    ? pregabalin (LYRICA) 25 MG capsule Take 1 capsule (25 mg total) by mouth 3 (three) times daily. 270 capsule 3  ? ?No facility-administered medications prior to visit.  ? ? ?Allergies  ?Allergen Reactions  ? Nsaids Other (See Comments)  ?  GI Bleed ?GI  Bleed  ? Aspirin Other (See Comments)  ? Atenolol Other (See Comments)  ? Naproxen Other (See Comments)  ? Propranolol Other (See Comments)  ?  Unknown  ? Tizanidine Other (See Comments)  ?  Dizziness ?Dizziness  ? ? ?Review of Systems  ?Constitutional:  Negative for appetite change, fatigue and fever.  ?HENT:  Negative for congestion, ear pain, sinus pressure and sore throat.   ?Respiratory:  Negative for cough, chest tightness, shortness of breath and wheezing.   ?Cardiovascular:  Negative for chest pain and palpitations.  ?Gastrointestinal:  Negative for abdominal pain, constipation, diarrhea, nausea and vomiting.  ?Genitourinary:  Negative for dysuria and hematuria.  ?Musculoskeletal:  Positive for myalgias (Left side pain). Negative for arthralgias, back pain and joint swelling.  ?Skin:  Negative for rash.  ?Neurological:  Negative for dizziness, weakness and headaches.  ?Psychiatric/Behavioral:  Negative for dysphoric mood. The patient is not nervous/anxious.   ? ?   ?Objective:  ?  ?Physical Exam ?Vitals reviewed.  ?Constitutional:   ?   Appearance: Normal appearance. She is normal weight.  ?Cardiovascular:  ?   Rate and Rhythm: Normal rate and regular rhythm.  ?   Pulses: Normal pulses.  ?   Heart sounds: Normal heart sounds.  ?Pulmonary:  ?   Effort: Pulmonary effort is normal.  ?   Breath sounds: Normal breath sounds.  ?Abdominal:  ?   General: Abdomen is flat. Bowel sounds are normal.  ?   Palpations: Abdomen is soft.  ?Neurological:  ?   Mental Status: She is alert and oriented to person, place, and time.  ?Psychiatric:     ?   Mood and Affect: Mood normal.     ?   Behavior: Behavior normal.  ? ? ?BP (!) 148/78 (BP Location: Left Arm, Patient Position: Sitting)   Pulse 64   Temp (!) 97.2 ?F (36.2 ?C) (Temporal)   Ht '5\' 7"'  (1.702 m)   Wt 177 lb (80.3 kg)   SpO2 99%   BMI 27.72 kg/m?  ?Wt Readings from Last 3 Encounters:  ?06/24/21 177 lb (80.3 kg)  ?06/04/21 176 lb (79.8 kg)  ?06/01/21 179 lb 12 oz  (81.5 kg)  ? ? ?There are no preventive care reminders to display for this patient. ? ?There are no preventive care reminders to display for this patient. ? ? ?Lab Results  ?Component Value Date  ? TSH 2.400 06/24/2021  ? ?Lab Results  ?Component Value Date  ? WBC 4.7 06/24/2021  ? HGB 12.1 06/24/2021  ? HCT 36.2 06/24/2021  ? MCV 88 06/24/2021  ? PLT  188 06/24/2021  ? ?Lab Results  ?Component Value Date  ? NA 143 06/24/2021  ? K 5.1 06/24/2021  ? CO2 24 06/24/2021  ? GLUCOSE 102 (H) 06/24/2021  ? BUN 19 06/24/2021  ? CREATININE 1.51 (H) 06/24/2021  ? BILITOT 0.4 06/24/2021  ? ALKPHOS 79 06/24/2021  ? AST 23 06/24/2021  ? ALT 18 06/24/2021  ? PROT 6.9 06/24/2021  ? ALBUMIN 4.5 06/24/2021  ? CALCIUM 9.8 06/24/2021  ? ANIONGAP 7 01/14/2020  ? EGFR 35 (L) 06/24/2021  ? ?Lab Results  ?Component Value Date  ? CHOL 220 (H) 06/04/2021  ? ?Lab Results  ?Component Value Date  ? HDL 73 06/04/2021  ? ?Lab Results  ?Component Value Date  ? LDLCALC 132 (H) 06/04/2021  ? ?Lab Results  ?Component Value Date  ? TRIG 85 06/04/2021  ? ?Lab Results  ?Component Value Date  ? CHOLHDL 3.0 06/04/2021  ? ?Lab Results  ?Component Value Date  ? HGBA1C 5.6 07/30/2020  ? ? ? ? ?   ?Assessment & Plan:  ? ?Problem List Items Addressed This Visit   ? ?  ? Other  ? LUQ abdominal pain  ?  Unclear etiology  ? ?  ?  ? Epigastric abdominal pain - Primary  ?  Increase pepcid 40 mg twice daily.  ? ?  ?  ? Relevant Orders  ? H. pylori breath test (Completed)  ? Irregular heart beats  ?  Check ekg. holtor monitor.  ? ?  ?  ? Relevant Orders  ? EKG 12-Lead  ? LONG TERM MONITOR (3-14 DAYS)  ? Bradycardia  ?  Likely secondary to bradycardia, which is not explained by side effects of drugs (BB or CCB.) ? ? ?  ?  ? Relevant Orders  ? CBC with Differential/Platelet (Completed)  ? Comprehensive metabolic panel (Completed)  ? TSH (Completed)  ? LONG TERM MONITOR (3-14 DAYS)  ? Dyspnea on exertion  ? Relevant Orders  ? LONG TERM MONITOR (3-14 DAYS)  ? Weakness  ?   Likely secondary to bradycardia, which is not explained by side effects of drugs (BB or CCB.) ? ?  ?  ? Relevant Orders  ? LONG TERM MONITOR (3-14 DAYS)  ? ?I,Lauren M Auman,acting as a Education administrator for [employer]

## 2021-06-25 LAB — CBC WITH DIFFERENTIAL/PLATELET
Basophils Absolute: 0.1 10*3/uL (ref 0.0–0.2)
Basos: 1 %
EOS (ABSOLUTE): 0.4 10*3/uL (ref 0.0–0.4)
Eos: 8 %
Hematocrit: 36.2 % (ref 34.0–46.6)
Hemoglobin: 12.1 g/dL (ref 11.1–15.9)
Immature Grans (Abs): 0 10*3/uL (ref 0.0–0.1)
Immature Granulocytes: 0 %
Lymphocytes Absolute: 1.3 10*3/uL (ref 0.7–3.1)
Lymphs: 28 %
MCH: 29.5 pg (ref 26.6–33.0)
MCHC: 33.4 g/dL (ref 31.5–35.7)
MCV: 88 fL (ref 79–97)
Monocytes Absolute: 0.5 10*3/uL (ref 0.1–0.9)
Monocytes: 10 %
Neutrophils Absolute: 2.5 10*3/uL (ref 1.4–7.0)
Neutrophils: 53 %
Platelets: 188 10*3/uL (ref 150–450)
RBC: 4.1 x10E6/uL (ref 3.77–5.28)
RDW: 12.7 % (ref 11.7–15.4)
WBC: 4.7 10*3/uL (ref 3.4–10.8)

## 2021-06-25 LAB — COMPREHENSIVE METABOLIC PANEL
ALT: 18 IU/L (ref 0–32)
AST: 23 IU/L (ref 0–40)
Albumin/Globulin Ratio: 1.9 (ref 1.2–2.2)
Albumin: 4.5 g/dL (ref 3.7–4.7)
Alkaline Phosphatase: 79 IU/L (ref 44–121)
BUN/Creatinine Ratio: 13 (ref 12–28)
BUN: 19 mg/dL (ref 8–27)
Bilirubin Total: 0.4 mg/dL (ref 0.0–1.2)
CO2: 24 mmol/L (ref 20–29)
Calcium: 9.8 mg/dL (ref 8.7–10.3)
Chloride: 106 mmol/L (ref 96–106)
Creatinine, Ser: 1.51 mg/dL — ABNORMAL HIGH (ref 0.57–1.00)
Globulin, Total: 2.4 g/dL (ref 1.5–4.5)
Glucose: 102 mg/dL — ABNORMAL HIGH (ref 70–99)
Potassium: 5.1 mmol/L (ref 3.5–5.2)
Sodium: 143 mmol/L (ref 134–144)
Total Protein: 6.9 g/dL (ref 6.0–8.5)
eGFR: 35 mL/min/{1.73_m2} — ABNORMAL LOW (ref >59)

## 2021-06-25 LAB — H. PYLORI BREATH TEST: H pylori Breath Test: NEGATIVE

## 2021-06-25 LAB — TSH: TSH: 2.4 u[IU]/mL (ref 0.450–4.500)

## 2021-06-26 ENCOUNTER — Ambulatory Visit (INDEPENDENT_AMBULATORY_CARE_PROVIDER_SITE_OTHER): Payer: Medicare HMO

## 2021-06-26 DIAGNOSIS — R001 Bradycardia, unspecified: Secondary | ICD-10-CM

## 2021-06-26 DIAGNOSIS — I499 Cardiac arrhythmia, unspecified: Secondary | ICD-10-CM

## 2021-06-26 DIAGNOSIS — R0609 Other forms of dyspnea: Secondary | ICD-10-CM

## 2021-06-26 DIAGNOSIS — R531 Weakness: Secondary | ICD-10-CM

## 2021-06-26 NOTE — Progress Notes (Signed)
Blood count normal.  ?Liver function normal.  ?Kidney function abnormal. But stable ?Thyroid function normal.  ?H pylori negative.  ?Continue pepcid 40 mg twice daily dosing.  ?Dr Tobie Poet

## 2021-06-26 NOTE — Progress Notes (Unsigned)
Enrolled patient for a 14 day Zio XT  monitor to be mailed to patients home  °

## 2021-06-28 DIAGNOSIS — R531 Weakness: Secondary | ICD-10-CM | POA: Insufficient documentation

## 2021-06-28 DIAGNOSIS — I499 Cardiac arrhythmia, unspecified: Secondary | ICD-10-CM

## 2021-06-28 DIAGNOSIS — R1013 Epigastric pain: Secondary | ICD-10-CM | POA: Insufficient documentation

## 2021-06-28 DIAGNOSIS — R0609 Other forms of dyspnea: Secondary | ICD-10-CM

## 2021-06-28 DIAGNOSIS — R001 Bradycardia, unspecified: Secondary | ICD-10-CM

## 2021-06-28 HISTORY — DX: Epigastric pain: R10.13

## 2021-06-28 HISTORY — DX: Cardiac arrhythmia, unspecified: I49.9

## 2021-06-28 HISTORY — DX: Other forms of dyspnea: R06.09

## 2021-06-28 HISTORY — DX: Bradycardia, unspecified: R00.1

## 2021-06-28 HISTORY — DX: Weakness: R53.1

## 2021-06-28 NOTE — Assessment & Plan Note (Signed)
Unclear etiology

## 2021-06-28 NOTE — Assessment & Plan Note (Signed)
Check ekg. holtor monitor.  ?

## 2021-06-28 NOTE — Assessment & Plan Note (Signed)
Increase pepcid 40 mg twice daily.  ?

## 2021-06-28 NOTE — Assessment & Plan Note (Signed)
Likely secondary to bradycardia, which is not explained by side effects of drugs (BB or CCB.) ? ?

## 2021-06-28 NOTE — Assessment & Plan Note (Signed)
Likely secondary to bradycardia, which is not explained by side effects of drugs (BB or CCB.) ?

## 2021-06-29 ENCOUNTER — Telehealth: Payer: Self-pay

## 2021-06-29 ENCOUNTER — Inpatient Hospital Stay: Payer: Medicare HMO | Attending: Oncology

## 2021-06-29 VITALS — BP 138/71 | HR 66 | Temp 98.1°F | Resp 20 | Ht 67.0 in | Wt 178.8 lb

## 2021-06-29 DIAGNOSIS — E538 Deficiency of other specified B group vitamins: Secondary | ICD-10-CM | POA: Insufficient documentation

## 2021-06-29 MED ORDER — CYANOCOBALAMIN 1000 MCG/ML IJ SOLN
1000.0000 ug | Freq: Once | INTRAMUSCULAR | Status: AC
  Administered 2021-06-29: 1000 ug via INTRAMUSCULAR
  Filled 2021-06-29: qty 1

## 2021-06-29 NOTE — Patient Instructions (Signed)
Vitamin B12 Injection ?What is this medication? ?Vitamin B12 (VAHY tuh min B12) prevents and treats low vitamin B12 levels in your body. It is used in people who do not get enough vitamin B12 from their diet or when their digestive tract does not absorb enough. Vitamin B12 plays an important role in maintaining the health of your nervous system and red blood cells. ?This medicine may be used for other purposes; ask your health care provider or pharmacist if you have questions. ?COMMON BRAND NAME(S): B-12 Compliance Kit, B-12 Injection Kit, Cyomin, Dodex, LA-12, Nutri-Twelve, Physicians EZ Use B-12, Primabalt ?What should I tell my care team before I take this medication? ?They need to know if you have any of these conditions: ?Kidney disease ?Leber's disease ?Megaloblastic anemia ?An unusual or allergic reaction to cyanocobalamin, cobalt, other medications, foods, dyes, or preservatives ?Pregnant or trying to get pregnant ?Breast-feeding ?How should I use this medication? ?This medication is injected into a muscle or deeply under the skin. It is usually given in a clinic or care team's office. However, your care team may teach you how to inject yourself. Follow all instructions. ?Talk to your care team about the use of this medication in children. Special care may be needed. ?Overdosage: If you think you have taken too much of this medicine contact a poison control center or emergency room at once. ?NOTE: This medicine is only for you. Do not share this medicine with others. ?What if I miss a dose? ?If you are given your dose at a clinic or care team's office, call to reschedule your appointment. If you give your own injections, and you miss a dose, take it as soon as you can. If it is almost time for your next dose, take only that dose. Do not take double or extra doses. ?What may interact with this medication? ?Alcohol ?Colchicine ?This list may not describe all possible interactions. Give your health care  provider a list of all the medicines, herbs, non-prescription drugs, or dietary supplements you use. Also tell them if you smoke, drink alcohol, or use illegal drugs. Some items may interact with your medicine. ?What should I watch for while using this medication? ?Visit your care team regularly. You may need blood work done while you are taking this medication. ?You may need to follow a special diet. Talk to your care team. Limit your alcohol intake and avoid smoking to get the best benefit. ?What side effects may I notice from receiving this medication? ?Side effects that you should report to your care team as soon as possible: ?Allergic reactions--skin rash, itching, hives, swelling of the face, lips, tongue, or throat ?Swelling of the ankles, hands, or feet ?Trouble breathing ?Side effects that usually do not require medical attention (report to your care team if they continue or are bothersome): ?Diarrhea ?This list may not describe all possible side effects. Call your doctor for medical advice about side effects. You may report side effects to FDA at 1-800-FDA-1088. ?Where should I keep my medication? ?Keep out of the reach of children. ?Store at room temperature between 15 and 30 degrees C (59 and 85 degrees F). Protect from light. Throw away any unused medication after the expiration date. ?NOTE: This sheet is a summary. It may not cover all possible information. If you have questions about this medicine, talk to your doctor, pharmacist, or health care provider. ?? 2023 Elsevier/Gold Standard (2020-10-28 00:00:00) ? ?

## 2021-06-29 NOTE — Telephone Encounter (Signed)
Patient called and stated that she is concerned about the dosing of the pepcid being '40mg'$  twice daily. Patient stated that her pharmacist told her that she should not be taking that much, and stated that her GI specialist originally placed her on that medication and then stated that it was only once daily because she was on an aspirin daily. Patient is overly concerned and states she does not know who to believe. Please advise. ?

## 2021-06-30 DIAGNOSIS — R0609 Other forms of dyspnea: Secondary | ICD-10-CM

## 2021-06-30 DIAGNOSIS — I499 Cardiac arrhythmia, unspecified: Secondary | ICD-10-CM | POA: Diagnosis not present

## 2021-06-30 DIAGNOSIS — R001 Bradycardia, unspecified: Secondary | ICD-10-CM | POA: Diagnosis not present

## 2021-06-30 DIAGNOSIS — R531 Weakness: Secondary | ICD-10-CM | POA: Diagnosis not present

## 2021-07-01 NOTE — Telephone Encounter (Signed)
Patient informed, patient stated that she has felt better since taking the Pepcid 40 mg once daily. She stated that she understood and she will still with the pepcid 40 mg once daily. ?

## 2021-07-09 ENCOUNTER — Other Ambulatory Visit: Payer: Self-pay

## 2021-07-20 DIAGNOSIS — R0609 Other forms of dyspnea: Secondary | ICD-10-CM | POA: Diagnosis not present

## 2021-07-20 DIAGNOSIS — I499 Cardiac arrhythmia, unspecified: Secondary | ICD-10-CM | POA: Diagnosis not present

## 2021-07-20 DIAGNOSIS — R001 Bradycardia, unspecified: Secondary | ICD-10-CM | POA: Diagnosis not present

## 2021-07-20 DIAGNOSIS — R531 Weakness: Secondary | ICD-10-CM | POA: Diagnosis not present

## 2021-07-24 ENCOUNTER — Other Ambulatory Visit: Payer: Self-pay | Admitting: Pharmacist

## 2021-07-28 ENCOUNTER — Inpatient Hospital Stay: Payer: Medicare HMO

## 2021-07-28 VITALS — BP 156/60 | HR 71 | Temp 97.9°F | Resp 20 | Ht 67.0 in | Wt 178.8 lb

## 2021-07-28 DIAGNOSIS — E538 Deficiency of other specified B group vitamins: Secondary | ICD-10-CM | POA: Diagnosis not present

## 2021-07-28 MED ORDER — CYANOCOBALAMIN 1000 MCG/ML IJ SOLN
1000.0000 ug | Freq: Once | INTRAMUSCULAR | Status: AC
  Administered 2021-07-28: 1000 ug via INTRAMUSCULAR
  Filled 2021-07-28: qty 1

## 2021-07-28 NOTE — Patient Instructions (Signed)
Vitamin B12 Injection What is this medication? Vitamin B12 (VAHY tuh min B12) prevents and treats low vitamin B12 levels in your body. It is used in people who do not get enough vitamin B12 from their diet or when their digestive tract does not absorb enough. Vitamin B12 plays an important role in maintaining the health of your nervous system and red blood cells. This medicine may be used for other purposes; ask your health care provider or pharmacist if you have questions. COMMON BRAND NAME(S): B-12 Compliance Kit, B-12 Injection Kit, Cyomin, Dodex, LA-12, Nutri-Twelve, Physicians EZ Use B-12, Primabalt What should I tell my care team before I take this medication? They need to know if you have any of these conditions: Kidney disease Leber's disease Megaloblastic anemia An unusual or allergic reaction to cyanocobalamin, cobalt, other medications, foods, dyes, or preservatives Pregnant or trying to get pregnant Breast-feeding How should I use this medication? This medication is injected into a muscle or deeply under the skin. It is usually given in a clinic or care team's office. However, your care team may teach you how to inject yourself. Follow all instructions. Talk to your care team about the use of this medication in children. Special care may be needed. Overdosage: If you think you have taken too much of this medicine contact a poison control center or emergency room at once. NOTE: This medicine is only for you. Do not share this medicine with others. What if I miss a dose? If you are given your dose at a clinic or care team's office, call to reschedule your appointment. If you give your own injections, and you miss a dose, take it as soon as you can. If it is almost time for your next dose, take only that dose. Do not take double or extra doses. What may interact with this medication? Alcohol Colchicine This list may not describe all possible interactions. Give your health care  provider a list of all the medicines, herbs, non-prescription drugs, or dietary supplements you use. Also tell them if you smoke, drink alcohol, or use illegal drugs. Some items may interact with your medicine. What should I watch for while using this medication? Visit your care team regularly. You may need blood work done while you are taking this medication. You may need to follow a special diet. Talk to your care team. Limit your alcohol intake and avoid smoking to get the best benefit. What side effects may I notice from receiving this medication? Side effects that you should report to your care team as soon as possible: Allergic reactions--skin rash, itching, hives, swelling of the face, lips, tongue, or throat Swelling of the ankles, hands, or feet Trouble breathing Side effects that usually do not require medical attention (report to your care team if they continue or are bothersome): Diarrhea This list may not describe all possible side effects. Call your doctor for medical advice about side effects. You may report side effects to FDA at 1-800-FDA-1088. Where should I keep my medication? Keep out of the reach of children. Store at room temperature between 15 and 30 degrees C (59 and 85 degrees F). Protect from light. Throw away any unused medication after the expiration date. NOTE: This sheet is a summary. It may not cover all possible information. If you have questions about this medicine, talk to your doctor, pharmacist, or health care provider.  2023 Elsevier/Gold Standard (2020-10-28 00:00:00)

## 2021-08-07 ENCOUNTER — Encounter: Payer: Self-pay | Admitting: Cardiology

## 2021-08-07 ENCOUNTER — Ambulatory Visit: Payer: Medicare HMO | Admitting: Cardiology

## 2021-08-07 VITALS — BP 164/78 | HR 64 | Ht 67.0 in | Wt 181.0 lb

## 2021-08-07 DIAGNOSIS — I4729 Other ventricular tachycardia: Secondary | ICD-10-CM

## 2021-08-07 DIAGNOSIS — E782 Mixed hyperlipidemia: Secondary | ICD-10-CM | POA: Diagnosis not present

## 2021-08-07 DIAGNOSIS — I10 Essential (primary) hypertension: Secondary | ICD-10-CM

## 2021-08-07 DIAGNOSIS — R072 Precordial pain: Secondary | ICD-10-CM | POA: Diagnosis not present

## 2021-08-07 HISTORY — DX: Other ventricular tachycardia: I47.29

## 2021-08-07 MED ORDER — METOPROLOL SUCCINATE ER 25 MG PO TB24: 12.5000 mg | ORAL_TABLET | Freq: Every day | ORAL | 3 refills | Status: DC

## 2021-08-07 NOTE — Progress Notes (Signed)
Cardiology Office Note:    Date:  08/07/2021   ID:  Rebecca Orr, DOB 01-02-1941, MRN [id number]  PCP:  Rochel Brome, MD  Cardiologist:  Jenean Lindau, MD   Referring MD: Rochel Brome, MD    ASSESSMENT:    1. Essential hypertension   2. Mixed hyperlipidemia   3. NSVT (nonsustained ventricular tachycardia) (HCC)    PLAN:    In order of problems listed above:  Abdominal atherosclerosis, secondary prevention stressed with the patient.  Importance of compliance with diet medication stressed and she vocalized understanding.  She is not keen on any lipid-lowering medications because of fear of side effects.  I respect her wishes. Elevated blood pressure without diagnosis of hypertension: She is tells me that her blood pressures are better at home.  In view of nonsustained ventricular tachycardia and some tachyarrhythmias I will initiate her on Toprol-XL 12.5 mg daily.  She will keep a track of pulse and blood pressure and get it back to [place]. We will do a Lexiscan sestamibi to assess nonsustained ventricular tachycardia and some dyspnea on exertion issues. Mixed dyslipidemia: Again lipids followed by primary care and she is not keen on lipid-lowering medications.  Risks explained and she vocalized understanding and questions were answered to her satisfaction. Patient will be seen in follow-up appointment in 6 months or earlier if the patient has any concerns    Medication Adjustments/Labs and Tests Ordered: Current medicines are reviewed at length with the patient today.  Concerns regarding medicines are outlined above.  No orders of the defined types were placed in this encounter.  No orders of the defined types were placed in this encounter.    No chief complaint on file.    History of Present Illness:    Rebecca Orr is a 81 y.o. female.  Patient has past medical history of nonsustained ventricular tachycardia by recent monitoring.,  Atrial  runs, renal insufficiency and history of abdominal aortic atherosclerosis.  She denies any problems at this time and takes care of activities of daily living.  No chest pain orthopnea or PND.  She leads a fairly sedentary lifestyle because of knee issues.  At the time of my evaluation, the patient is alert awake oriented and in no distress.  Past Medical History:  Diagnosis Date   Acute pain of left knee 08/20/2019   Acute pain of right knee 08/20/2019   Anemia 07/08/2015   B12 deficiency 12/13/2019   Cancer ([place])    Colon cancer 2017    Cholecystitis 08/08/2015   Chronic renal impairment, stage 3b ([place]) 08/20/2019   Dermatitis 05/20/2020   Essential hypertension 03/16/2018   GERD (gastroesophageal reflux disease)    Heartburn 07/08/2015   Hematochezia 07/08/2015   Intermittent claudication ([place]) 08/13/2020   Iron deficiency anemia    Pain of lower extremity 05/20/2020   Palpitations 03/16/2018   Paresthesias 08/20/2019   Personal history of colon cancer, stage I 07/18/2015   Postoperative examination 09/01/2015   Renal stone    Transient ischemic attack     Past Surgical History:  Procedure Laterality Date   CATARACT EXTRACTION     CHOLECYSTECTOMY     HEMICOLECTOMY  2017   SPHINCTEROTOMY  2011    Current Medications: Current Meds  Medication Sig   Cyanocobalamin (B-12 IJ) Inject as directed every 30 (thirty) days.   famotidine (PEPCID) 40 MG tablet Take 40 mg by mouth daily.   pregabalin (LYRICA) 25 MG capsule Take 1 capsule (25  mg total) by mouth 3 (three) times daily.     Allergies:   Nsaids, Aspirin, Atenolol, Naproxen, Propranolol, and Tizanidine   Social History   Socioeconomic History   Marital status: Widowed    Spouse name: Not on file   Number of children: Not on file   Years of education: Not on file   Highest education level: Not on file  Occupational History   Not on file  Tobacco Use   Smoking status: Never   Smokeless tobacco: Never  Vaping Use    Vaping Use: Never used  Substance and Sexual Activity   Alcohol use: Never   Drug use: Never   Sexual activity: Not on file  Other Topics Concern   Not on file  Social History Narrative   Not on file   Social Determinants of Health   Financial Resource Strain: Low Risk  (02/10/2021)   Overall Financial Resource Strain (CARDIA)    Difficulty of Paying Living Expenses: Not hard at all  Food Insecurity: No Food Insecurity (02/10/2021)   Hunger Vital Sign    Worried About Running Out of Food in the Last Year: Never true    [place] in the Last Year: Never true  Transportation Needs: No Transportation Needs (02/10/2021)   PRAPARE - Hydrologist (Medical): No    Lack of Transportation (Non-Medical): No  Physical Activity: Insufficiently Active (02/10/2021)   Exercise Vital Sign    Days of Exercise per Week: 5 days    Minutes of Exercise per Session: 20 min  Stress: No Stress Concern Present (02/10/2021)   [place]    Feeling of Stress : Not at all  Social Connections: Moderately Integrated (02/10/2021)   Social Connection and Isolation Panel [NHANES]    Frequency of Communication with Friends and Family: More than three times a week    Frequency of Social Gatherings with Friends and Family: Three times a week    Attends Religious Services: More than 4 times per year    Active Member of Clubs or Organizations: Yes    Attends Archivist Meetings: 1 to 4 times per year    Marital Status: Widowed     Family History: The patient's family history includes Breast cancer in her paternal grandmother; Dementia in her mother; Heart disease in her father; Lung cancer in her father; Prostate cancer in her brother; Renal cancer in her sister.  ROS:   Please see the history of present illness.    All other systems reviewed and are negative.  EKGs/Labs/Other Studies Reviewed:     The following studies were reviewed today: I discussed my findings with the patient at length including event monitor report.   Recent Labs: 06/24/2021: ALT 18; BUN 19; Creatinine, Ser 1.51; Hemoglobin 12.1; Platelets 188; Potassium 5.1; Sodium 143; TSH 2.400  Recent Lipid Panel    Component Value Date/Time   CHOL 220 (H) 06/04/2021 1054   TRIG 85 06/04/2021 1054   HDL 73 06/04/2021 1054   CHOLHDL 3.0 06/04/2021 1054   LDLCALC 132 (H) 06/04/2021 1054    Physical Exam:    VS:  BP (!) 164/78 (BP Location: Right Arm, Patient Position: Sitting)   Pulse 64   Ht '5\' 7"'$  (1.702 m)   Wt 181 lb (82.1 kg)   SpO2 98%   BMI 28.35 kg/m     Wt Readings from Last 3 Encounters:  08/07/21 181 lb (  82.1 kg)  07/28/21 178 lb 12 oz (81.1 kg)  06/29/21 178 lb 12 oz (81.1 kg)     GEN: Patient is in no acute distress HEENT: Normal NECK: No JVD; No carotid bruits LYMPHATICS: No lymphadenopathy CARDIAC: Hear sounds regular, 2/6 systolic murmur at the apex. RESPIRATORY:  Clear to auscultation without rales, wheezing or rhonchi  ABDOMEN: Soft, non-tender, non-distended MUSCULOSKELETAL:  No edema; No deformity  SKIN: Warm and dry NEUROLOGIC:  Alert and oriented x 3 PSYCHIATRIC:  Normal affect   Signed, Jenean Lindau, MD  08/07/2021 11:45 AM    [place]

## 2021-08-07 NOTE — Patient Instructions (Addendum)
Medication Instructions:  Your physician recommends that you continue on your current medications as directed. Please refer to the Current Medication list given to you today.  *If you need a refill on your cardiac medications before your next appointment, please call your pharmacy*   Lab Work: None Ordered If you have labs (blood work) drawn today and your tests are completely normal, you will receive your results only by: [place] (if you have MyChart) OR A paper copy in the mail If you have any lab test that is abnormal or we need to change your treatment, we will call you to review the results.   Testing/Procedures: Your physician has requested that you have a lexiscan myoview. For further information please visit [email or web address]. Please follow instruction sheet, as given.  The test will take approximately 3 to 4 hours to complete; you may bring reading material.  If someone comes with you to your appointment, they will need to remain in the main lobby due to limited space in the testing area.  How to prepare for your Myocardial Perfusion Test: Do not eat or drink 3 hours prior to your test, except you may have water. Do not consume products containing caffeine (regular or decaffeinated) 12 hours prior to your test. (ex: coffee, chocolate, sodas, tea). Do bring a list of your current medications with you.  If not listed below, you may take your medications as normal. Do wear comfortable clothes (no dresses or overalls) and walking shoes, tennis shoes preferred (No heels or open toe shoes are allowed). Do NOT wear cologne, perfume, aftershave, or lotions (deodorant is allowed). If these instructions are not followed, your test will have to be rescheduled.     Follow-Up: At [facility], you and your health needs are our priority.  As part of our continuing mission to provide you with exceptional heart care, we have created designated Provider Care Teams.  These Care Teams  include your primary Cardiologist (physician) and Advanced Practice Providers (APPs -  Physician Assistants and Nurse Practitioners) who all work together to provide you with the care you need, when you need it.  We recommend signing up for the patient portal called "MyChart".  Sign up information is provided on this After Visit Summary.  MyChart is used to connect with patients for Virtual Visits (Telemedicine).  Patients are able to view lab/test results, encounter notes, upcoming appointments, etc.  Non-urgent messages can be sent to your provider as well.   To learn more about what you can do with MyChart, go to [email or web address].    Your next appointment:   6 month(s)  The format for your next appointment:   In Person  Provider:   Jyl Heinz, MD    Other Instructions NA

## 2021-08-08 LAB — BASIC METABOLIC PANEL
BUN/Creatinine Ratio: 12 (ref 12–28)
BUN: 17 mg/dL (ref 8–27)
CO2: 22 mmol/L (ref 20–29)
Calcium: 9.5 mg/dL (ref 8.7–10.3)
Chloride: 108 mmol/L — ABNORMAL HIGH (ref 96–106)
Creatinine, Ser: 1.41 mg/dL — ABNORMAL HIGH (ref 0.57–1.00)
Glucose: 107 mg/dL — ABNORMAL HIGH (ref 70–99)
Potassium: 4.8 mmol/L (ref 3.5–5.2)
Sodium: 146 mmol/L — ABNORMAL HIGH (ref 134–144)
eGFR: 38 mL/min/{1.73_m2} — ABNORMAL LOW (ref >59)

## 2021-08-11 ENCOUNTER — Telehealth (HOSPITAL_COMMUNITY): Payer: Self-pay | Admitting: *Deleted

## 2021-08-11 NOTE — Telephone Encounter (Signed)
Left message on voicemail per DPR in reference to upcoming appointment scheduled on  08/18/21 with detailed instructions given per Myocardial Perfusion Study Information Sheet for the test. LM to arrive 15 minutes early, and that it is imperative to arrive on time for appointment to keep from having the test rescheduled. If you need to cancel or reschedule your appointment, please call the office within 24 hours of your appointment. Failure to do so may result in a cancellation of your appointment, and a $50 no show fee. Phone number given for call back for any questions. Kirstie Peri

## 2021-08-18 ENCOUNTER — Ambulatory Visit (INDEPENDENT_AMBULATORY_CARE_PROVIDER_SITE_OTHER): Payer: Medicare HMO

## 2021-08-18 DIAGNOSIS — R072 Precordial pain: Secondary | ICD-10-CM | POA: Diagnosis not present

## 2021-08-18 LAB — MYOCARDIAL PERFUSION IMAGING
LV dias vol: 64 mL (ref 46–106)
LV sys vol: 19 mL
Nuc Stress EF: 70 %
Peak HR: 83 {beats}/min
Rest HR: 64 {beats}/min
Rest Nuclear Isotope Dose: 10.7 mCi
SDS: 0
SRS: 0
SSS: 0
ST Depression (mm): 0 mm
Stress Nuclear Isotope Dose: 31.7 mCi
TID: 1

## 2021-08-18 MED ORDER — TECHNETIUM TC 99M TETROFOSMIN IV KIT
10.7000 | PACK | Freq: Once | INTRAVENOUS | Status: AC | PRN
  Administered 2021-08-18: 10.7 via INTRAVENOUS

## 2021-08-18 MED ORDER — REGADENOSON 0.4 MG/5ML IV SOLN
0.4000 mg | Freq: Once | INTRAVENOUS | Status: AC
Start: 2021-08-18 — End: 2021-08-18
  Administered 2021-08-18: 0.4 mg via INTRAVENOUS

## 2021-08-18 MED ORDER — TECHNETIUM TC 99M TETROFOSMIN IV KIT
31.7000 | PACK | Freq: Once | INTRAVENOUS | Status: AC | PRN
Start: 2021-08-18 — End: 2021-08-18
  Administered 2021-08-18: 31.7 via INTRAVENOUS

## 2021-08-19 ENCOUNTER — Telehealth: Payer: Self-pay

## 2021-08-19 NOTE — Telephone Encounter (Signed)
-----   Message from Jenean Lindau, MD sent at 08/19/2021  8:02 AM EDT ----- The results of the study is unremarkable. Please inform patient. I will discuss in detail at next appointment. Cc  primary care/referring physician Jenean Lindau, MD 08/19/2021 8:02 AM

## 2021-08-19 NOTE — Telephone Encounter (Signed)
Patient notified of results, results sent to PCP

## 2021-08-21 ENCOUNTER — Encounter: Payer: Self-pay | Admitting: Hematology and Oncology

## 2021-08-24 ENCOUNTER — Inpatient Hospital Stay: Payer: Medicare HMO | Attending: Oncology

## 2021-08-24 VITALS — BP 135/61 | HR 64 | Temp 98.6°F | Resp 20

## 2021-08-24 DIAGNOSIS — E538 Deficiency of other specified B group vitamins: Secondary | ICD-10-CM | POA: Diagnosis not present

## 2021-08-24 MED ORDER — CYANOCOBALAMIN 1000 MCG/ML IJ SOLN
1000.0000 ug | Freq: Once | INTRAMUSCULAR | Status: AC
  Administered 2021-08-24: 1000 ug via INTRAMUSCULAR
  Filled 2021-08-24: qty 1

## 2021-08-31 ENCOUNTER — Ambulatory Visit: Payer: Medicare HMO | Admitting: Cardiology

## 2021-09-10 ENCOUNTER — Other Ambulatory Visit: Payer: Self-pay | Admitting: Cardiology

## 2021-09-10 DIAGNOSIS — M79604 Pain in right leg: Secondary | ICD-10-CM

## 2021-09-21 ENCOUNTER — Inpatient Hospital Stay: Payer: Medicare HMO | Attending: Oncology

## 2021-09-21 VITALS — BP 137/55 | HR 66 | Temp 98.2°F | Resp 18 | Ht 67.0 in | Wt 180.0 lb

## 2021-09-21 DIAGNOSIS — E538 Deficiency of other specified B group vitamins: Secondary | ICD-10-CM | POA: Diagnosis not present

## 2021-09-21 MED ORDER — CYANOCOBALAMIN 1000 MCG/ML IJ SOLN
1000.0000 ug | Freq: Once | INTRAMUSCULAR | Status: AC
  Administered 2021-09-21: 1000 ug via INTRAMUSCULAR
  Filled 2021-09-21: qty 1

## 2021-09-21 NOTE — Patient Instructions (Signed)
Vitamin B12 Injection What is this medication? Vitamin B12 (VAHY tuh min B12) prevents and treats low vitamin B12 levels in your body. It is used in people who do not get enough vitamin B12 from their diet or when their digestive tract does not absorb enough. Vitamin B12 plays an important role in maintaining the health of your nervous system and red blood cells. This medicine may be used for other purposes; ask your health care provider or pharmacist if you have questions. COMMON BRAND NAME(S): B-12 Compliance Kit, B-12 Injection Kit, Cyomin, Dodex, LA-12, Nutri-Twelve, Physicians EZ Use B-12, Primabalt What should I tell my care team before I take this medication? They need to know if you have any of these conditions: Kidney disease Leber's disease Megaloblastic anemia An unusual or allergic reaction to cyanocobalamin, cobalt, other medications, foods, dyes, or preservatives Pregnant or trying to get pregnant Breast-feeding How should I use this medication? This medication is injected into a muscle or deeply under the skin. It is usually given in a clinic or care team's office. However, your care team may teach you how to inject yourself. Follow all instructions. Talk to your care team about the use of this medication in children. Special care may be needed. Overdosage: If you think you have taken too much of this medicine contact a poison control center or emergency room at once. NOTE: This medicine is only for you. Do not share this medicine with others. What if I miss a dose? If you are given your dose at a clinic or care team's office, call to reschedule your appointment. If you give your own injections, and you miss a dose, take it as soon as you can. If it is almost time for your next dose, take only that dose. Do not take double or extra doses. What may interact with this medication? Alcohol Colchicine This list may not describe all possible interactions. Give your health care  provider a list of all the medicines, herbs, non-prescription drugs, or dietary supplements you use. Also tell them if you smoke, drink alcohol, or use illegal drugs. Some items may interact with your medicine. What should I watch for while using this medication? Visit your care team regularly. You may need blood work done while you are taking this medication. You may need to follow a special diet. Talk to your care team. Limit your alcohol intake and avoid smoking to get the best benefit. What side effects may I notice from receiving this medication? Side effects that you should report to your care team as soon as possible: Allergic reactions--skin rash, itching, hives, swelling of the face, lips, tongue, or throat Swelling of the ankles, hands, or feet Trouble breathing Side effects that usually do not require medical attention (report to your care team if they continue or are bothersome): Diarrhea This list may not describe all possible side effects. Call your doctor for medical advice about side effects. You may report side effects to FDA at 1-800-FDA-1088. Where should I keep my medication? Keep out of the reach of children. Store at room temperature between 15 and 30 degrees C (59 and 85 degrees F). Protect from light. Throw away any unused medication after the expiration date. NOTE: This sheet is a summary. It may not cover all possible information. If you have questions about this medicine, talk to your doctor, pharmacist, or health care provider.  2023 Elsevier/Gold Standard (2020-10-28 00:00:00)

## 2021-10-05 ENCOUNTER — Telehealth: Payer: Self-pay | Admitting: Cardiology

## 2021-10-05 NOTE — Telephone Encounter (Signed)
Spoke to pt who stated that for the last month she has been experiencing low hr. Pt hr: 08/02- 55 08/03- 60- Pt had been doing physical activity in her garden and step in a yellow jackets nest- after being stung pt stated hr dropped to 42. 08/04- 58 08/05-68  Pt stated that she has been feeling fatigued lately like she is going to pass out. Pt denies cp/sob.   Please advise.

## 2021-10-05 NOTE — Telephone Encounter (Signed)
STAT if HR is under 50 or over 120 (normal HR is 60-100 beats per minute)  What is your heart rate?  138/73  hr 57 - 10 mins ago    Do you have a log of your heart rate readings (document readings)?  Last Wednesday 60 Stung by a bee last Wednesday - 42  Do you have any other symptoms? Pt states she is concerned with her hr. Please advise.

## 2021-10-06 NOTE — Telephone Encounter (Signed)
Spoke with pt about her low HR. She stated that she does not feel good and her heart skips. Advised to make appt to see Dr. Geraldo Pitter. Pt agreed and will call for appt.

## 2021-10-06 NOTE — Telephone Encounter (Signed)
Patient is calling to check on status of her call.  

## 2021-10-12 ENCOUNTER — Ambulatory Visit: Payer: Medicare HMO | Admitting: Cardiology

## 2021-10-12 ENCOUNTER — Encounter: Payer: Self-pay | Admitting: Cardiology

## 2021-10-12 VITALS — BP 124/58 | HR 72 | Ht 67.0 in | Wt 177.4 lb

## 2021-10-12 DIAGNOSIS — E782 Mixed hyperlipidemia: Secondary | ICD-10-CM | POA: Diagnosis not present

## 2021-10-12 DIAGNOSIS — I10 Essential (primary) hypertension: Secondary | ICD-10-CM

## 2021-10-12 DIAGNOSIS — I700 Atherosclerosis of aorta: Secondary | ICD-10-CM

## 2021-10-12 DIAGNOSIS — N1832 Chronic kidney disease, stage 3b: Secondary | ICD-10-CM

## 2021-10-12 DIAGNOSIS — I4729 Other ventricular tachycardia: Secondary | ICD-10-CM

## 2021-10-12 MED ORDER — METOPROLOL SUCCINATE ER 25 MG PO TB24
25.0000 mg | ORAL_TABLET | Freq: Every day | ORAL | 3 refills | Status: DC
Start: 2021-10-12 — End: 2021-12-03

## 2021-10-12 NOTE — Patient Instructions (Signed)
FDA-cleared personal EKG: The world's most clinically validated personal EKG, FDA-cleared to detect Atrial Fibrillation, Bradycardia, and Tachycardia. Rebecca Orr is the most reliable way to check in on your heart from home. Take your EKG from anywhere: Capture a medical-grade EKG in 30 seconds and get an instant analysis right on your smartphone. Rebecca Orr is small enough to fit in your pocket, so you can take it with you anywhere. Easy to use: Simply place your fingers on the sensors--no wires, patches, or gels. Recommended by doctors: A trusted resource, Rebecca Orr is the #1 doctor-recommended personal EKG with more than 100 million EKGs recorded. Save or share your EKGs: With the press of a button, email your EKGs to your doctor or save them on your phone. Works with smartphones: Compatible with Tour manager and tablets. Check our compatibility chart. FSA/HSA eligible: Purchase using an FSA or HSA account (please confirm coverage with your insurance provider). Phone clip included with purchase, a $15 value. Conveniently take your device with you wherever you go.  [email or web address]   Step One- Record your EKG strip on [facility] app.   Step two- On Kardia EKG click "Download"   Step three- It will prompt you to make a password for this EKG. Please make the password "Orr" so that we can view it.   Step four- Click on the little "upload" button (small box with an arrow in the middle) in the bottom left-hand corner of the screen.   Step five- Click "Save to Files"  Step six- Click on "On my iphone" and then "Pages" then press save in the top right-hand corner.   NOW GO TO MYCHART   Once on MyChart click "Messages"  Step one- Click "Send a message"  Step two- Click "Ask a medical question"   Step three- Click "Non urgent medical question"   Step four- Click on Rebecca Orr's name.  Step five- Click on the small  paperclip at the bottom of the screen  Step six- Click "Choose file"  Step seven- Pick the most recent EKG strip listed.   Once uploaded send the message!  Medication Instructions:  Your physician has recommended you make the following change in your medication:   Increase your Metoprolol to 25 mg (1 tablet) dally.  *If you need a refill on your cardiac medications before your next appointment, please call your pharmacy*   Lab Work: None ordered If you have labs (blood work) drawn today and your tests are completely normal, you will receive your results only by: [place] (if you have MyChart) OR A paper copy in the mail If you have any lab test that is abnormal or we need to change your treatment, we will call you to review the results.   Testing/Procedures: None ordered   Follow-Up: At [facility], you and your health needs are our priority.  As part of our continuing mission to provide you with exceptional heart care, we have created designated Provider Care Teams.  These Care Teams include your primary Cardiologist (physician) and Advanced Practice Providers (APPs -  Physician Assistants and Nurse Practitioners) who all work together to provide you with the care you need, when you need it.  We recommend signing up for the patient portal called "MyChart".  Sign up information is provided on this After Visit Summary.  MyChart is used to connect with patients for Virtual Visits (Telemedicine).  Patients are able to view lab/test results, encounter notes, upcoming appointments, etc.  Non-urgent messages can be  sent to your provider as well.   To learn more about what you can do with MyChart, go to [email or web address].    Your next appointment:   6 month(s)  The format for your next appointment:   In Person  Provider:   Jyl Heinz, MD   Other Instructions NA

## 2021-10-12 NOTE — Progress Notes (Signed)
Cardiology Office Note:    Date:  10/12/2021   ID:  Rebecca Orr, DOB [birth year], MRN [id number]  PCP:  Rochel Brome, MD  Cardiologist:  Jenean Lindau, MD   Referring MD: Rochel Brome, MD    ASSESSMENT:    1. Essential hypertension   2. Mixed hyperlipidemia   3. NSVT (nonsustained ventricular tachycardia) (HCC)   4. Abdominal aortic atherosclerosis ([place])   5. Chronic renal impairment, stage 3b (HCC)    PLAN:    In order of problems listed above:  Aortic atherosclerosis: Secondary prevention stressed with the patient.  Importance of compliance with diet medication stressed and she vocalized understanding. Mixed dyslipidemia: Statin therapy was revisited.  She is not keen on taking any lipid-lowering medications.  Risks emphasized and she understands and questions were answered to her satisfaction. Palpitations: I discussed this with her at length.  I discussed cardiac self monitoring and also discussed 1 month monitor.  She is going to think about this and let me know.  In the interim I will increase metoprolol to 25 mg daily.  I have asked her to keep yourself well-hydrated. Nonsustained ventricular tachycardia: Stable at this time. Results of cardiac testing discussed with the patient at length and questions were answered to her satisfaction Renal insufficiency: Stable and followed by primary care. Patient will be seen in follow-up appointment in 6 months or earlier if the patient has any concerns    Medication Adjustments/Labs and Tests Ordered: Current medicines are reviewed at length with the patient today.  Concerns regarding medicines are outlined above.  No orders of the defined types were placed in this encounter.  No orders of the defined types were placed in this encounter.    No chief complaint on file.    History of Present Illness:    Rebecca Orr is a 81 y.o. female.  Patient has past medical history of abdominal aortic  atherosclerosis, mixed dyslipidemia, palpitations and nonsustained ventricular tachycardia.  She mentions to me that she is fine overall.  She occasionally has palpitations.  No chest pain orthopnea or PND.  No syncope or dizziness.  She leads a sedentary lifestyle.  At the time of my evaluation, the patient is alert awake oriented and in no distress.  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   Irregular heart beats 06/28/2021   LUQ abdominal pain 06/04/2021   Mixed hyperlipidemia 01/01/2021   NSVT (nonsustained ventricular tachycardia) ([place]) 08/07/2021   Pain of lower extremity 05/20/2020   Personal history of colon cancer, stage I 07/18/2015   Rash 01/01/2021   Stage 3b chronic kidney disease ([place]) 08/20/2019   Tinea versicolor 05/01/2021   Transient ischemic attack    Weakness 06/28/2021    Past Surgical History:  Procedure Laterality Date   CATARACT EXTRACTION     CHOLECYSTECTOMY     HEMICOLECTOMY  2017   SPHINCTEROTOMY  2011    Current Medications: Current Meds  Medication Sig   Cyanocobalamin (B-12 IJ) Inject as directed every 30 (thirty) days.  famotidine (PEPCID) 40 MG tablet Take 40 mg by mouth daily.   metoprolol succinate (TOPROL XL) 25 MG 24 hr tablet Take 0.5 tablets (12.5 mg total) by mouth daily.   pregabalin (LYRICA) 25 MG capsule Take 1 capsule (25 mg total) by mouth 3 (three) times daily.     Allergies:   Nsaids, Aspirin, Atenolol, Naproxen,  Propranolol, and Tizanidine   Social History   Socioeconomic History   Marital status: Widowed    Spouse name: Not on file   Number of children: Not on file   Years of education: Not on file   Highest education level: Not on file  Occupational History   Not on file  Tobacco Use   Smoking status: Never   Smokeless tobacco: Never  Vaping Use   Vaping Use: Never used  Substance and Sexual Activity   Alcohol use: Never   Drug use: Never   Sexual activity: Not on file  Other Topics Concern   Not on file  Social History Narrative   Not on file   Social Determinants of Health   Financial Resource Strain: Low Risk  (02/10/2021)   Overall Financial Resource Strain (CARDIA)    Difficulty of Paying Living Expenses: Not hard at all  Food Insecurity: No Food Insecurity (02/10/2021)   Hunger Vital Sign    Worried About Running Out of Food in the Last Year: Never true    [place] in the Last Year: Never true  Transportation Needs: No Transportation Needs (02/10/2021)   PRAPARE - Hydrologist (Medical): No    Lack of Transportation (Non-Medical): No  Physical Activity: Insufficiently Active (02/10/2021)   Exercise Vital Sign    Days of Exercise per Week: 5 days    Minutes of Exercise per Session: 20 min  Stress: No Stress Concern Present (02/10/2021)   [place]    Feeling of Stress : Not at all  Social Connections: Moderately Integrated (02/10/2021)   Social Connection and Isolation Panel [NHANES]    Frequency of Communication with Friends and Family: More than three times a week    Frequency of Social Gatherings with Friends and Family: Three times a week    Attends Religious Services: More than 4 times per year    Active Member of Clubs or Organizations: Yes    Attends Archivist Meetings: 1 to 4 times per year    Marital Status: Widowed     Family History: The  patient's family history includes Breast cancer in her paternal grandmother; Dementia in her mother; Heart disease in her father; Lung cancer in her father; Prostate cancer in her brother; Renal cancer in her sister.  ROS:   Please see the history of present illness.    All other systems reviewed and are negative.  EKGs/Labs/Other Studies Reviewed:    The following studies were reviewed today: I discussed my findings with the patient at length.   Recent Labs: 06/24/2021: ALT 18; Hemoglobin 12.1; Platelets 188; TSH 2.400 08/07/2021: BUN 17; Creatinine, Ser 1.41; Potassium 4.8; Sodium 146  Recent Lipid Panel    Component Value Date/Time   CHOL 220 (H) 06/04/2021 1054   TRIG 85 06/04/2021 1054   HDL 73 06/04/2021 1054   CHOLHDL 3.0 06/04/2021 1054   LDLCALC 132 (H) 06/04/2021 1054    Physical Exam:    VS:  BP (!) 124/58   Pulse 72   Ht '5\' 7"'$  (  1.702 m)   Wt 177 lb 6.4 oz (80.5 kg)   SpO2 97%   BMI 27.78 kg/m     Wt Readings from Last 3 Encounters:  10/12/21 177 lb 6.4 oz (80.5 kg)  09/21/21 180 lb (81.6 kg)  08/18/21 181 lb (82.1 kg)     GEN: Patient is in no acute distress HEENT: Normal NECK: No JVD; No carotid bruits LYMPHATICS: No lymphadenopathy CARDIAC: Hear sounds regular, 2/6 systolic murmur at the apex. RESPIRATORY:  Clear to auscultation without rales, wheezing or rhonchi  ABDOMEN: Soft, non-tender, non-distended MUSCULOSKELETAL:  No edema; No deformity  SKIN: Warm and dry NEUROLOGIC:  Alert and oriented x 3 PSYCHIATRIC:  Normal affect   Signed, Jenean Lindau, MD  10/12/2021 3:38 PM    [facility]

## 2021-10-26 ENCOUNTER — Inpatient Hospital Stay: Payer: Medicare HMO | Attending: Oncology

## 2021-10-26 ENCOUNTER — Other Ambulatory Visit: Payer: Self-pay | Admitting: Pharmacist

## 2021-10-26 VITALS — BP 135/54 | HR 62 | Temp 98.1°F | Resp 18 | Ht 67.0 in | Wt 177.0 lb

## 2021-10-26 DIAGNOSIS — E538 Deficiency of other specified B group vitamins: Secondary | ICD-10-CM | POA: Diagnosis not present

## 2021-10-26 MED ORDER — CYANOCOBALAMIN 1000 MCG/ML IJ SOLN
1000.0000 ug | Freq: Once | INTRAMUSCULAR | Status: AC
  Administered 2021-10-26: 1000 ug via INTRAMUSCULAR
  Filled 2021-10-26: qty 1

## 2021-10-26 NOTE — Patient Instructions (Signed)
Vitamin B12 Injection What is this medication? Vitamin B12 (VAHY tuh min B12) prevents and treats low vitamin B12 levels in your body. It is used in people who do not get enough vitamin B12 from their diet or when their digestive tract does not absorb enough. Vitamin B12 plays an important role in maintaining the health of your nervous system and red blood cells. This medicine may be used for other purposes; ask your health care provider or pharmacist if you have questions. COMMON BRAND NAME(S): B-12 Compliance Kit, B-12 Injection Kit, Cyomin, Dodex, LA-12, Nutri-Twelve, Physicians EZ Use B-12, Primabalt What should I tell my care team before I take this medication? They need to know if you have any of these conditions: Kidney disease Leber's disease Megaloblastic anemia An unusual or allergic reaction to cyanocobalamin, cobalt, other medications, foods, dyes, or preservatives Pregnant or trying to get pregnant Breast-feeding How should I use this medication? This medication is injected into a muscle or deeply under the skin. It is usually given in a clinic or care team's office. However, your care team may teach you how to inject yourself. Follow all instructions. Talk to your care team about the use of this medication in children. Special care may be needed. Overdosage: If you think you have taken too much of this medicine contact a poison control center or emergency room at once. NOTE: This medicine is only for you. Do not share this medicine with others. What if I miss a dose? If you are given your dose at a clinic or care team's office, call to reschedule your appointment. If you give your own injections, and you miss a dose, take it as soon as you can. If it is almost time for your next dose, take only that dose. Do not take double or extra doses. What may interact with this medication? Alcohol Colchicine This list may not describe all possible interactions. Give your health care  provider a list of all the medicines, herbs, non-prescription drugs, or dietary supplements you use. Also tell them if you smoke, drink alcohol, or use illegal drugs. Some items may interact with your medicine. What should I watch for while using this medication? Visit your care team regularly. You may need blood work done while you are taking this medication. You may need to follow a special diet. Talk to your care team. Limit your alcohol intake and avoid smoking to get the best benefit. What side effects may I notice from receiving this medication? Side effects that you should report to your care team as soon as possible: Allergic reactions--skin rash, itching, hives, swelling of the face, lips, tongue, or throat Swelling of the ankles, hands, or feet Trouble breathing Side effects that usually do not require medical attention (report to your care team if they continue or are bothersome): Diarrhea This list may not describe all possible side effects. Call your doctor for medical advice about side effects. You may report side effects to FDA at 1-800-FDA-1088. Where should I keep my medication? Keep out of the reach of children. Store at room temperature between 15 and 30 degrees C (59 and 85 degrees F). Protect from light. Throw away any unused medication after the expiration date. NOTE: This sheet is a summary. It may not cover all possible information. If you have questions about this medicine, talk to your doctor, pharmacist, or health care provider.  2023 Elsevier/Gold Standard (2020-04-24 00:00:00)

## 2021-10-27 ENCOUNTER — Telehealth: Payer: Self-pay | Admitting: Cardiology

## 2021-10-27 NOTE — Telephone Encounter (Signed)
Advised that she will use kardia mobile when she has any palpitations or concerns with her heart. Pt verbalized understanding and had no additional questions.

## 2021-10-27 NOTE — Telephone Encounter (Signed)
Patient is calling wanting to know how often she will need to use the device Dr. Geraldo Pitter is wanting her to get for at home EKG's. Please advise.

## 2021-11-12 ENCOUNTER — Other Ambulatory Visit: Payer: Self-pay | Admitting: Family Medicine

## 2021-11-23 ENCOUNTER — Inpatient Hospital Stay: Payer: Medicare HMO | Attending: Oncology

## 2021-11-23 VITALS — BP 140/46 | HR 58 | Temp 98.2°F | Resp 18 | Wt 176.4 lb

## 2021-11-23 DIAGNOSIS — E538 Deficiency of other specified B group vitamins: Secondary | ICD-10-CM | POA: Insufficient documentation

## 2021-11-23 MED ORDER — CYANOCOBALAMIN 1000 MCG/ML IJ SOLN
1000.0000 ug | Freq: Once | INTRAMUSCULAR | Status: AC
  Administered 2021-11-23: 1000 ug via INTRAMUSCULAR
  Filled 2021-11-23: qty 1

## 2021-11-24 ENCOUNTER — Telehealth: Payer: Self-pay | Admitting: Cardiology

## 2021-11-24 NOTE — Telephone Encounter (Signed)
Patient calling to get the monitor put on for her. Please advise

## 2021-11-24 NOTE — Telephone Encounter (Signed)
Patient did not need monitor put on for her.  Patient has purchased [place] as instructed.   She needs to be taught how to send her recordings to Dr. Geraldo Pitter.  Patient does not have MyChart and did not have immediate access to her email address.  Offered to text the MyChart set up information but patient declined.  She wants to go into [place] office and have staff assist with her MyChart set up. I will mail the patient the list of instructions previously given to her by Truddie Hidden, RN.

## 2021-11-24 NOTE — Telephone Encounter (Signed)
Pt will come by Thursday morning to get assistance with her kardia and MyChart.

## 2021-11-26 ENCOUNTER — Encounter: Payer: Self-pay | Admitting: Cardiology

## 2021-12-02 ENCOUNTER — Telehealth: Payer: Self-pay | Admitting: Cardiology

## 2021-12-02 NOTE — Telephone Encounter (Signed)
New Message:    Please have Dr Julien Nordmann nurse to please give her a call. She have some questions about the Cardiac Monitor that she have.

## 2021-12-02 NOTE — Telephone Encounter (Signed)
Pt will come to the office 12/03/21 for assistance with her [place]. Attempted to assist pt on the phone for 30 minutes without success.

## 2021-12-03 ENCOUNTER — Encounter: Payer: Self-pay | Admitting: Cardiology

## 2021-12-03 MED ORDER — METOPROLOL SUCCINATE ER 25 MG PO TB24: 12.5000 mg | ORAL_TABLET | Freq: Every day | ORAL | 3 refills | Status: DC

## 2021-12-25 ENCOUNTER — Encounter: Payer: Self-pay | Admitting: Cardiology

## 2021-12-28 ENCOUNTER — Inpatient Hospital Stay: Payer: Medicare HMO | Attending: Oncology

## 2021-12-28 VITALS — BP 141/55 | HR 65 | Temp 98.0°F | Resp 18 | Ht 67.0 in | Wt 177.5 lb

## 2021-12-28 DIAGNOSIS — E538 Deficiency of other specified B group vitamins: Secondary | ICD-10-CM | POA: Diagnosis not present

## 2021-12-28 MED ORDER — CYANOCOBALAMIN 1000 MCG/ML IJ SOLN
1000.0000 ug | Freq: Once | INTRAMUSCULAR | Status: AC
  Administered 2021-12-28: 1000 ug via INTRAMUSCULAR
  Filled 2021-12-28: qty 1

## 2021-12-28 NOTE — Patient Instructions (Signed)

## 2022-01-07 NOTE — Progress Notes (Signed)
Rebecca Orr  [address] [place],  [place]  [address] [phone]  Clinic Day:  01/12/2022  Referring physician: Rochel Brome, MD  ASSESSMENT & PLAN:   Assessment & Plan: B12 deficiency She will continue B12 injections every 28 days.   Personal history of colon cancer, stage I Colonoscopy in January 2021 did not reveal any evidence of recurrence. Follow up in 5 years was recommended. She remains without evidence of recurrence. My only concern is the persistent mild elevation of the CEA and so I will continue to follow her yearly.    Plan: I will call her on today's lab. We will plan to see her back in 1 year with CBC, CMP, and CEA. The patient understands the plans discussed today and is in agreement with them.  She knows to contact our office if she develops concerns prior to her next appointment.   ADDENDUM: Her CEA has improved from 5.8 to 5.1.    Rebecca Kaplan, MD  [facility] [address] [place] [address] Dept: [phone] Dept Fax: [phone]   No orders of the defined types were placed in this encounter.     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  This was found during evaluation of iron deficiency.  Preoperative CT chest, abdomen and pelvis revealed a lobular mass in the transverse colon at the hepatic flexure, a 4 mm right upper lobe pulmonary nodule, felt to likely be benign, as well as bilateral ovarian cysts and cholelithiasis with mild distention of the gallbladder. CEA was within normal limits.  The patient underwent surgical resection of her colon cancer, as well as cholecystectomy,  in June 2017.  Pathology revealed a 3.5 cm, grade 1, adenocarcinoma of the transverse colon with microscopic invasion of the muscularis propria.  Twelve nodes were negative for metastasis.  Pathology of the gallbladder revealed chronic cholecystitis and cholelithiasis.  Mismatch repair testing revealed loss of nuclear expression of MLH1 and PMS2 in less than 5% of the tumor.  Microsatellite instability was high.  BRAF V600E testing was therefore done and found to be positive.  A positive BRAF is indicative of sporadic microsatellite instability, so it is unlikely that this patient has Lynch syndrome.  Her family history is positive for her brother having prostate cancer in his late 78's, sister with renal cancer in her 49's and a maternal uncle with brain cancer in his 59's.  She was unable to tell us what type of brain tumor or kidney cancer was present, or the Gleason score of her brother's prostate cancer.  There was also a paternal grandmother who had breast cancer in her 18's.  She does not meet criteria for testing for hereditary cancer syndromes due to lack of information, so we have not pursued genetic testing.  Due to the early stage of her cancer, she did not require adjuvant chemotherapy.     She had elevation of her CEA to 5.6 in October 2017, so CT chest, abdomen and pelvis was obtained.  This did not reveal any evidence of malignancy.  The previously noted pulmonary nodule was stable.  She is a nonsmoker.  Her hemoglobin had  returned to normal, so her iron supplement was discontinued in October 2017.  At her visit in December 2017, she had recurrent mild anemia with a hemoglobin of 11.6.  She had persistent anemia at her visit in October 2018 and was found to have vitamin B12 deficiency, so she was placed on B12 injections, which she continues.  The CEA has fluctuated up and down between 4.4 and 5 since December 2017 and is not felt to be significant.  She had a colonoscopy in August 2018 by Dr.  Noberto Retort, which was negative.  She had an MRI brain, ENT, neurologic and cardiology evaluation and no specific etiology was found for her dizziness.  Annual screening bilateral mammogram and bone density scan in January 2021 were normal.  She underwent colonoscopy with Dr. Lyda Jester in January 2021, which revealed diverticular disease right colon, and internal and external hemorrhoids, but was otherwise negative.  He told her she could repeat this in 5 years.  She underwent EGD back in November 2020, which revealed erosive duodenitis, gastritis, and a hiatal hernia.   INTERVAL HISTORY:  [name] is here today for repeat clinical assessment. She reports her leg and feet have been giving her trouble.She continues potassium daily. She will receive her next B12 injection 12/26. We will add CEA to today's labs. She denies continued abdominal pain any changes in her bowels, melena or hematochezia. She denies pain. She denies any blood in the stool. She denies fevers or chills. Her appetite is good. Her weight has dropped 1 pound.    REVIEW OF SYSTEMS:  Review of Systems  Constitutional:  Negative for appetite change, chills, fatigue, fever and unexpected weight change.  HENT:   Negative for lump/mass, mouth sores and sore throat.   Respiratory:  Negative for cough and shortness of breath.   Cardiovascular:  Negative for chest pain and leg swelling.  Gastrointestinal:  Negative for abdominal pain, constipation, diarrhea, nausea and vomiting.  Endocrine: Negative for hot flashes.  Genitourinary:  Negative for difficulty urinating, dysuria, frequency and hematuria.   Musculoskeletal:  Positive for back pain (intermittent, mild right lower back). Negative for arthralgias and myalgias.  Skin:  Negative for rash.  Neurological:  Negative for dizziness and headaches.  Hematological:  Negative for adenopathy. Does not bruise/bleed easily.  Psychiatric/Behavioral:  Negative for depression and sleep disturbance.  The patient is not nervous/anxious.      VITALS:  Blood pressure (!) 164/72, pulse (!) 58, temperature (!) 97.4 F (36.3 C), temperature source Oral, resp. rate 16, height _0  (1.702 m), weight 176 lb (79.8 kg), SpO2 100 %.  Wt Readings from Last 3 Encounters:  01/25/22 177 lb 8 oz (80.5 kg)  01/12/22 176 lb (79.8 kg)  12/28/21 177 lb 8 oz (80.5 kg)    Body mass index is 27.57 kg/m.  Performance status (ECOG): 1 - Symptomatic but completely ambulatory  PHYSICAL EXAM:  Physical Exam Vitals and nursing note reviewed.  Constitutional:      General: She is not in acute distress.    Appearance: Normal appearance.  HENT:     Head: Normocephalic and atraumatic.     Mouth/Throat:     Mouth: Mucous membranes are moist.     Pharynx: Oropharynx is clear. No oropharyngeal exudate or posterior oropharyngeal erythema.  Eyes:     General: No scleral icterus.    Extraocular Movements: Extraocular movements intact.     Conjunctiva/sclera: Conjunctivae normal.     Pupils: Pupils are equal, round, and reactive to  light.  Cardiovascular:     Rate and Rhythm: Normal rate and regular rhythm.     Heart sounds: Normal heart sounds. No murmur heard.    No friction rub. No gallop.  Pulmonary:     Effort: Pulmonary effort is normal.     Breath sounds: Normal breath sounds. No wheezing, rhonchi or rales.  Abdominal:     General: There is no distension.     Palpations: Abdomen is soft. There is no mass.     Tenderness: There is no abdominal tenderness.  Musculoskeletal:        General: Normal range of motion.     Cervical back: Normal range of motion and neck supple. No tenderness.     Right lower leg: No edema.     Left lower leg: No edema.  Lymphadenopathy:     Cervical: No cervical adenopathy.  Skin:    General: Skin is warm and dry.     Coloration: Skin is not jaundiced.     Findings: No rash.  Neurological:     Mental Status: She is alert and oriented to person, place, and time.      Cranial Nerves: No cranial nerve deficit.  Psychiatric:        Mood and Affect: Mood normal.        Behavior: Behavior normal.        Thought Content: Thought content normal.     LABS:      Latest Ref Rng & Units 01/12/2022   10:14 AM 06/24/2021   11:55 AM 06/04/2021   10:54 AM  CBC  WBC 4.0 - 10.5 K/uL 4.4  4.7  5.3   Hemoglobin 12.0 - 15.0 g/dL 11.2  12.1  11.6   Hematocrit 36.0 - 46.0 % 35.2  36.2  34.8   Platelets 150 - 400 K/uL 172  188  188       Latest Ref Rng & Units 01/12/2022    1:33 PM 08/07/2021   12:01 PM 06/24/2021   11:55 AM  CMP  Glucose 70 - 99 mg/dL 86  107  102   BUN 8 - 23 mg/dL _0 Creatinine 0.44 - 1.00 mg/dL 1.49  1.41  1.51   Sodium 135 - 145 mmol/L 143  146  143   Potassium 3.5 - 5.1 mmol/L 4.1  4.8  5.1   Chloride 98 - 111 mmol/L 110  108  106   CO2 22 - 32 mmol/L _1 Calcium 8.9 - 10.3 mg/dL 9.4  9.5  9.8   Total Protein 6.5 - 8.1 g/dL 6.5   6.9   Total Bilirubin 0.3 - 1.2 mg/dL 0.6   0.4   Alkaline Phos 38 - 126 U/L 57   79   AST 15 - 41 U/L 24   23   ALT 0 - 44 U/L 17   18      Lab Results  Component Value Date   CEA1 5.1 (H) 01/12/2022   /  CEA  Date Value Ref Range Status  01/12/2022 5.1 (H) 0.0 - 4.7 ng/mL Final    Comment:    (NOTE)                             Nonsmokers          <3.9  Smokers             <5.6 [employer] Electrochemiluminescence Immunoassay (ECLIA) Values obtained with different assay methods or kits cannot be used interchangeably.  Results cannot be interpreted as absolute evidence of the presence or absence of malignant disease. Performed At: [facility] [place], [place] [id number] Rush Farmer MD [id number]    No results found for: "PSA1" No results found for: "CAN199" No results found for: "CAN125"  No results found for: "TOTALPROTELP", "ALBUMINELP", "A1GS", "A2GS", "BETS", "BETA2SER", "GAMS", "MSPIKE", "SPEI" Lab Results   Component Value Date   FERRITIN 28 08/20/2019   No results found for: "LDH"  STUDIES:  No results found.    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   Irregular heart beats 06/28/2021   LUQ abdominal pain 06/04/2021   Mixed hyperlipidemia 01/01/2021   NSVT (nonsustained ventricular tachycardia) ([place]) 08/07/2021   Pain of lower extremity 05/20/2020   Personal history of colon cancer, stage I 07/18/2015   Rash 01/01/2021   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 She has never used smokeless tobacco. She reports that she does not drink alcohol and does not use drugs.The patient is alone today.  Allergies:  Allergies  Allergen Reactions   Nsaids Other (See Comments)    GI Bleed    Aspirin Other (See Comments)   Atenolol Other (See Comments)   Naproxen Other (See Comments)   Propranolol Other (See Comments)    Unknown   Tizanidine Other (See Comments)     Dizziness     Current Medications: Current Outpatient Medications  Medication Sig Dispense Refill   Cyanocobalamin (B-12 IJ) Inject as directed every 30 (thirty) days.     famotidine (PEPCID) 40 MG tablet TAKE 1 TABLET(40 MG) BY MOUTH TWICE DAILY 180 tablet 0   metoprolol succinate (TOPROL XL) 25 MG 24 hr tablet Take 0.5 tablets (12.5 mg total) by mouth daily. 90 tablet 3   pregabalin (LYRICA) 25 MG capsule TAKE 1 CAPSULE(25 MG) BY MOUTH THREE TIMES DAILY 270 capsule 0   No current facility-administered medications for this visit.       I,Gabriella Ballesteros,acting as a scribe for Rebecca Kaplan, MD.,have documented all relevant documentation on the behalf of Rebecca Kaplan, MD,as directed by  Rebecca Kaplan, MD while in the presence of Rebecca Kaplan, MD.

## 2022-01-12 ENCOUNTER — Inpatient Hospital Stay: Payer: Medicare HMO | Attending: Oncology | Admitting: Oncology

## 2022-01-12 ENCOUNTER — Inpatient Hospital Stay: Payer: Medicare HMO

## 2022-01-12 ENCOUNTER — Other Ambulatory Visit: Payer: Self-pay | Admitting: Oncology

## 2022-01-12 ENCOUNTER — Encounter: Payer: Self-pay | Admitting: Oncology

## 2022-01-12 VITALS — BP 164/72 | HR 58 | Temp 97.4°F | Resp 16 | Ht 67.0 in | Wt 176.0 lb

## 2022-01-12 DIAGNOSIS — Z85038 Personal history of other malignant neoplasm of large intestine: Secondary | ICD-10-CM | POA: Insufficient documentation

## 2022-01-12 DIAGNOSIS — E538 Deficiency of other specified B group vitamins: Secondary | ICD-10-CM | POA: Insufficient documentation

## 2022-01-12 LAB — CBC WITH DIFFERENTIAL (CANCER CENTER ONLY)
Abs Immature Granulocytes: 0.02 10*3/uL (ref 0.00–0.07)
Basophils Absolute: 0.1 10*3/uL (ref 0.0–0.1)
Basophils Relative: 1 %
Eosinophils Absolute: 0.5 10*3/uL (ref 0.0–0.5)
Eosinophils Relative: 11 %
HCT: 35.2 % — ABNORMAL LOW (ref 36.0–46.0)
Hemoglobin: 11.2 g/dL — ABNORMAL LOW (ref 12.0–15.0)
Immature Granulocytes: 1 %
Lymphocytes Relative: 28 %
Lymphs Abs: 1.2 10*3/uL (ref 0.7–4.0)
MCH: 30 pg (ref 26.0–34.0)
MCHC: 31.8 g/dL (ref 30.0–36.0)
MCV: 94.4 fL (ref 80.0–100.0)
Monocytes Absolute: 0.5 10*3/uL (ref 0.1–1.0)
Monocytes Relative: 11 %
Neutro Abs: 2.1 10*3/uL (ref 1.7–7.7)
Neutrophils Relative %: 48 %
Platelet Count: 172 10*3/uL (ref 150–400)
RBC: 3.73 MIL/uL — ABNORMAL LOW (ref 3.87–5.11)
RDW: 13.5 % (ref 11.5–15.5)
WBC Count: 4.4 10*3/uL (ref 4.0–10.5)
nRBC: 0 % (ref 0.0–0.2)

## 2022-01-12 LAB — CMP (CANCER CENTER ONLY)
ALT: 17 U/L (ref 0–44)
AST: 24 U/L (ref 15–41)
Albumin: 3.9 g/dL (ref 3.5–5.0)
Alkaline Phosphatase: 57 U/L (ref 38–126)
Anion gap: 7 (ref 5–15)
BUN: 19 mg/dL (ref 8–23)
CO2: 26 mmol/L (ref 22–32)
Calcium: 9.4 mg/dL (ref 8.9–10.3)
Chloride: 110 mmol/L (ref 98–111)
Creatinine: 1.49 mg/dL — ABNORMAL HIGH (ref 0.44–1.00)
GFR, Estimated: 35 mL/min — ABNORMAL LOW (ref >60)
Glucose, Bld: 86 mg/dL (ref 70–99)
Potassium: 4.1 mmol/L (ref 3.5–5.1)
Sodium: 143 mmol/L (ref 135–145)
Total Bilirubin: 0.6 mg/dL (ref 0.3–1.2)
Total Protein: 6.5 g/dL (ref 6.5–8.1)

## 2022-01-13 LAB — CEA: CEA: 5.1 ng/mL — ABNORMAL HIGH (ref 0.0–4.7)

## 2022-01-14 ENCOUNTER — Other Ambulatory Visit: Payer: Self-pay | Admitting: Oncology

## 2022-01-14 DIAGNOSIS — D539 Nutritional anemia, unspecified: Secondary | ICD-10-CM

## 2022-01-25 ENCOUNTER — Inpatient Hospital Stay: Payer: Medicare HMO

## 2022-01-25 ENCOUNTER — Telehealth: Payer: Self-pay | Admitting: Hematology and Oncology

## 2022-01-25 VITALS — BP 132/54 | HR 63 | Temp 98.1°F | Resp 18 | Ht 67.0 in | Wt 177.5 lb

## 2022-01-25 DIAGNOSIS — E538 Deficiency of other specified B group vitamins: Secondary | ICD-10-CM

## 2022-01-25 DIAGNOSIS — Z85038 Personal history of other malignant neoplasm of large intestine: Secondary | ICD-10-CM | POA: Diagnosis not present

## 2022-01-25 MED ORDER — CYANOCOBALAMIN 1000 MCG/ML IJ SOLN
1000.0000 ug | Freq: Once | INTRAMUSCULAR | Status: AC
  Administered 2022-01-25: 1000 ug via INTRAMUSCULAR
  Filled 2022-01-25: qty 1

## 2022-01-25 NOTE — Telephone Encounter (Signed)
01/25/22 Left msg about inj appt rescheduled to 02/24/22 at 10am

## 2022-01-25 NOTE — Patient Instructions (Signed)

## 2022-01-30 ENCOUNTER — Encounter: Payer: Self-pay | Admitting: Hematology and Oncology

## 2022-02-11 ENCOUNTER — Encounter: Payer: Self-pay | Admitting: Cardiology

## 2022-02-11 ENCOUNTER — Ambulatory Visit: Payer: Medicare HMO | Attending: Cardiology | Admitting: Cardiology

## 2022-02-11 VITALS — BP 142/70 | HR 70 | Ht 67.0 in | Wt 177.0 lb

## 2022-02-11 DIAGNOSIS — I4729 Other ventricular tachycardia: Secondary | ICD-10-CM | POA: Diagnosis not present

## 2022-02-11 DIAGNOSIS — I10 Essential (primary) hypertension: Secondary | ICD-10-CM | POA: Diagnosis not present

## 2022-02-11 DIAGNOSIS — I700 Atherosclerosis of aorta: Secondary | ICD-10-CM

## 2022-02-11 DIAGNOSIS — E782 Mixed hyperlipidemia: Secondary | ICD-10-CM

## 2022-02-11 NOTE — Progress Notes (Signed)
Cardiology Office Note:    Date:  02/11/2022   ID:  Rebecca Orr, DOB 1940-04-17, MRN [id number]  PCP:  Rochel Brome, MD  Cardiologist:  Jenean Lindau, MD   Referring MD: Rochel Brome, MD    ASSESSMENT:    1. Abdominal aortic atherosclerosis ([place])   2. Essential hypertension   3. NSVT (nonsustained ventricular tachycardia) ([place])   4. Mixed hyperlipidemia    PLAN:    In order of problems listed above:  Primary prevention stressed with the patient.  Importance of compliance with diet medication stressed and she vocalized understanding.  She was advised to walk at least half an hour a day 5 days a week and she promises to do so. Abdominal aortic atherosclerosis: I discussed this with her at length. Mixed dyslipidemia: Lipids are elevated.  KPN sheet was reviewed.  She is not keen on any lipid lowering medications.  She tells me that she is lax with diet and excise and promises to do better.  I explained benefits risks of not being on lipid-lowering medications.  She understands and vocalized understanding and questions were answered to her satisfaction. Patient will be seen in follow-up appointment in 12 months or earlier if the patient has any concerns    Medication Adjustments/Labs and Tests Ordered: Current medicines are reviewed at length with the patient today.  Concerns regarding medicines are outlined above.  No orders of the defined types were placed in this encounter.  No orders of the defined types were placed in this encounter.    No chief complaint on file.    History of Present Illness:    Rebecca Orr is a 81 y.o. female.  Patient has past medical history of aortic atherosclerosis, essential hypertension.  She denies any problems at this time and takes care of activities of daily living.  No chest pain orthopnea or PND.  At the time of my evaluation, the patient is alert awake oriented and in no distress.  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   Irregular heart beats 06/28/2021   LUQ abdominal pain 06/04/2021   Mixed hyperlipidemia 01/01/2021   NSVT (nonsustained ventricular tachycardia) ([place]) 08/07/2021   Pain of lower extremity 05/20/2020   Personal history of colon cancer, stage I 07/18/2015   Rash 01/01/2021   Tinea versicolor 05/01/2021   Transient ischemic attack    Weakness 06/28/2021    Past Surgical History:  Procedure Laterality Date   CATARACT EXTRACTION     CHOLECYSTECTOMY     HEMICOLECTOMY  2017   SPHINCTEROTOMY  2011    Current Medications: Current Meds  Medication Sig   Cyanocobalamin (B-12 IJ) Inject as directed every 30 (thirty) days.   famotidine (PEPCID) 40 MG tablet TAKE 1 TABLET(40 MG) BY MOUTH TWICE DAILY   metoprolol succinate (TOPROL XL) 25 MG 24 hr tablet Take 0.5 tablets (12.5 mg total) by mouth daily.   pregabalin (LYRICA) 25 MG capsule TAKE 1 CAPSULE(25 MG) BY MOUTH THREE TIMES DAILY  Allergies:   Nsaids, Aspirin, Atenolol, Naproxen, Propranolol, and Tizanidine   Social History   Socioeconomic History   Marital status: Widowed    Spouse name: Not on file   Number of children: Not on file   Years of education: Not on file   Highest education level: Not on file  Occupational History   Not on file  Tobacco Use   Smoking status: Never   Smokeless tobacco: Never  Vaping Use   Vaping Use:  Never used  Substance and Sexual Activity   Alcohol use: Never   Drug use: Never   Sexual activity: Not on file  Other Topics Concern   Not on file  Social History Narrative   Not on file   Social Determinants of Health   Financial Resource Strain: Low Risk  (02/10/2021)   Overall Financial Resource Strain (CARDIA)    Difficulty of Paying Living Expenses: Not hard at all  Food Insecurity: No Food Insecurity (02/10/2021)   Hunger Vital Sign    Worried About Running Out of Food in the Last Year: Never true    [place] in the Last Year: Never true  Transportation Needs: No Transportation Needs (02/10/2021)   PRAPARE - Hydrologist (Medical): No    Lack of Transportation (Non-Medical): No  Physical Activity: Insufficiently Active (02/10/2021)   Exercise Vital Sign    Days of Exercise per Week: 5 days    Minutes of Exercise per Session: 20 min  Stress: No Stress Concern Present (02/10/2021)   [place]    Feeling of Stress : Not at all  Social Connections: Moderately Integrated (02/10/2021)   Social Connection and Isolation Panel [NHANES]    Frequency of Communication with Friends and Family: More than three times a week    Frequency of Social Gatherings with Friends and Family: Three times a week    Attends Religious Services: More than 4 times per year    Active Member of Clubs or Organizations: Yes    Attends Archivist Meetings: 1 to 4 times per year    Marital Status: Widowed     Family History: The patient's family history includes Breast cancer in her paternal grandmother; Dementia in her mother; Heart disease in her father; Lung cancer in her father; Prostate cancer in her brother; Renal cancer in her sister.  ROS:   Please see the history of present illness.    All other systems reviewed and are negative.  EKGs/Labs/Other Studies Reviewed:    The  following studies were reviewed today: I discussed my findings with the patient at length.   Recent Labs: 06/24/2021: TSH 2.400 01/12/2022: ALT 17; BUN 19; Creatinine 1.49; Hemoglobin 11.2; Platelet Count 172; Potassium 4.1; Sodium 143  Recent Lipid Panel    Component Value Date/Time   CHOL 220 (H) 06/04/2021 1054   TRIG 85 06/04/2021 1054   HDL 73 06/04/2021 1054   CHOLHDL 3.0 06/04/2021 1054   LDLCALC 132 (H) 06/04/2021 1054    Physical Exam:    VS:  BP (!) 142/70   Pulse 70   Ht '5\' 7"'$  (1.702 m)   Wt 177 lb (80.3 kg)   SpO2 96%   BMI 27.72 kg/m     Wt Readings from Last 3 Encounters:  02/11/22 177 lb (80.3 kg)  01/25/22 177 lb 8 oz (80.5 kg)  01/12/22 176 lb (79.8 kg)     GEN: Patient  is in no acute distress HEENT: Normal NECK: No JVD; No carotid bruits LYMPHATICS: No lymphadenopathy CARDIAC: Hear sounds regular, 2/6 systolic murmur at the apex. RESPIRATORY:  Clear to auscultation without rales, wheezing or rhonchi  ABDOMEN: Soft, non-tender, non-distended MUSCULOSKELETAL:  No edema; No deformity  SKIN: Warm and dry NEUROLOGIC:  Alert and oriented x 3 PSYCHIATRIC:  Normal affect   Signed, Jenean Lindau, MD  02/11/2022 1:56 PM    [facility]

## 2022-02-11 NOTE — Patient Instructions (Signed)
Medication Instructions:  Your physician recommends that you continue on your current medications as directed. Please refer to the Current Medication list given to you today.  *If you need a refill on your cardiac medications before your next appointment, please call your pharmacy*   Lab Work: None ordered If you have labs (blood work) drawn today and your tests are completely normal, you will receive your results only by: MyChart Message (if you have MyChart) OR A paper copy in the mail If you have any lab test that is abnormal or we need to change your treatment, we will call you to review the results.   Testing/Procedures: None ordered   Follow-Up: At [facility], you and your health needs are our priority.  As part of our continuing mission to provide you with exceptional heart care, we have created designated Provider Care Teams.  These Care Teams include your primary Cardiologist (physician) and Advanced Practice Providers (APPs -  Physician Assistants and Nurse Practitioners) who all work together to provide you with the care you need, when you need it.  We recommend signing up for the patient portal called "MyChart".  Sign up information is provided on this After Visit Summary.  MyChart is used to connect with patients for Virtual Visits (Telemedicine).  Patients are able to view lab/test results, encounter notes, upcoming appointments, etc.  Non-urgent messages can be sent to your provider as well.   To learn more about what you can do with MyChart, go to [email or web address].    Your next appointment:   12 month(s)  The format for your next appointment:   In Person  Provider:   Rajan Revankar, MD    Other Instructions none  Important Information About Sugar      

## 2022-02-15 ENCOUNTER — Telehealth: Payer: Self-pay | Admitting: Oncology

## 2022-02-15 NOTE — Telephone Encounter (Signed)
LVM notifying pt.  RE: CT Scan Received: 3 days ago Rebecca Kaplan, MD  [name] She will not need unless the CEA goes up again, the last one was better       Previous Messages    ----- Message ----- From: [name] Sent: 02/12/2022   9:20 AM EST To: Rebecca Kaplan, MD; Belva Chimes, LPN Subject: CT Scan                                        Good morning,  I just spoke Rebecca Orr, Rebecca Orr and she wanted to know if she would need to do any scans prior to her appt in November 2024. Please advise.   Thank you, [name]

## 2022-02-16 ENCOUNTER — Other Ambulatory Visit: Payer: Self-pay | Admitting: Family Medicine

## 2022-02-23 ENCOUNTER — Ambulatory Visit: Payer: Medicare HMO

## 2022-02-24 ENCOUNTER — Ambulatory Visit (INDEPENDENT_AMBULATORY_CARE_PROVIDER_SITE_OTHER): Payer: Medicare HMO | Admitting: Nurse Practitioner

## 2022-02-24 ENCOUNTER — Ambulatory Visit: Payer: Medicare HMO

## 2022-02-24 ENCOUNTER — Inpatient Hospital Stay: Payer: Medicare HMO

## 2022-02-24 ENCOUNTER — Encounter: Payer: Self-pay | Admitting: Nurse Practitioner

## 2022-02-24 ENCOUNTER — Encounter: Payer: Medicare HMO | Admitting: Nurse Practitioner

## 2022-02-24 VITALS — BP 124/70 | Temp 97.2°F | Resp 16 | Ht 67.0 in | Wt 171.0 lb

## 2022-02-24 DIAGNOSIS — J209 Acute bronchitis, unspecified: Secondary | ICD-10-CM | POA: Diagnosis not present

## 2022-02-24 DIAGNOSIS — R051 Acute cough: Secondary | ICD-10-CM | POA: Diagnosis not present

## 2022-02-24 MED ORDER — BENZONATATE 100 MG PO CAPS: 100.0000 mg | ORAL_CAPSULE | Freq: Two times a day (BID) | ORAL | 0 refills | Status: DC | PRN

## 2022-02-24 MED ORDER — FLUTICASONE PROPIONATE 50 MCG/ACT NA SUSP: 2.0000 | Freq: Every day | NASAL | 6 refills | Status: DC

## 2022-02-24 MED ORDER — AZITHROMYCIN 250 MG PO TABS: ORAL_TABLET | ORAL | 0 refills | Status: AC

## 2022-02-24 MED ORDER — PROMETHAZINE-DM 6.25-15 MG/5ML PO SYRP: 5.0000 mL | ORAL_SOLUTION | Freq: Four times a day (QID) | ORAL | 0 refills | Status: DC | PRN

## 2022-02-24 NOTE — Assessment & Plan Note (Addendum)
Take antibiotics as prescribed Use Flonase nasal spray daily Take Promethazine-DM up to 4 times daily for cough/congestion Drink plenty of fluids Follow-up as needed

## 2022-02-24 NOTE — Patient Instructions (Addendum)
Take antibiotics as prescribed Use Flonase nasal spray daily Take Promethazine-DM up to 4 times daily for cough/sinus congestion Drink plenty of fluids Follow-up as needed    Acute Bronchitis, Adult  Acute bronchitis is when air tubes in the lungs (bronchi) suddenly get swollen. The condition can make it hard for you to breathe. In adults, acute bronchitis usually goes away within 2 weeks. A cough caused by bronchitis may last up to 3 weeks. Smoking, allergies, and asthma can make the condition worse. What are the causes? Germs that cause cold and flu (viruses). The most common cause of this condition is the virus that causes the common cold. Bacteria. Substances that bother (irritate) the lungs, including: Smoke from cigarettes and other types of tobacco. Dust and pollen. Fumes from chemicals, gases, or burned fuel. Indoor or outdoor air pollution. What increases the risk? A weak body's defense system. This is also called the immune system. Any condition that affects your lungs and breathing, such as asthma. What are the signs or symptoms? A cough. Coughing up clear, yellow, or green mucus. Making high-pitched whistling sounds when you breathe, most often when you breathe out (wheezing). Runny or stuffy nose. Having too much mucus in your lungs (chest congestion). Shortness of breath. Body aches. A sore throat. How is this treated? Acute bronchitis may go away over time without treatment. Your doctor may tell you to: Drink more fluids. This will help thin your mucus so it is easier to cough up. Use a device that gets medicine into your lungs (inhaler). Use a vaporizer or a humidifier. These are machines that add water to the air. This helps with coughing and poor breathing. Take a medicine that thins mucus and helps clear it from your lungs. Take a medicine that prevents or stops coughing. It is not common to take an antibiotic medicine for this condition. Follow these  instructions at home:  Take over-the-counter and prescription medicines only as told by your doctor. Use an inhaler, vaporizer, or humidifier as told by your doctor. Take two teaspoons (10 mL) of honey at bedtime. This helps lessen your coughing at night. Drink enough fluid to keep your pee (urine) pale yellow. Do not smoke or use any products that contain nicotine or tobacco. If you need help quitting, ask your doctor. Get a lot of rest. Return to your normal activities when your doctor says that it is safe. Keep all follow-up visits. How is this prevented?  Wash your hands often with soap and water for at least 20 seconds. If you cannot use soap and water, use hand sanitizer. Avoid contact with people who have cold symptoms. Try not to touch your mouth, nose, or eyes with your hands. Avoid breathing in smoke or chemical fumes. Make sure to get the flu shot every year. Contact a doctor if: Your symptoms do not get better in 2 weeks. You have trouble coughing up the mucus. Your cough keeps you awake at night. You have a fever. Get help right away if: You cough up blood. You have chest pain. You have very bad shortness of breath. You faint or keep feeling like you are going to faint. You have a very bad headache. Your fever or chills get worse. These symptoms may be an emergency. Get help right away. Call your local emergency services (911 in the [place] Do not wait to see if the symptoms will go away. Do not drive yourself to the hospital. Summary Acute bronchitis is when air tubes in  the lungs (bronchi) suddenly get swollen. In adults, acute bronchitis usually goes away within 2 weeks. Drink more fluids. This will help thin your mucus so it is easier to cough up. Take over-the-counter and prescription medicines only as told by your doctor. Contact a doctor if your symptoms do not improve after 2 weeks of treatment. This information is not intended to replace advice given to you  by your health care provider. Make sure you discuss any questions you have with your health care provider. Document Revised: 06/18/2020 Document Reviewed: 06/18/2020 Elsevier Patient Education  [place].

## 2022-02-24 NOTE — Progress Notes (Signed)
Acute Office Visit  Subjective:    Patient ID: Rebecca Orr, female    DOB: 22-Jan-1941, 81 y.o.   MRN: [id number]  Chief Complaint  Patient presents with   Cough    HPI: Patient is in today for congestion, chills, fatigue, and cough.  This has been going on for about a week now. Started with sore throat, gargled with vinegar and salt water, used throat Lozenges, it helped some, she is still congested a lot, wheezing and frequently having a greenish yellow cough so wants something stronger to make her feel better.   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   Irregular heart beats 06/28/2021   LUQ abdominal pain 06/04/2021   Mixed hyperlipidemia 01/01/2021   NSVT (nonsustained ventricular tachycardia) ([place]) 08/07/2021   Pain of lower extremity 05/20/2020   Personal history of colon cancer, stage I 07/18/2015   Rash 01/01/2021   Tinea versicolor 05/01/2021   Transient ischemic attack    Weakness 06/28/2021    Past Surgical History:  Procedure Laterality Date   CATARACT EXTRACTION     CHOLECYSTECTOMY     HEMICOLECTOMY  2017   SPHINCTEROTOMY  2011    Family History  Problem Relation Age of Onset   Dementia Mother    Lung cancer Father    Heart disease Father    Renal cancer Sister    Prostate cancer Brother    Breast cancer Paternal Grandmother     Social History   Socioeconomic History    Marital status: Widowed    Spouse name: Not on file   Number of children: Not on file   Years of education: Not on file   Highest education level: Not on file  Occupational History   Not on file  Tobacco Use   Smoking status: Never   Smokeless tobacco: Never  Vaping Use   Vaping Use: Never used  Substance and Sexual Activity   Alcohol use: Never   Drug use: Never   Sexual activity: Not on file  Other Topics Concern   Not on file  Social History Narrative   Not on file   Social Determinants of Health   Financial Resource Strain: Low Risk  (02/10/2021)   Overall Financial Resource Strain (CARDIA)    Difficulty of Paying Living Expenses: Not hard at all  Food Insecurity: No Food Insecurity (02/10/2021)   Hunger Vital Sign    Worried About Running Out of Food in the Last Year: Never true    [place] in the Last Year: Never true  Transportation Needs: No Transportation Needs (02/10/2021)   PRAPARE - Hydrologist (Medical): No    Lack of Transportation (Non-Medical): No  Physical Activity: Insufficiently Active (02/10/2021)   Exercise Vital Sign    Days of Exercise per Week: 5 days    Minutes of Exercise per Session: 20 min  Stress: No Stress Concern Present (02/10/2021)   [place]  Questionnaire    Feeling of Stress : Not at all  Social Connections: Moderately Integrated (02/10/2021)   Social Connection and Isolation Panel [NHANES]    Frequency of Communication with Friends and Family: More than three times a week    Frequency of Social Gatherings with Friends and Family: Three times a week    Attends Religious Services: More than 4 times per year    Active Member of Clubs or Organizations: Yes    Attends Archivist Meetings: 1 to 4 times per year    Marital Status: Widowed  Intimate Partner Violence: Not At Risk (02/10/2021)   Humiliation, Afraid, Rape, and Kick  questionnaire    Fear of Current or Ex-Partner: No    Emotionally Abused: No    Physically Abused: No    Sexually Abused: No    Outpatient Medications Prior to Visit  Medication Sig Dispense Refill   Cyanocobalamin (B-12 IJ) Inject as directed every 30 (thirty) days.     famotidine (PEPCID) 40 MG tablet TAKE 1 TABLET(40 MG) BY MOUTH TWICE DAILY 180 tablet 0   metoprolol succinate (TOPROL XL) 25 MG 24 hr tablet Take 0.5 tablets (12.5 mg total) by mouth daily. 90 tablet 3   pregabalin (LYRICA) 25 MG capsule TAKE 1 CAPSULE(25 MG) BY MOUTH THREE TIMES DAILY 270 capsule 1   No facility-administered medications prior to visit.    Allergies  Allergen Reactions   Nsaids Other (See Comments)    GI Bleed    Aspirin Other (See Comments)   Atenolol Other (See Comments)   Naproxen Other (See Comments)   Propranolol Other (See Comments)    Unknown   Tizanidine Other (See Comments)    Dizziness     Review of Systems  Constitutional:  Positive for chills and fatigue.  HENT:  Positive for congestion and sore throat. Negative for ear pain and sinus pain.   Respiratory:  Positive for cough (productive cough; yellow greenish in color) and wheezing. Negative for shortness of breath.   Cardiovascular:  Negative for chest pain and leg swelling.  Gastrointestinal:  Negative for abdominal pain, constipation, diarrhea, nausea and vomiting.  Genitourinary:  Negative for dysuria and frequency.  Musculoskeletal:  Negative for arthralgias, back pain and myalgias.  Neurological:  Negative for dizziness and headaches.       Objective:    Physical Exam Vitals reviewed.  Constitutional:      Appearance: Normal appearance. She is normal weight.  HENT:     Right Ear: Tympanic membrane normal.     Left Ear: Tympanic membrane normal.     Nose: Congestion and rhinorrhea present.     Right Sinus: No maxillary sinus tenderness or frontal sinus tenderness.     Left Sinus: No maxillary sinus tenderness  or frontal sinus tenderness.     Mouth/Throat:     Mouth: Mucous membranes are moist.     Pharynx: Posterior oropharyngeal erythema present. No oropharyngeal exudate.  Neck:     Vascular: No carotid bruit.  Cardiovascular:     Rate and Rhythm: Normal rate and regular rhythm.     Heart sounds: Normal heart sounds.  Pulmonary:     Effort: Pulmonary effort is normal. No respiratory distress.     Breath sounds: No stridor. Wheezing (bilateral upper lobes) present. No rhonchi or rales.  Chest:     Chest wall: No tenderness.  Abdominal:     General: Abdomen is flat. Bowel sounds are normal.     Palpations: Abdomen  is soft.     Tenderness: There is no abdominal tenderness.  Musculoskeletal:     Cervical back: Normal range of motion.  Neurological:     Mental Status: She is alert and oriented to person, place, and time.  Psychiatric:        Mood and Affect: Mood normal.        Behavior: Behavior normal.    BP 124/70   Temp (!) 97.2 F (36.2 C)   Resp 16   Ht _0  (1.702 m)   Wt 171 lb (77.6 kg)   SpO2 96%   BMI 26.78 kg/m  Wt Readings from Last 3 Encounters:  02/24/22 171 lb (77.6 kg)  02/11/22 177 lb (80.3 kg)  01/25/22 177 lb 8 oz (80.5 kg)    Health Maintenance Due  Topic Date Due   DTaP/Tdap/Td (1 - Tdap) Never done   Medicare Annual Wellness (AWV)  02/10/2022    Lab Results  Component Value Date   TSH 2.400 06/24/2021   Lab Results  Component Value Date   WBC 4.4 01/12/2022   HGB 11.2 (L) 01/12/2022   HCT 35.2 (L) 01/12/2022   MCV 94.4 01/12/2022   PLT 172 01/12/2022   Lab Results  Component Value Date   NA 143 01/12/2022   K 4.1 01/12/2022   CO2 26 01/12/2022   GLUCOSE 86 01/12/2022   BUN 19 01/12/2022   CREATININE 1.49 (H) 01/12/2022   BILITOT 0.6 01/12/2022   ALKPHOS 57 01/12/2022   AST 24 01/12/2022   ALT 17 01/12/2022   PROT 6.5 01/12/2022   ALBUMIN 3.9 01/12/2022   CALCIUM 9.4 01/12/2022   ANIONGAP 7 01/12/2022   EGFR 38 (L) 08/07/2021    Lab Results  Component Value Date   CHOL 220 (H) 06/04/2021   Lab Results  Component Value Date   HDL 73 06/04/2021   Lab Results  Component Value Date   LDLCALC 132 (H) 06/04/2021   Lab Results  Component Value Date   TRIG 85 06/04/2021   Lab Results  Component Value Date   CHOLHDL 3.0 06/04/2021   Lab Results  Component Value Date   HGBA1C 5.6 07/30/2020       Assessment & Plan:   Problem List Items Addressed This Visit       Respiratory   Acute bronchitis with wheezing - Primary    Take antibiotics as prescribed Use Flonase nasal spray daily Take Promethazine-DM up to 4 times daily for cough/congestion Drink plenty of fluids Follow-up as needed       Relevant Medications   azithromycin (ZITHROMAX) 250 MG tablet   fluticasone (FLONASE) 50 MCG/ACT nasal spray     Other   Acute cough    Take plenty of fluids Take prescribed cough medicine as needed      Relevant Medications   promethazine-dextromethorphan (PROMETHAZINE-DM) 6.25-15 MG/5ML syrup   fluticasone (FLONASE) 50 MCG/ACT nasal spray   benzonatate (TESSALON) 100 MG capsule    Follow-up:  as needed I, Soyla Bainter have reviewed all documentation for this visit. The documentation on 02/24/22   for the exam, diagnosis, procedures, and orders are all accurate and complete.     An After Visit Summary was printed and given to the patient. Neil Crouch, DNP, [place] [phone]

## 2022-02-24 NOTE — Assessment & Plan Note (Signed)
Take plenty of fluids Take prescribed cough medicine as needed

## 2022-03-19 ENCOUNTER — Encounter: Payer: Self-pay | Admitting: Hematology and Oncology

## 2022-03-19 NOTE — Addendum Note (Signed)
Addended by: Juanetta Beets on: 03/19/2022 04:14 PM   Modules accepted: Orders

## 2022-03-29 ENCOUNTER — Inpatient Hospital Stay: Payer: Medicare HMO | Attending: Oncology

## 2022-03-29 VITALS — BP 170/70 | HR 72 | Temp 97.9°F | Resp 18 | Wt 174.0 lb

## 2022-03-29 DIAGNOSIS — E538 Deficiency of other specified B group vitamins: Secondary | ICD-10-CM | POA: Diagnosis not present

## 2022-03-29 MED ORDER — CYANOCOBALAMIN 1000 MCG/ML IJ SOLN
1000.0000 ug | Freq: Once | INTRAMUSCULAR | Status: AC
  Administered 2022-03-29: 1000 ug via INTRAMUSCULAR
  Filled 2022-03-29: qty 1

## 2022-03-29 NOTE — Patient Instructions (Signed)
Vitamin B12 Deficiency Vitamin B12 deficiency means that your body does not have enough vitamin B12. The body needs this important vitamin: To make red blood cells. To make genes (DNA). To help the nerves work. If you do not have enough vitamin B12 in your body, you can have health problems, such as not having enough red blood cells in the blood (anemia). What are the causes? Not eating enough foods that contain vitamin B12. Not being able to take in (absorb) vitamin B12 from the food that you eat. Certain diseases. A condition in which the body does not make enough of a certain protein. This results in your body not taking in enough vitamin B12. Having a surgery in which part of the stomach or small intestine is taken out. Taking medicines that make it hard for the body to take in vitamin B12. These include: Heartburn medicines. Some medicines that are used to treat diabetes. What increases the risk? Being an older adult. Eating a vegetarian or vegan diet that does not include any foods that come from animals. Not eating enough foods that contain vitamin B12 while you are pregnant. Taking certain medicines. Having alcoholism. What are the signs or symptoms? In some cases, there are no symptoms. If the condition leads to too few blood cells or nerve damage, symptoms can occur, such as: Feeling weak or tired. Not being hungry. Losing feeling (numbness) or tingling in your hands and feet. Redness and burning of the tongue. Feeling sad (depressed). Confusion or memory problems. Trouble walking. If anemia is very bad, symptoms can include: Being short of breath. Being dizzy. Having a very fast heartbeat. How is this treated? Changing the way you eat and drink, such as: Eating more foods that contain vitamin B12. Drinking little or no alcohol. Getting vitamin B12 shots. Taking vitamin B12 supplements by mouth (orally). Your doctor will tell you the dose that is best for you. Follow  these instructions at home: Eating and drinking  Eat foods that come from animals and have a lot of vitamin B12 in them. These include: Meats and poultry. This includes beef, pork, chicken, turkey, and organ meats, such as liver. Seafood, such as clams, rainbow trout, salmon, tuna, and haddock. Eggs. Dairy foods such as milk, yogurt, and cheese. Eat breakfast cereals that have vitamin B12 added to them (are fortified). Check the label. The items listed above may not be a complete list of foods and beverages you can eat and drink. Contact a dietitian for more information. Alcohol use Do not drink alcohol if: Your doctor tells you not to drink. You are pregnant, may be pregnant, or are planning to become pregnant. If you drink alcohol: Limit how much you have to: 0-1 drink a day for women. 0-2 drinks a day for men. Know how much alcohol is in your drink. In the [place] one drink equals one 12 oz bottle of beer (355 mL), one 5 oz glass of wine (148 mL), or one 1 oz glass of hard liquor (44 mL). General instructions Get any vitamin B12 shots if told by your doctor. Take supplements only as told by your doctor. Follow the directions. Keep all follow-up visits. Contact a doctor if: Your symptoms come back. Your symptoms get worse or do not get better with treatment. Get help right away if: You have trouble breathing. You have a very fast heartbeat. You have chest pain. You get dizzy. You faint. These symptoms may be an emergency. Get help right away. Call 911.   Do not wait to see if the symptoms will go away. Do not drive yourself to the hospital. Summary Vitamin B12 deficiency means that your body is not getting enough of the vitamin. In some cases, there are no symptoms of this condition. Treatment may include making a change in the way you eat and drink, getting shots, or taking supplements. Eat foods that have vitamin B12 in them. This information is not intended to replace advice  given to you by your health care provider. Make sure you discuss any questions you have with your health care provider. Document Revised: 10/10/2020 Document Reviewed: 10/10/2020 Elsevier Patient Education  2023 [employer]  

## 2022-04-06 ENCOUNTER — Other Ambulatory Visit: Payer: Self-pay | Admitting: Family Medicine

## 2022-04-06 DIAGNOSIS — Z1231 Encounter for screening mammogram for malignant neoplasm of breast: Secondary | ICD-10-CM

## 2022-04-13 IMAGING — MG MM DIGITAL SCREENING BILAT W/ TOMO AND CAD
8 series · 8 of 24 positions shown · non-contrast
Comparison: Previous exam(s).

ACR Breast Density Category a: The breast tissue is almost entirely
fatty.

CLINICAL DATA: Screening.

EXAM:
DIGITAL SCREENING BILATERAL MAMMOGRAM WITH TOMOSYNTHESIS AND CAD
TECHNIQUE: Bilateral screening digital craniocaudal and mediolateral oblique
mammograms were obtained. Bilateral screening digital breast
tomosynthesis was performed. The images were evaluated with
computer-aided detection.

[R MLO synth-2D]
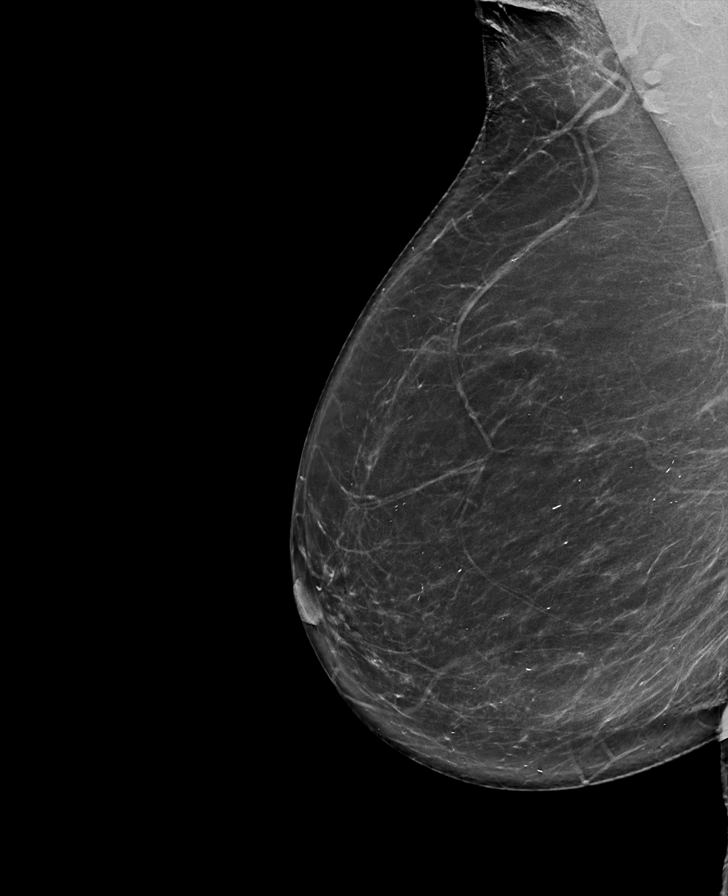

[R CC synth-2D]
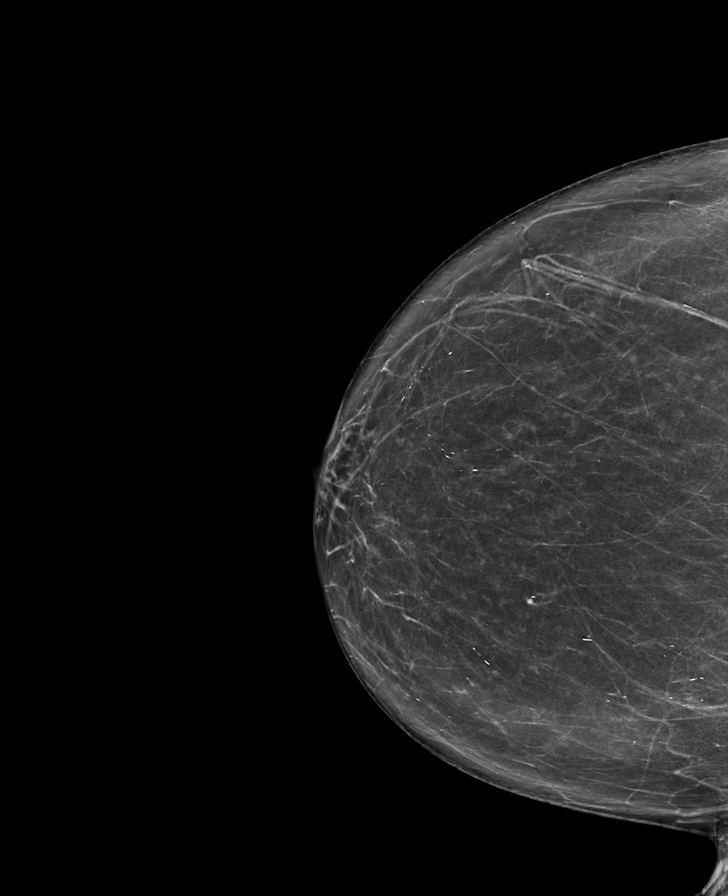

[L MLO synth-2D]
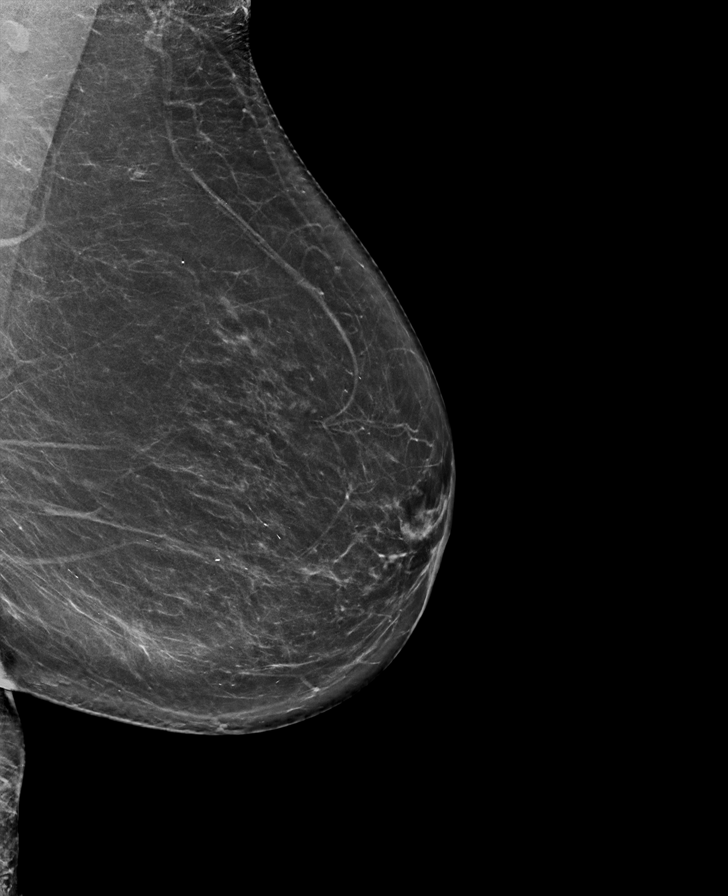

[L CC synth-2D]
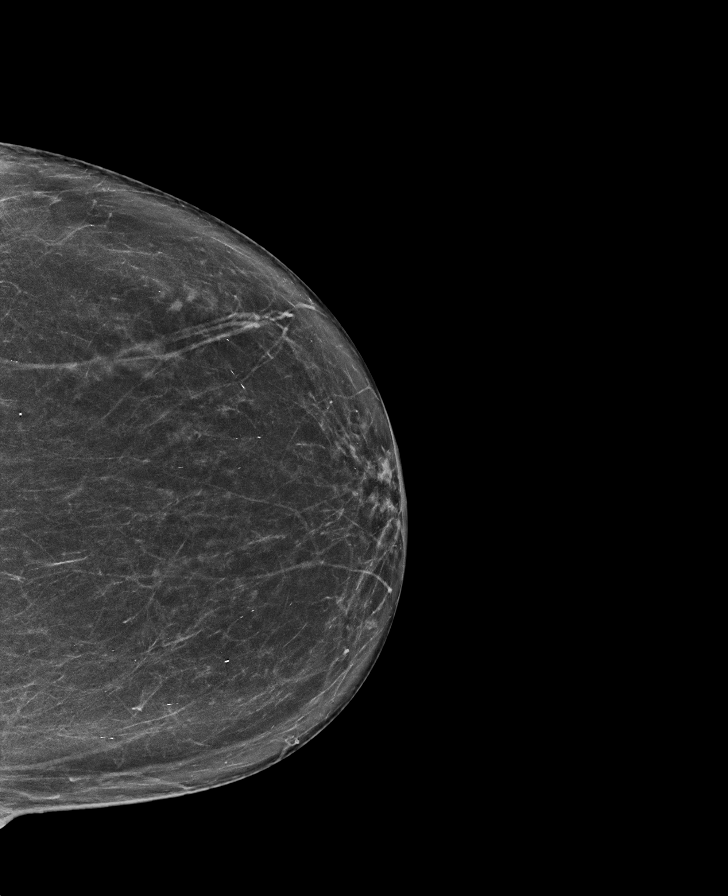

[R MLO tomo · tomo slice 41/80.0]
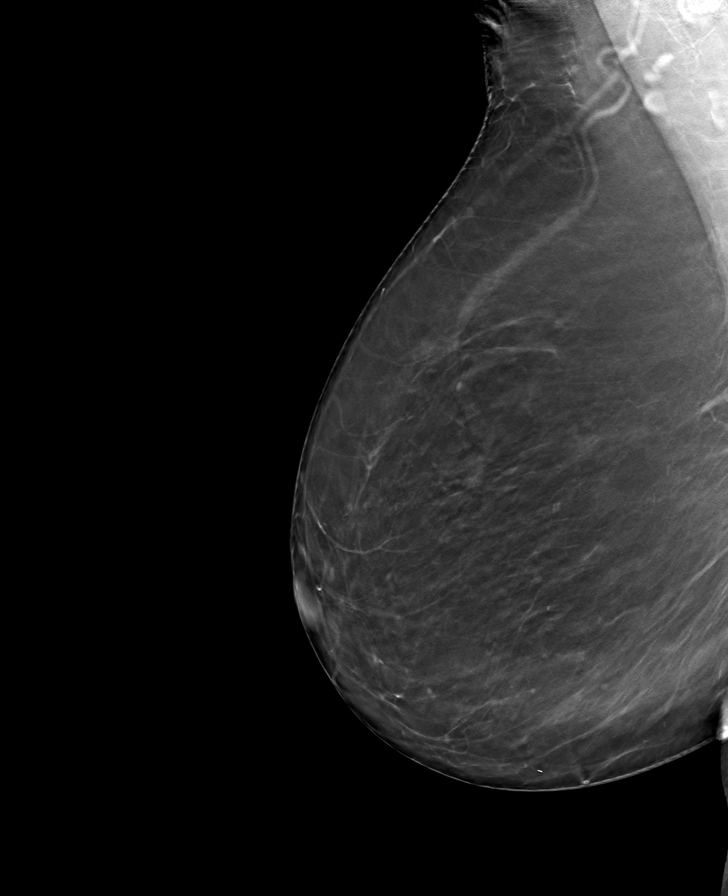

[L MLO tomo · tomo slice 40/79.0]
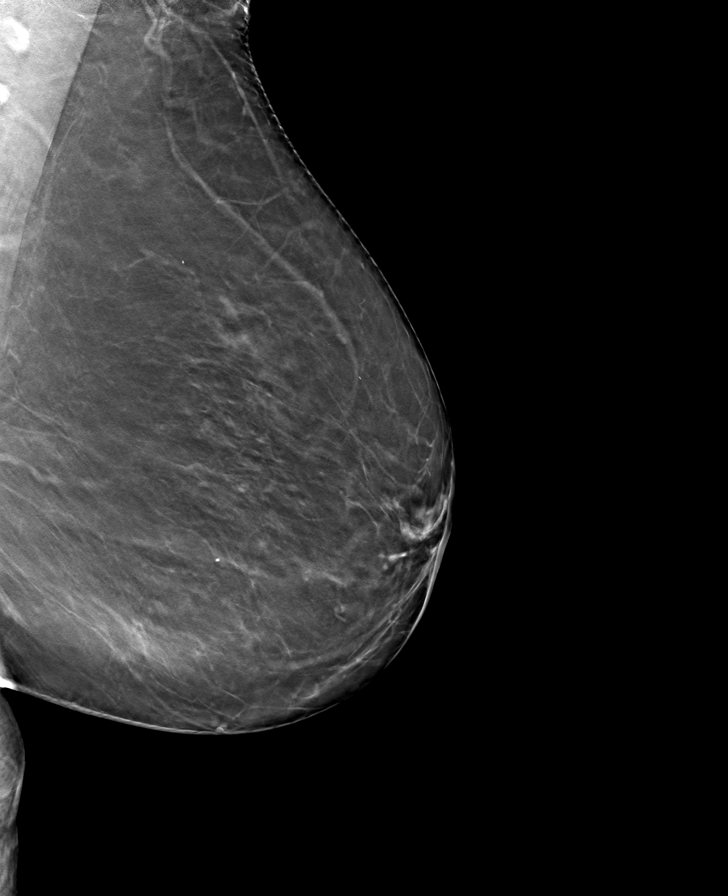

[L CC tomo · tomo slice 37/72.0]
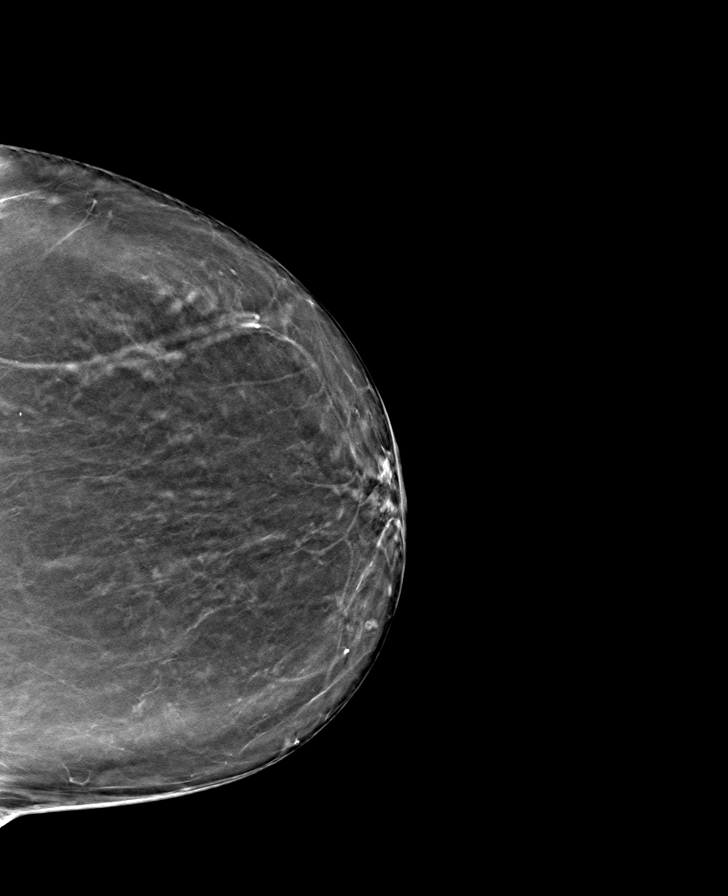

[R CC tomo · tomo slice 39/76.0]
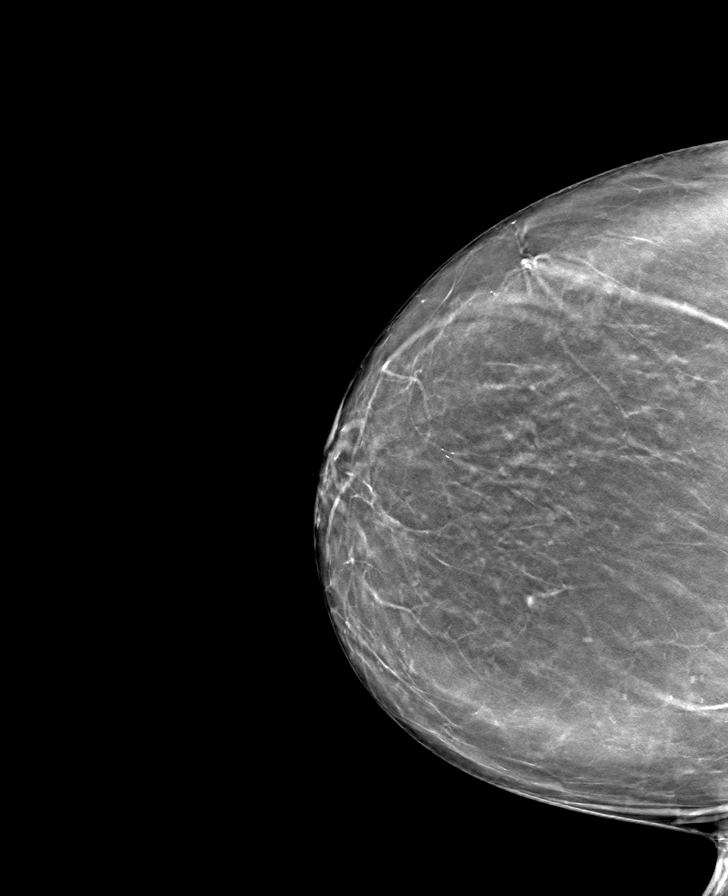

[8 of 24 positions shown; findings below may reference images not displayed]

FINDINGS: There are no findings suspicious for malignancy.
IMPRESSION: No mammographic evidence of malignancy. A result letter of this
screening mammogram will be mailed directly to the patient.

RECOMMENDATION:
Screening mammogram in one year. (Code:0E-3-N98)

BI-RADS CATEGORY  1: Negative.

## 2022-04-29 ENCOUNTER — Inpatient Hospital Stay: Payer: Medicare HMO | Attending: Oncology

## 2022-04-29 VITALS — BP 128/78 | HR 74 | Temp 98.1°F | Resp 18 | Ht 67.0 in | Wt 177.0 lb

## 2022-04-29 DIAGNOSIS — E538 Deficiency of other specified B group vitamins: Secondary | ICD-10-CM | POA: Insufficient documentation

## 2022-04-29 MED ORDER — CYANOCOBALAMIN 1000 MCG/ML IJ SOLN
1000.0000 ug | Freq: Once | INTRAMUSCULAR | Status: AC
  Administered 2022-04-29: 1000 ug via INTRAMUSCULAR
  Filled 2022-04-29: qty 1

## 2022-04-29 NOTE — Patient Instructions (Signed)
Vitamin B12 Deficiency Vitamin B12 deficiency means that your body does not have enough vitamin B12. The body needs this important vitamin: To make red blood cells. To make genes (DNA). To help the nerves work. If you do not have enough vitamin B12 in your body, you can have health problems, such as not having enough red blood cells in the blood (anemia). What are the causes? Not eating enough foods that contain vitamin B12. Not being able to take in (absorb) vitamin B12 from the food that you eat. Certain diseases. A condition in which the body does not make enough of a certain protein. This results in your body not taking in enough vitamin B12. Having a surgery in which part of the stomach or small intestine is taken out. Taking medicines that make it hard for the body to take in vitamin B12. These include: Heartburn medicines. Some medicines that are used to treat diabetes. What increases the risk? Being an older adult. Eating a vegetarian or vegan diet that does not include any foods that come from animals. Not eating enough foods that contain vitamin B12 while you are pregnant. Taking certain medicines. Having alcoholism. What are the signs or symptoms? In some cases, there are no symptoms. If the condition leads to too few blood cells or nerve damage, symptoms can occur, such as: Feeling weak or tired. Not being hungry. Losing feeling (numbness) or tingling in your hands and feet. Redness and burning of the tongue. Feeling sad (depressed). Confusion or memory problems. Trouble walking. If anemia is very bad, symptoms can include: Being short of breath. Being dizzy. Having a very fast heartbeat. How is this treated? Changing the way you eat and drink, such as: Eating more foods that contain vitamin B12. Drinking little or no alcohol. Getting vitamin B12 shots. Taking vitamin B12 supplements by mouth (orally). Your doctor will tell you the dose that is best for you. Follow  these instructions at home: Eating and drinking  Eat foods that come from animals and have a lot of vitamin B12 in them. These include: Meats and poultry. This includes beef, pork, chicken, turkey, and organ meats, such as liver. Seafood, such as clams, rainbow trout, salmon, tuna, and haddock. Eggs. Dairy foods such as milk, yogurt, and cheese. Eat breakfast cereals that have vitamin B12 added to them (are fortified). Check the label. The items listed above may not be a complete list of foods and beverages you can eat and drink. Contact a dietitian for more information. Alcohol use Do not drink alcohol if: Your doctor tells you not to drink. You are pregnant, may be pregnant, or are planning to become pregnant. If you drink alcohol: Limit how much you have to: 0-1 drink a day for women. 0-2 drinks a day for men. Know how much alcohol is in your drink. In the [place] one drink equals one 12 oz bottle of beer (355 mL), one 5 oz glass of wine (148 mL), or one 1 oz glass of hard liquor (44 mL). General instructions Get any vitamin B12 shots if told by your doctor. Take supplements only as told by your doctor. Follow the directions. Keep all follow-up visits. Contact a doctor if: Your symptoms come back. Your symptoms get worse or do not get better with treatment. Get help right away if: You have trouble breathing. You have a very fast heartbeat. You have chest pain. You get dizzy. You faint. These symptoms may be an emergency. Get help right away. Call 911.   Do not wait to see if the symptoms will go away. Do not drive yourself to the hospital. Summary Vitamin B12 deficiency means that your body is not getting enough of the vitamin. In some cases, there are no symptoms of this condition. Treatment may include making a change in the way you eat and drink, getting shots, or taking supplements. Eat foods that have vitamin B12 in them. This information is not intended to replace advice  given to you by your health care provider. Make sure you discuss any questions you have with your health care provider. Document Revised: 10/10/2020 Document Reviewed: 10/10/2020 Elsevier Patient Education  2023 [employer]  

## 2022-05-05 ENCOUNTER — Ambulatory Visit
Admission: RE | Admit: 2022-05-05 | Discharge: 2022-05-05 | Disposition: A | Discharge status: Home / Self Care | Payer: Medicare HMO | Source: Ambulatory Visit | Attending: Family Medicine | Admitting: Family Medicine

## 2022-05-05 DIAGNOSIS — Z1231 Encounter for screening mammogram for malignant neoplasm of breast: Secondary | ICD-10-CM

## 2022-05-20 NOTE — Progress Notes (Unsigned)
Subjective:  Patient ID: Rebecca Orr, female    DOB: 15-Apr-1940  Age: 82 y.o. MRN: [id number]  Chief Complaint  Patient presents with   Gastroesophageal Reflux   Hyperlipidemia    HPI Mixed hyperlipidemia/aortic atherosclerosis. Patient is not taking medicine. She tries to eat healthy. Hypertension: Taking Metoprolol 25 mg 1/2 tablet daily. GERD: Takes Famotidine 40 mg twice a day. Right knee pain: some pain. Left knee sometimes.       05/21/2022   10:43 AM 05/01/2021    9:33 AM 05/01/2021    9:32 AM 02/10/2021   10:25 AM 08/11/2020   12:54 AM  Depression screen PHQ 2/9  Decreased Interest 0 0 0 0 0  Down, Depressed, Hopeless 0 0 0 0 0  PHQ - 2 Score 0 0 0 0 0         10/26/2021   10:02 AM 12/28/2021   10:00 AM 01/12/2022   10:39 AM 01/25/2022    9:33 AM 05/21/2022   10:43 AM  Fall Risk  Falls in the past year?     0  Was there an injury with Fall?     0  Fall Risk Category Calculator     0  (RETIRED) Patient Fall Risk Level Low fall risk Low fall risk Low fall risk Low fall risk   Patient at Risk for Falls Due to     No Fall Risks  Fall risk Follow up     Falls evaluation completed      Review of Systems  Constitutional:  Negative for chills, fatigue and fever.  HENT:  Negative for congestion, ear pain, rhinorrhea and sore throat.   Respiratory:  Negative for cough and shortness of breath.   Cardiovascular:  Negative for chest pain.  Gastrointestinal:  Positive for abdominal pain. Negative for constipation, diarrhea, nausea and vomiting.  Genitourinary:  Negative for dysuria and urgency.  Musculoskeletal:  Positive for arthralgias (right knee pain.). Negative for back pain and myalgias.       Concerned about ingrown toenail.  Neurological:  Negative for dizziness, weakness, light-headedness and headaches.  Psychiatric/Behavioral:  Negative for dysphoric mood. The patient is not nervous/anxious.     Current Outpatient Medications on File Prior to  Visit  Medication Sig Dispense Refill   Cyanocobalamin (B-12 IJ) Inject as directed every 30 (thirty) days.     fluticasone (FLONASE) 50 MCG/ACT nasal spray Place 2 sprays into both nostrils daily. 16 g 6   metoprolol succinate (TOPROL XL) 25 MG 24 hr tablet Take 0.5 tablets (12.5 mg total) by mouth daily. 90 tablet 3   pregabalin (LYRICA) 25 MG capsule TAKE 1 CAPSULE(25 MG) BY MOUTH THREE TIMES DAILY 270 capsule 1   No current facility-administered medications on file prior to visit.   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   Irregular heart beats 06/28/2021   LUQ abdominal pain 06/04/2021   Mixed hyperlipidemia 01/01/2021   NSVT (nonsustained ventricular tachycardia) ([place]) 08/07/2021   Pain of lower  extremity 05/20/2020   Personal history of colon cancer, stage I 07/18/2015   Rash 01/01/2021   Tinea versicolor 05/01/2021   Transient ischemic attack    Weakness 06/28/2021   Past Surgical History:  Procedure Laterality Date   CATARACT EXTRACTION     CHOLECYSTECTOMY     HEMICOLECTOMY  2017   SPHINCTEROTOMY  2011    Family History  Problem Relation Age of Onset   Dementia Mother    Lung cancer Father    Heart disease Father    Renal cancer Sister    Prostate cancer Brother    Breast cancer Paternal Grandmother    Social History   Socioeconomic History   Marital status: Widowed    Spouse name: Not on file    Number of children: Not on file   Years of education: Not on file   Highest education level: Not on file  Occupational History   Not on file  Tobacco Use   Smoking status: Never   Smokeless tobacco: Never  Vaping Use   Vaping Use: Never used  Substance and Sexual Activity   Alcohol use: Never   Drug use: Never   Sexual activity: Not on file  Other Topics Concern   Not on file  Social History Narrative   Not on file   Social Determinants of Health   Financial Resource Strain: Low Risk  (02/10/2021)   Overall Financial Resource Strain (CARDIA)    Difficulty of Paying Living Expenses: Not hard at all  Food Insecurity: No Food Insecurity (02/10/2021)   Hunger Vital Sign    Worried About Running Out of Food in the Last Year: Never true    [place] in the Last Year: Never true  Transportation Needs: No Transportation Needs (02/10/2021)   PRAPARE - Hydrologist (Medical): No    Lack of Transportation (Non-Medical): No  Physical Activity: Insufficiently Active (02/10/2021)   Exercise Vital Sign    Days of Exercise per Week: 5 days    Minutes of Exercise per Session: 20 min  Stress: No Stress Concern Present (02/10/2021)   [place]    Feeling of Stress : Not at all  Social Connections: Moderately Integrated (02/10/2021)   Social Connection and Isolation Panel [NHANES]    Frequency of Communication with Friends and Family: More than three times a week    Frequency of Social Gatherings with Friends and Family: Three times a week    Attends Religious Services: More than 4 times per year    Active Member of Clubs or Organizations: Yes    Attends Archivist Meetings: 1 to 4 times per year    Marital Status: Widowed    Objective:  BP 138/78   Pulse 78   Temp (!) 97.5 F (36.4 C)   Ht 5\' 8"  (1.727 m)   Wt 174 lb (78.9 kg)   SpO2 98%   BMI 26.46 kg/m       05/21/2022   10:42 AM 04/29/2022    9:57 AM 03/29/2022   10:29 AM  BP/Weight  Systolic BP 0000000 0000000 [id number]  Diastolic BP 78 78 70  Wt. (Lbs) 174 177.04 174.04  BMI 26.46 kg/m2 27.73 kg/m2 27.26 kg/m2    Physical Exam Vitals reviewed.  Constitutional:      Appearance: Normal appearance. She is normal weight.  Neck:     Vascular: No carotid bruit.  Cardiovascular:  Rate and Rhythm: Normal rate and regular rhythm.     Heart sounds: Normal heart sounds.  Pulmonary:     Effort: Pulmonary effort is normal. No respiratory distress.     Breath sounds: Normal breath sounds.  Abdominal:     General: Abdomen is flat. Bowel sounds are normal.     Palpations: Abdomen is soft.     Tenderness: There is no abdominal tenderness.  Skin:    Comments: BL ingrown toenails (Right > Left.) no purulent discharge. Onychomycosis.   Neurological:     Mental Status: She is alert and oriented to person, place, and time.  Psychiatric:        Mood and Affect: Mood normal.        Behavior: Behavior normal.     Diabetic Foot Exam - Simple   No data filed      Lab Results  Component Value Date   WBC 3.7 05/21/2022   HGB 11.4 05/21/2022   HCT 35.0 05/21/2022   PLT 167 05/21/2022   GLUCOSE 104 (H) 05/21/2022   CHOL 196 05/21/2022   TRIG 97 05/21/2022   HDL 70 05/21/2022   LDLCALC 109 (H) 05/21/2022   ALT 13 05/21/2022   AST 21 05/21/2022   NA 145 (H) 05/21/2022   K 4.8 05/21/2022   CL 106 05/21/2022   CREATININE 1.40 (H) 05/21/2022   BUN 22 05/21/2022   CO2 22 05/21/2022   TSH 2.400 06/24/2021   HGBA1C 5.6 07/30/2020      Assessment & Plan:    Essential hypertension Assessment & Plan: Well controlled.  No changes to medicines. Taking Metoprolol 25 mg 1/2 tablet daily. Continue to work on eating a healthy diet and exercise.  Labs drawn today.    Orders: -     Comprehensive metabolic panel -     CBC with Differential/Platelet  Gastroesophageal reflux disease without  esophagitis Assessment & Plan: The current medical regimen is effective;  continue present plan and medications.  Takes Famotidine 40 mg twice a day.  Orders: -     Famotidine; TAKE 1 TABLET(40 MG) BY MOUTH TWICE DAILY  Dispense: 180 tablet; Refill: 1  Mixed hyperlipidemia Assessment & Plan: Recommend continue to work on eating healthy diet and exercise. Refuses Statin.  Orders: -     Lipid panel  B12 deficiency Assessment & Plan: Check labs  Orders: -     Vitamin B12  Chronic pain of right knee Assessment & Plan: Stable   Ingrown right big toenail Assessment & Plan: Referred to podiatry.  Orders: -     Ambulatory referral to Podiatry     Meds ordered this encounter  Medications   famotidine (PEPCID) 40 MG tablet    Sig: TAKE 1 TABLET(40 MG) BY MOUTH TWICE DAILY    Dispense:  180 tablet    Refill:  1    Orders Placed This Encounter  Procedures   Comprehensive metabolic panel   Lipid panel   CBC with Differential/Platelet   Vitamin B12   Ambulatory referral to Podiatry     Follow-up: Return in about 6 weeks (around 07/02/2022) for knee pain:injection.Geralynn Ochs I Leal-Borjas,acting as a scribe for Rochel Brome, MD.,have documented all relevant documentation on the behalf of Rochel Brome, MD,as directed by  Rochel Brome, MD while in the presence of Rochel Brome, MD.   An After Visit Summary was printed and given to the patient.  I attest that I have reviewed this visit and agree with  the plan scribed by my staff.   Rochel Brome, MD [facility] [phone]

## 2022-05-21 ENCOUNTER — Ambulatory Visit (INDEPENDENT_AMBULATORY_CARE_PROVIDER_SITE_OTHER): Payer: Medicare HMO | Admitting: Family Medicine

## 2022-05-21 VITALS — BP 138/78 | HR 78 | Temp 97.5°F | Ht 68.0 in | Wt 174.0 lb

## 2022-05-21 DIAGNOSIS — E538 Deficiency of other specified B group vitamins: Secondary | ICD-10-CM

## 2022-05-21 DIAGNOSIS — E782 Mixed hyperlipidemia: Secondary | ICD-10-CM

## 2022-05-21 DIAGNOSIS — I10 Essential (primary) hypertension: Secondary | ICD-10-CM

## 2022-05-21 DIAGNOSIS — L600 Ingrowing nail: Secondary | ICD-10-CM | POA: Diagnosis not present

## 2022-05-21 DIAGNOSIS — G8929 Other chronic pain: Secondary | ICD-10-CM | POA: Diagnosis not present

## 2022-05-21 DIAGNOSIS — K219 Gastro-esophageal reflux disease without esophagitis: Secondary | ICD-10-CM

## 2022-05-21 DIAGNOSIS — M25561 Pain in right knee: Secondary | ICD-10-CM | POA: Diagnosis not present

## 2022-05-21 MED ORDER — FAMOTIDINE 40 MG PO TABS: ORAL_TABLET | ORAL | 1 refills | Status: DC

## 2022-05-22 LAB — VITAMIN B12: Vitamin B-12: 758 pg/mL (ref 232–1245)

## 2022-05-22 LAB — CBC WITH DIFFERENTIAL/PLATELET
Basophils Absolute: 0 10*3/uL (ref 0.0–0.2)
Basos: 1 %
EOS (ABSOLUTE): 0.4 10*3/uL (ref 0.0–0.4)
Eos: 10 %
Hematocrit: 35 % (ref 34.0–46.6)
Hemoglobin: 11.4 g/dL (ref 11.1–15.9)
Immature Grans (Abs): 0 10*3/uL (ref 0.0–0.1)
Immature Granulocytes: 0 %
Lymphocytes Absolute: 1.2 10*3/uL (ref 0.7–3.1)
Lymphs: 32 %
MCH: 28.8 pg (ref 26.6–33.0)
MCHC: 32.6 g/dL (ref 31.5–35.7)
MCV: 88 fL (ref 79–97)
Monocytes Absolute: 0.4 10*3/uL (ref 0.1–0.9)
Monocytes: 10 %
Neutrophils Absolute: 1.8 10*3/uL (ref 1.4–7.0)
Neutrophils: 47 %
Platelets: 167 10*3/uL (ref 150–450)
RBC: 3.96 x10E6/uL (ref 3.77–5.28)
RDW: 12.4 % (ref 11.7–15.4)
WBC: 3.7 10*3/uL (ref 3.4–10.8)

## 2022-05-22 LAB — COMPREHENSIVE METABOLIC PANEL
ALT: 13 IU/L (ref 0–32)
AST: 21 IU/L (ref 0–40)
Albumin/Globulin Ratio: 1.8 (ref 1.2–2.2)
Albumin: 4.3 g/dL (ref 3.7–4.7)
Alkaline Phosphatase: 77 IU/L (ref 44–121)
BUN/Creatinine Ratio: 16 (ref 12–28)
BUN: 22 mg/dL (ref 8–27)
Bilirubin Total: 0.4 mg/dL (ref 0.0–1.2)
CO2: 22 mmol/L (ref 20–29)
Calcium: 9.6 mg/dL (ref 8.7–10.3)
Chloride: 106 mmol/L (ref 96–106)
Creatinine, Ser: 1.4 mg/dL — ABNORMAL HIGH (ref 0.57–1.00)
Globulin, Total: 2.4 g/dL (ref 1.5–4.5)
Glucose: 104 mg/dL — ABNORMAL HIGH (ref 70–99)
Potassium: 4.8 mmol/L (ref 3.5–5.2)
Sodium: 145 mmol/L — ABNORMAL HIGH (ref 134–144)
Total Protein: 6.7 g/dL (ref 6.0–8.5)
eGFR: 38 mL/min/{1.73_m2} — ABNORMAL LOW (ref >59)

## 2022-05-22 LAB — LIPID PANEL
Chol/HDL Ratio: 2.8 ratio (ref 0.0–4.4)
Cholesterol, Total: 196 mg/dL (ref 100–199)
HDL: 70 mg/dL (ref >39)
LDL Chol Calc (NIH): 109 mg/dL — ABNORMAL HIGH (ref 0–99)
Triglycerides: 97 mg/dL (ref 0–149)
VLDL Cholesterol Cal: 17 mg/dL (ref 5–40)

## 2022-05-23 ENCOUNTER — Encounter: Payer: Self-pay | Admitting: Family Medicine

## 2022-05-23 DIAGNOSIS — G8929 Other chronic pain: Secondary | ICD-10-CM | POA: Insufficient documentation

## 2022-05-23 DIAGNOSIS — L600 Ingrowing nail: Secondary | ICD-10-CM

## 2022-05-23 HISTORY — DX: Ingrowing nail: L60.0

## 2022-05-23 HISTORY — DX: Other chronic pain: G89.29

## 2022-05-23 NOTE — Assessment & Plan Note (Signed)
Referred to podiatry.

## 2022-05-23 NOTE — Assessment & Plan Note (Signed)
Stable

## 2022-05-23 NOTE — Assessment & Plan Note (Signed)
Check labs 

## 2022-05-23 NOTE — Assessment & Plan Note (Signed)
The current medical regimen is effective;  continue present plan and medications.  Takes Famotidine 40 mg twice a day.

## 2022-05-23 NOTE — Assessment & Plan Note (Signed)
Well controlled.  No changes to medicines. Taking Metoprolol 25 mg 1/2 tablet daily. Continue to work on eating a healthy diet and exercise.  Labs drawn today.

## 2022-05-23 NOTE — Assessment & Plan Note (Signed)
Recommend continue to work on eating healthy diet and exercise. ?Refuses Statin. ?

## 2022-05-24 ENCOUNTER — Telehealth: Payer: Self-pay | Admitting: Cardiology

## 2022-05-24 NOTE — Telephone Encounter (Signed)
Patient wants a call back directly from [place] regarding medication her primary doctor wants to put her on.

## 2022-05-24 NOTE — Telephone Encounter (Signed)
FYI Spoke with pt who states that Dr. Tobie Poet wanted her to start Atorvastatin 20 mg daily. Advised with her LDL being elevated and aortic atherosclerosis Atorvastatin 20 mg is recommended. Talked about starting CoQ 10 200 mg daily to help with leg pain. Pt verbalized understanding an had no additional questions.

## 2022-05-27 ENCOUNTER — Inpatient Hospital Stay: Payer: Medicare HMO | Attending: Oncology

## 2022-05-27 VITALS — BP 133/74 | HR 73 | Temp 97.5°F | Resp 18 | Wt 176.0 lb

## 2022-05-27 DIAGNOSIS — E538 Deficiency of other specified B group vitamins: Secondary | ICD-10-CM | POA: Diagnosis not present

## 2022-05-27 MED ORDER — CYANOCOBALAMIN 1000 MCG/ML IJ SOLN
1000.0000 ug | Freq: Once | INTRAMUSCULAR | Status: AC
  Administered 2022-05-27: 1000 ug via INTRAMUSCULAR
  Filled 2022-05-27: qty 1

## 2022-05-27 NOTE — Patient Instructions (Signed)

## 2022-05-28 ENCOUNTER — Ambulatory Visit: Payer: Medicare HMO

## 2022-06-02 ENCOUNTER — Encounter: Payer: Self-pay | Admitting: Hematology and Oncology

## 2022-06-07 DIAGNOSIS — L209 Atopic dermatitis, unspecified: Secondary | ICD-10-CM | POA: Diagnosis not present

## 2022-06-07 DIAGNOSIS — H1031 Unspecified acute conjunctivitis, right eye: Secondary | ICD-10-CM | POA: Diagnosis not present

## 2022-06-07 DIAGNOSIS — H1045 Other chronic allergic conjunctivitis: Secondary | ICD-10-CM | POA: Diagnosis not present

## 2022-06-08 ENCOUNTER — Ambulatory Visit: Payer: Medicare HMO | Admitting: Podiatry

## 2022-06-08 DIAGNOSIS — B351 Tinea unguium: Secondary | ICD-10-CM

## 2022-06-08 DIAGNOSIS — L600 Ingrowing nail: Secondary | ICD-10-CM

## 2022-06-08 MED ORDER — CICLOPIROX 8 % EX SOLN: Freq: Every day | CUTANEOUS | 0 refills | Status: DC

## 2022-06-08 NOTE — Patient Instructions (Signed)

## 2022-06-08 NOTE — Progress Notes (Signed)
Subjective:  Patient ID: Rebecca Orr, female    DOB: 1940-07-16,  MRN: [id number]  Chief Complaint  Patient presents with   Ingrown Toenail    POSSIBLE INGROWN ON BILATERAL GREAT HALLUX, HAS BEEN A PROBLEM FOR 6 MONTHS, PAINFUL AND INFLAMED     82 y.o. female presents presents with concern for pain on the right hallux nail medial border.  She says it is painful and inflamed and hurts when she presses on the area or when wearing certain shoes.  She also thinks the left great toenail is growing abnormally but does not cause her pain.  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   Irregular heart beats 06/28/2021   LUQ abdominal pain 06/04/2021   Mixed hyperlipidemia 01/01/2021   NSVT (nonsustained ventricular tachycardia) ([facility]) 08/07/2021   Pain of lower extremity 05/20/2020   Personal history of colon cancer, stage I 07/18/2015   Rash 01/01/2021   Tinea versicolor 05/01/2021   Transient ischemic attack    Weakness 06/28/2021    Allergies  Allergen Reactions   Nsaids Other (See Comments)    GI Bleed    Aspirin Other (See Comments)   Atenolol Other (See Comments)   Naproxen Other (See Comments)   Propranolol Other (See Comments)    Unknown   Tizanidine Other (See Comments)    Dizziness     ROS: Negative except as per HPI above  Objective:  General: AAO x3, NAD  Dermatological: Incurvation is  present along the medial nail border of the right great toe. There is localized edema without any erythema or increase in warmth around the nail border. There is no drainage or pus. There is no ascending cellulitis. No malodor. No open lesions or pre-ulcerative lesions.    Vascular:  Dorsalis Pedis artery and Posterior Tibial artery pedal pulses are 2/4 bilateral.  Capillary fill time < 3 sec to all digits.   Neruologic: Grossly intact via light touch bilateral. Protective threshold intact to all sites bilateral.   Musculoskeletal: No gross boney pedal deformities bilateral. No pain, crepitus, or limitation noted with foot and ankle range of motion bilateral. Muscular strength 5/5 in all groups tested bilateral.  Gait: Unassisted, Nonantalgic.   No images are attached to the encounter.  Assessment:   1. Ingrown nail of great toe of right foot   2. Onychomycosis      Plan:  Patient was evaluated and treated and all questions answered.    Ingrown Nail, right -Patient elects to proceed with minor surgery to remove ingrown toenail today. Consent reviewed and signed by patient. -Ingrown nail excised. See procedure note. -Educated on post-procedure care including soaking. Written instructions provided and reviewed. -Patient to follow up in 2 weeks for nail check.  Procedure: Excision of Ingrown Toenail Location: Right 1st toe medial nail borders. Anesthesia: Lidocaine 1% plain; 1.5 mL and Marcaine 0.5% plain; 1.5 mL, digital block. Skin Prep: Betadine. Dressing: Silvadene; telfa; dry, sterile, compression dressing. Technique: Following skin prep, the toe was exsanguinated and  a tourniquet was secured at the base of the toe. The affected nail border was freed, split with a nail splitter, and excised. Chemical matrixectomy was then performed with phenol and irrigated out with alcohol. The tourniquet was then removed and sterile dressing applied. Disposition: Patient tolerated procedure  well. Patient to return in 2 weeks for follow-up.   # Onychomycosis Onychomycosis -Educated on etiology of nail fungus. -eRx for Penlac 8% topical solution apply to all nails daily at night for the next 3 to 6 months  Return in about 2 weeks (around 06/22/2022) for Follow-up right hallux nail medial border phenol and alcohol matrixectomy.          Corinna Gab, DPM [facility] / [facility]

## 2022-06-09 ENCOUNTER — Telehealth: Payer: Self-pay | Admitting: Podiatry

## 2022-06-09 NOTE — Telephone Encounter (Signed)
Patient called and lvm stated she had an ingrown done yesterday. She stated the toe is red after she soaked the toenail. She picked up her prescription for her toenail. She wanted to know once she puts the solution on should she put the band aid back on.

## 2022-06-22 ENCOUNTER — Ambulatory Visit: Payer: Medicare HMO | Admitting: Podiatry

## 2022-06-22 DIAGNOSIS — L600 Ingrowing nail: Secondary | ICD-10-CM

## 2022-06-22 NOTE — Progress Notes (Signed)
Subjective: Rebecca Orr is a 82 y.o.  female returns to office today for follow up evaluation after having right Hallux medial border nail ingrown removal with phenol and alcohol matrixectomy approximately 2 weeks ago. Patient has been soaking using epsom salts and applying topical antibiotic covered with bandaid daily. Patient denies fevers, chills, nausea, vomiting. Denies any calf pain, chest pain, SOB.   Objective:  Vitals: Reviewed  General: Well developed, nourished, in no acute distress, alert and oriented x3   Dermatology: Skin is warm, dry and supple bilateral. right hallux nail border appears to be clean, dry, with mild granular tissue and surrounding scab. There is no surrounding erythema, edema, drainage/purulence. The remaining nails appear unremarkable at this time. There are no other lesions or other signs of infection present.  Neurovascular status: Intact. No lower extremity swelling; No pain with calf compression bilateral.  Musculoskeletal: Decreased tenderness to palpation of the right hallux nail fold(s). Muscular strength within normal limits bilateral.   Assesement and Plan: S/p phenol and alcohol matrixectomy to the  right hallux nail medial border, doing well.   -Continue soaking in epsom salts twice a day followed by antibiotic ointment and a band-aid. Can leave uncovered at night. Continue this until completely healed.  -If the area has not healed in 2 weeks, call the office for follow-up appointment, or sooner if any problems arise.  -Monitor for any signs/symptoms of infection. Call the office immediately if any occur or go directly to the emergency room. Call with any questions/concerns.        Corinna Gab, DPM [facility] / [facility]                   06/22/2022

## 2022-06-25 ENCOUNTER — Encounter: Payer: Self-pay | Admitting: Hematology and Oncology

## 2022-06-25 NOTE — Addendum Note (Signed)
Addended by: Domenic Schwab on: 06/25/2022 02:03 PM   Modules accepted: Orders

## 2022-06-28 ENCOUNTER — Inpatient Hospital Stay: Payer: Medicare HMO | Attending: Oncology

## 2022-06-28 VITALS — BP 133/53 | HR 71 | Temp 98.3°F | Resp 18 | Ht 67.0 in | Wt 171.8 lb

## 2022-06-28 DIAGNOSIS — E538 Deficiency of other specified B group vitamins: Secondary | ICD-10-CM | POA: Insufficient documentation

## 2022-06-28 MED ORDER — CYANOCOBALAMIN 1000 MCG/ML IJ SOLN
1000.0000 ug | Freq: Once | INTRAMUSCULAR | Status: AC
  Administered 2022-06-28: 1000 ug via INTRAMUSCULAR
  Filled 2022-06-28: qty 1

## 2022-06-28 NOTE — Patient Instructions (Signed)

## 2022-07-02 ENCOUNTER — Ambulatory Visit (INDEPENDENT_AMBULATORY_CARE_PROVIDER_SITE_OTHER): Payer: Medicare HMO | Admitting: Family Medicine

## 2022-07-02 ENCOUNTER — Encounter: Payer: Self-pay | Admitting: Family Medicine

## 2022-07-02 VITALS — BP 118/62 | HR 79 | Temp 97.1°F | Ht 67.0 in | Wt 171.0 lb

## 2022-07-02 DIAGNOSIS — G8929 Other chronic pain: Secondary | ICD-10-CM

## 2022-07-02 DIAGNOSIS — M25561 Pain in right knee: Secondary | ICD-10-CM

## 2022-07-02 NOTE — Progress Notes (Signed)
Subjective:  Patient ID: Rebecca Orr, female    DOB: [birth year]  Age: 82 y.o. MRN: [id number]  Chief Complaint  Patient presents with   Knee Pain   HPI:   Patient presents for right knee pain x couple of months, requesting knee injection. When pain is at its worse rates it 8/10.     05/21/2022   10:43 AM 05/01/2021    9:33 AM 05/01/2021    9:32 AM 02/10/2021   10:25 AM 08/11/2020   12:54 AM  Depression screen PHQ 2/9  Decreased Interest 0 0 0 0 0  Down, Depressed, Hopeless 0 0 0 0 0  PHQ - 2 Score 0 0 0 0 0        05/21/2022   10:43 AM  Fall Risk   Falls in the past year? 0  Number falls in past yr: 0  Injury with Fall? 0  Risk for fall due to : No Fall Risks  Follow up Falls evaluation completed    Patient Care Team: Blane Ohara, MD as PCP - General (Family Medicine) Dellia Beckwith, MD as Consulting Physician (Oncology) Alesia Morin., MD as Referring Physician (Surgery) Misenheimer, Marcial Pacas, MD as Consulting Physician (Gastroenterology)   Review of Systems  Constitutional:  Negative for chills, fatigue and fever.  HENT:  Negative for congestion, ear pain, rhinorrhea and sinus pain.   Respiratory:  Negative for cough and shortness of breath.   Cardiovascular:  Negative for chest pain.  Musculoskeletal:  Positive for arthralgias (knee pain).  Neurological:  Negative for dizziness and headaches.    Current Outpatient Medications on File Prior to Visit  Medication Sig Dispense Refill   ciclopirox (PENLAC) 8 % solution Apply topically at bedtime. Apply over nail and surrounding skin. Apply daily over previous coat. After seven (7) days, may remove with alcohol and continue cycle. 6.6 mL 0   Cyanocobalamin (B-12 IJ) Inject as directed every 30 (thirty) days.     famotidine (PEPCID) 40 MG tablet TAKE 1 TABLET(40 MG) BY MOUTH TWICE DAILY (Patient taking differently: Take 40 mg by mouth daily. TAKE 1 TABLET(40 MG) BY MOUTH DAILY) 180 tablet 1    fluticasone (FLONASE) 50 MCG/ACT nasal spray Place 2 sprays into both nostrils daily. 16 g 6   metoprolol succinate (TOPROL XL) 25 MG 24 hr tablet Take 0.5 tablets (12.5 mg total) by mouth daily. 90 tablet 3   No current facility-administered medications on file prior to visit.   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   Irregular heart beats 06/28/2021   LUQ abdominal pain 06/04/2021   Mixed hyperlipidemia 01/01/2021   NSVT (nonsustained ventricular tachycardia) (HCC) 08/07/2021   Pain of lower extremity 05/20/2020   Personal history of colon cancer, stage I 07/18/2015   Rash 01/01/2021   Tinea versicolor 05/01/2021   Transient ischemic attack    Weakness 06/28/2021   Past Surgical History:  Procedure Laterality Date   CATARACT EXTRACTION     CHOLECYSTECTOMY  HEMICOLECTOMY  2017   SPHINCTEROTOMY  2011    Family History  Problem Relation Age of Onset   Dementia Mother    Lung cancer Father    Heart disease Father    Renal cancer Sister    Prostate cancer Brother    Breast cancer Paternal Grandmother    Social History   Socioeconomic History   Marital status: Widowed    Spouse name: Not on file   Number of children: Not on file   Years of education: Not on file   Highest education level: Not on file  Occupational History   Not on file  Tobacco Use   Smoking status: Never   Smokeless tobacco: Never   Vaping Use   Vaping Use: Never used  Substance and Sexual Activity   Alcohol use: Never   Drug use: Never   Sexual activity: Not on file  Other Topics Concern   Not on file  Social History Narrative   Not on file   Social Determinants of Health   Financial Resource Strain: Low Risk  (02/10/2021)   Overall Financial Resource Strain (CARDIA)    Difficulty of Paying Living Expenses: Not hard at all  Food Insecurity: No Food Insecurity (02/10/2021)   Hunger Vital Sign    Worried About Running Out of Food in the Last Year: Never true    Ran Out of Food in the Last Year: Never true  Transportation Needs: No Transportation Needs (02/10/2021)   PRAPARE - Administrator, Civil Service (Medical): No    Lack of Transportation (Non-Medical): No  Physical Activity: Insufficiently Active (02/10/2021)   Exercise Vital Sign    Days of Exercise per Week: 5 days    Minutes of Exercise per Session: 20 min  Stress: No Stress Concern Present (02/10/2021)   [employer] of Occupational Health - Occupational Stress Questionnaire    Feeling of Stress : Not at all  Social Connections: Moderately Integrated (02/10/2021)   Social Connection and Isolation Panel [NHANES]    Frequency of Communication with Friends and Family: More than three times a week    Frequency of Social Gatherings with Friends and Family: Three times a week    Attends Religious Services: More than 4 times per year    Active Member of Clubs or Organizations: Yes    Attends Banker Meetings: 1 to 4 times per year    Marital Status: Widowed    Objective:  BP 118/62   Pulse 79   Temp (!) 97.1 F (36.2 C)   Ht 5\' 7"  (1.702 m)   Wt 171 lb (77.6 kg)   SpO2 99%   BMI 26.78 kg/m      07/02/2022   10:33 AM 06/28/2022   10:00 AM 05/27/2022    9:34 AM  BP/Weight  Systolic BP 118 133 133  Diastolic BP 62 53 74  Wt. (Lbs) 171 171.75 176  BMI 26.78 kg/m2 26.9 kg/m2 26.76 kg/m2    Physical  Exam Vitals reviewed.  Constitutional:      Appearance: Normal appearance.  Musculoskeletal:        General: Tenderness (rt knee. left knee normal.) present.     Comments: Patellar apprehension right knee.  Negative mcmurray's  Negative ligament laxity  Neurological:     Mental Status: She is alert.     Diabetic Foot Exam - Simple   No data filed      Lab Results  Component Value Date   WBC  3.7 05/21/2022   HGB 11.4 05/21/2022   HCT 35.0 05/21/2022   PLT 167 05/21/2022   GLUCOSE 104 (H) 05/21/2022   CHOL 196 05/21/2022   TRIG 97 05/21/2022   HDL 70 05/21/2022   LDLCALC 109 (H) 05/21/2022   ALT 13 05/21/2022   AST 21 05/21/2022   NA 145 (H) 05/21/2022   K 4.8 05/21/2022   CL 106 05/21/2022   CREATININE 1.40 (H) 05/21/2022   BUN 22 05/21/2022   CO2 22 05/21/2022   TSH 2.400 06/24/2021   HGBA1C 5.6 07/30/2020      Assessment & Plan:    Chronic pain of right knee Assessment & Plan: Risks were discussed including bleeding, infection, increase in sugars if diabetic, atrophy at site of injection, and increased pain.  After consent was obtained, using sterile technique the rt knee was prepped with alcohol.  Kenalog 80 mg and 5 ml plain Lidocaine was then injected and the needle withdrawn.  The procedure was well tolerated.   The patient is asked to continue to rest the joint for a few more days before resuming regular activities.  It may be more painful for the first 1-2 days.  Watch for fever, or increased swelling or persistent pain in the joint. Call or return to clinic prn if such symptoms occur or there is failure to improve as anticipated.   Eligable for another injection 10/03/2022.      No orders of the defined types were placed in this encounter.   No orders of the defined types were placed in this encounter.    Follow-up: Return if symptoms worsen or fail to improve.   I,Katherina A Bramblett,acting as a scribe for Blane Ohara, MD.,have documented all  relevant documentation on the behalf of Blane Ohara, MD,as directed by  Blane Ohara, MD while in the presence of Blane Ohara, MD.   An After Visit Summary was printed and given to the patient.  I attest that I have reviewed this visit and agree with the plan scribed by my staff.   Blane Ohara, MD [facility] [phone]

## 2022-07-02 NOTE — Assessment & Plan Note (Addendum)
Risks were discussed including bleeding, infection, increase in sugars if diabetic, atrophy at site of injection, and increased pain.  After consent was obtained, using sterile technique the rt knee was prepped with alcohol.  Kenalog 80 mg and 5 ml plain Lidocaine was then injected and the needle withdrawn.  The procedure was well tolerated.   The patient is asked to continue to rest the joint for a few more days before resuming regular activities.  It may be more painful for the first 1-2 days.  Watch for fever, or increased swelling or persistent pain in the joint. Call or return to clinic prn if such symptoms occur or there is failure to improve as anticipated.   Eligable for another injection 10/03/2022.

## 2022-07-11 ENCOUNTER — Other Ambulatory Visit: Payer: Self-pay | Admitting: Family Medicine

## 2022-07-11 MED ORDER — TRIAMCINOLONE ACETONIDE 40 MG/ML IJ SUSP
80.0000 mg | Freq: Once | INTRAMUSCULAR | Status: AC
Start: 2022-07-11 — End: ?

## 2022-07-28 ENCOUNTER — Inpatient Hospital Stay: Payer: Medicare HMO | Attending: Oncology

## 2022-07-28 VITALS — BP 146/62 | HR 71 | Temp 98.1°F | Resp 18 | Wt 173.0 lb

## 2022-07-28 DIAGNOSIS — E538 Deficiency of other specified B group vitamins: Secondary | ICD-10-CM | POA: Insufficient documentation

## 2022-07-28 MED ORDER — CYANOCOBALAMIN 1000 MCG/ML IJ SOLN
1000.0000 ug | Freq: Once | INTRAMUSCULAR | Status: AC
  Administered 2022-07-28: 1000 ug via INTRAMUSCULAR
  Filled 2022-07-28: qty 1

## 2022-07-28 NOTE — Patient Instructions (Signed)

## 2022-08-03 ENCOUNTER — Ambulatory Visit: Payer: Medicare HMO | Admitting: Podiatry

## 2022-08-03 DIAGNOSIS — M79674 Pain in right toe(s): Secondary | ICD-10-CM

## 2022-08-03 DIAGNOSIS — B351 Tinea unguium: Secondary | ICD-10-CM

## 2022-08-03 DIAGNOSIS — M79675 Pain in left toe(s): Secondary | ICD-10-CM | POA: Diagnosis not present

## 2022-08-03 DIAGNOSIS — L600 Ingrowing nail: Secondary | ICD-10-CM

## 2022-08-03 NOTE — Progress Notes (Signed)
  Subjective:  Patient ID: Rebecca Orr, female    DOB: 10/19/1940,  MRN: [id number]  Chief Complaint  Patient presents with   Nail Problem    Right hallux nail removed on 04/23/204, red and burning at times. Patient is applying the prescribed nail fungus topical medication since May 8th.   Routine Foot Care     82 y.o. female presents with the above complaint. History confirmed with patient. Patient presenting with pain related to dystrophic thickened elongated nails. Patient is unable to trim own nails related to nail dystrophy and/or mobility issues. Patient does not have a history of T2DM.  Patient previously had ingrown nail removed on June 22, 2022 she says that it is red and burning sometimes but has been improving.  She has been using ciclopirox topical antifungal medication since May 8  Objective:  Physical Exam: warm, good capillary refill nail exam onychomycosis of the toenails, onycholysis, and dystrophic nails DP pulses palpable, PT pulses palpable, and protective sensation intact Left Foot:  Pain with palpation of nails due to elongation and dystrophic growth.  Right Foot: Pain with palpation of nails due to elongation and dystrophic growth.   Assessment:   1. Pain due to onychomycosis of toenails of both feet   2. Ingrown nail of great toe of right foot      Plan:  Patient was evaluated and treated and all questions answered.   #Onychomycosis with pain  -Nails palliatively debrided as below. -Educated on self-care -Continue with ciclopirox 8% topical antifungal solution apply to all nails daily for the next 3 months  Procedure: Nail Debridement Rationale: Pain Type of Debridement: manual, sharp debridement. Instrumentation: Nail nipper, rotary burr. Number of Nails: 10  Return in about 9 weeks (around 10/05/2022) for RFC.         Corinna Gab, DPM [facility] / [facility]

## 2022-08-12 DIAGNOSIS — H5203 Hypermetropia, bilateral: Secondary | ICD-10-CM | POA: Diagnosis not present

## 2022-08-12 DIAGNOSIS — D3131 Benign neoplasm of right choroid: Secondary | ICD-10-CM | POA: Diagnosis not present

## 2022-08-12 DIAGNOSIS — H52223 Regular astigmatism, bilateral: Secondary | ICD-10-CM | POA: Diagnosis not present

## 2022-08-12 DIAGNOSIS — H353131 Nonexudative age-related macular degeneration, bilateral, early dry stage: Secondary | ICD-10-CM | POA: Diagnosis not present

## 2022-08-12 DIAGNOSIS — Z9849 Cataract extraction status, unspecified eye: Secondary | ICD-10-CM | POA: Diagnosis not present

## 2022-08-12 DIAGNOSIS — H35373 Puckering of macula, bilateral: Secondary | ICD-10-CM | POA: Diagnosis not present

## 2022-08-12 DIAGNOSIS — H524 Presbyopia: Secondary | ICD-10-CM | POA: Diagnosis not present

## 2022-08-12 DIAGNOSIS — Z961 Presence of intraocular lens: Secondary | ICD-10-CM | POA: Diagnosis not present

## 2022-08-12 DIAGNOSIS — H3530 Unspecified macular degeneration: Secondary | ICD-10-CM | POA: Diagnosis not present

## 2022-08-23 ENCOUNTER — Other Ambulatory Visit: Payer: Self-pay | Admitting: Podiatry

## 2022-08-27 ENCOUNTER — Inpatient Hospital Stay: Payer: Medicare HMO | Attending: Oncology

## 2022-08-27 VITALS — BP 145/59 | HR 78 | Temp 97.6°F | Resp 18 | Ht 67.0 in | Wt 172.0 lb

## 2022-08-27 DIAGNOSIS — E538 Deficiency of other specified B group vitamins: Secondary | ICD-10-CM | POA: Diagnosis not present

## 2022-08-27 MED ORDER — CYANOCOBALAMIN 1000 MCG/ML IJ SOLN
1000.0000 ug | Freq: Once | INTRAMUSCULAR | Status: AC
  Administered 2022-08-27: 1000 ug via INTRAMUSCULAR
  Filled 2022-08-27: qty 1

## 2022-08-27 NOTE — Patient Instructions (Signed)

## 2022-09-21 ENCOUNTER — Ambulatory Visit (INDEPENDENT_AMBULATORY_CARE_PROVIDER_SITE_OTHER): Payer: Medicare HMO

## 2022-09-21 VITALS — Ht 67.0 in | Wt 171.0 lb

## 2022-09-21 DIAGNOSIS — Z0000 Encounter for general adult medical examination without abnormal findings: Secondary | ICD-10-CM

## 2022-09-21 NOTE — Progress Notes (Signed)
Subjective:   Rebecca Orr is a 82 y.o. female who presents for Medicare Annual (Subsequent) preventive examination.  Visit Complete: Virtual  I connected with  Amberli Noralee Space Gignac on 09/21/22 by a audio enabled telemedicine application and verified that I am speaking with the correct person using two identifiers.  Patient Location: Home  Provider Location: Home Office  I discussed the limitations of evaluation and management by telemedicine. The patient expressed understanding and agreed to proceed.  Patient Medicare AWV questionnaire was completed by the patient on 09/20/22; I have confirmed that all information answered by patient is correct and no changes since this date.  Review of Systems     Cardiac Risk Factors include: advanced age (>68men, >1 women);hypertension     Objective:    Today's Vitals   09/21/22 1034  Weight: 171 lb (77.6 kg)  Height: 5\' 7"  (1.702 m)   Body mass index is 26.78 kg/m.     09/21/2022   10:42 AM 08/27/2022   10:09 AM 07/28/2022   10:14 AM 06/28/2022   10:12 AM 05/27/2022    9:41 AM 04/29/2022   10:18 AM 03/29/2022   10:32 AM  Advanced Directives  Does Patient Have a Medical Advance Directive? Yes No No No No No No  Type of Estate agent of [place];Living will        Does patient want to make changes to medical advance directive? No - Patient declined        Copy of Healthcare Power of Attorney in Chart? Yes - validated most recent copy scanned in chart (See row information)        Would patient like information on creating a medical advance directive?  No - Patient declined  No - Patient declined  No - Patient declined No - Patient declined    Current Medications (verified) Outpatient Encounter Medications as of 09/21/2022  Medication Sig   ciclopirox (PENLAC) 8 % solution APPLY TOPICALLY OVER NAIL AND SURROUNDING SKIN AT BEDTIME OVER PREVIOUS COAT, AFTER 7 DAYS MAY REMOVE WITH ALCOHOL AND  CONTINUE CYCLE   Cyanocobalamin (B-12 IJ) Inject as directed every 30 (thirty) days.   famotidine (PEPCID) 40 MG tablet TAKE 1 TABLET(40 MG) BY MOUTH TWICE DAILY (Patient taking differently: Take 40 mg by mouth daily. TAKE 1 TABLET(40 MG) BY MOUTH DAILY)   fluticasone (FLONASE) 50 MCG/ACT nasal spray Place 2 sprays into both nostrils daily.   metoprolol succinate (TOPROL XL) 25 MG 24 hr tablet Take 0.5 tablets (12.5 mg total) by mouth daily.   Facility-Administered Encounter Medications as of 09/21/2022  Medication   triamcinolone acetonide (KENALOG-40) injection 80 mg    Allergies (verified) Nsaids, Aspirin, Atenolol, Naproxen, Propranolol, and Tizanidine   History: 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   Irregular heart beats 06/28/2021   LUQ abdominal pain 06/04/2021   Mixed hyperlipidemia 01/01/2021   NSVT (nonsustained ventricular tachycardia) (HCC) 08/07/2021   Pain of lower extremity 05/20/2020   Personal history of colon  cancer, stage I 07/18/2015   Rash 01/01/2021   Tinea versicolor 05/01/2021   Transient ischemic attack    Weakness 06/28/2021   Past Surgical History:  Procedure Laterality Date   CATARACT EXTRACTION     CHOLECYSTECTOMY     HEMICOLECTOMY  2017   SPHINCTEROTOMY  2011   Family History  Problem Relation Age of Onset   Dementia Mother    Lung cancer Father    Heart disease Father     Renal cancer Sister    Prostate cancer Brother    Breast cancer Paternal Grandmother    Social History   Socioeconomic History   Marital status: Widowed    Spouse name: Not on file   Number of children: Not on file   Years of education: Not on file   Highest education level: Not on file  Occupational History   Not on file  Tobacco Use   Smoking status: Never   Smokeless tobacco: Never  Vaping Use   Vaping status: Never Used  Substance and Sexual Activity   Alcohol use: Never   Drug use: Never   Sexual activity: Not on file  Other Topics Concern   Not on file  Social History Narrative   Not on file   Social Determinants of Health   Financial Resource Strain: Low Risk  (09/21/2022)   Overall Financial Resource Strain (CARDIA)    Difficulty of Paying Living Expenses: Not hard at all  Food Insecurity: No Food Insecurity (09/21/2022)   Hunger Vital Sign    Worried About Running Out of Food in the Last Year: Never true    Ran Out of Food in the Last Year: Never true  Transportation Needs: No Transportation Needs (09/21/2022)   PRAPARE - Administrator, Civil Service (Medical): No    Lack of Transportation (Non-Medical): No  Physical Activity: Insufficiently Active (09/21/2022)   Exercise Vital Sign    Days of Exercise per Week: 3 days    Minutes of Exercise per Session: 30 min  Stress: No Stress Concern Present (09/21/2022)   [employer] of Occupational Health - Occupational Stress Questionnaire    Feeling of Stress : Not at all  Social Connections: Moderately Integrated (09/21/2022)   Social Connection and Isolation Panel [NHANES]    Frequency of Communication with Friends and Family: More than three times a week    Frequency of Social Gatherings with Friends and Family: More than three times a week    Attends Religious Services: More than 4 times per year    Active Member of [employer] or Organizations: Yes    Attends Banker Meetings: More than 4  times per year    Marital Status: Widowed    Tobacco Counseling Counseling given: Not Answered   Clinical Intake:  Pre-visit preparation completed: Yes  Pain : No/denies pain     BMI - recorded: 26.78 Nutritional Status: BMI 25 -29 Overweight Nutritional Risks: None Diabetes: No  How often do you need to have someone help you when you read instructions, pamphlets, or other written materials from your doctor or pharmacy?: 1 - Never  Interpreter Needed?: No  Information entered by :: Theresa Mulligan LPN   Activities of Daily Living    09/21/2022   10:40 AM 09/20/2022   11:56 AM  In your present state of health, do you have any difficulty performing the following activities:  Hearing? 0 0  Vision? 0 0  Difficulty concentrating or making decisions? 0 0  Walking or climbing stairs? 1 1  Dressing or bathing? 0 0  Doing errands, shopping? 0 0  Preparing Food and eating ? N N  Using the Toilet? N N  In the past six months, have you accidently leaked urine? N N  Do you have problems with loss of bowel control? N N  Managing your Medications? N N  Managing your Finances? N N  Housekeeping or managing your Housekeeping? N N    Patient Care Team: Blane Ohara, MD as PCP - General (Family Medicine) Dellia Beckwith, MD as Consulting Physician (Oncology) Alesia Morin., MD as Referring Physician (Surgery) Misenheimer, Marcial Pacas, MD as Consulting Physician (Gastroenterology)  Indicate any recent Medical Services you may have received from other than [facility] providers in the past year (date may be approximate).     Assessment:   This is a routine wellness examination for Sharah.  Hearing/Vision screen Hearing Screening - Comments:: Denies hearing difficulties   Vision Screening - Comments:: Wears rx glasses - up to date with routine eye exams with  [facility]  Dietary issues and exercise activities discussed:     Goals Addressed               This  Visit's Progress     Increase physical activity (pt-stated)         Depression Screen    09/21/2022   10:40 AM 05/21/2022   10:43 AM 05/01/2021    9:33 AM 05/01/2021    9:32 AM 02/10/2021   10:25 AM 08/11/2020   12:54 AM 02/25/2020   11:30 AM  PHQ 2/9 Scores  PHQ - 2 Score 0 0 0 0 0 0 0    Fall Risk    09/21/2022   10:41 AM 09/20/2022   11:56 AM 05/21/2022   10:43 AM 05/01/2021    9:31 AM 02/10/2021   10:25 AM  Fall Risk   Falls in the past year? 0 0 0 0 0  Number falls in past yr: 0 0 0 0 0  Injury with Fall? 0 0 0 0 0  Risk for fall due to : No Fall Risks  No Fall Risks  No Fall Risks  Follow up Falls prevention discussed  Falls evaluation completed Falls evaluation completed Falls evaluation completed    MEDICARE RISK AT HOME:  Medicare Risk at Home - 09/21/22 1045     Any stairs in or around the home? No    If so, are there any without handrails? No    Home free of loose throw rugs in walkways, pet beds, electrical cords, etc? Yes    Adequate lighting in your home to reduce risk of falls? Yes    Life alert? No    Use of a cane, walker or w/c? No    Grab bars in the bathroom? No    Shower chair or bench in shower? Yes    Elevated toilet seat or a handicapped toilet? Yes             TIMED UP AND GO:  Was the test performed?  No    Cognitive Function:        09/21/2022   10:42 AM 02/10/2021   10:45 AM  6CIT Screen  What Year? 0 points 0 points  What month? 0 points 0 points  What time? 0 points 0 points  Count back from 20 0 points 0 points  Months in reverse 0 points 0 points  Repeat phrase 0 points  0 points  Total Score 0 points 0 points    Immunizations Immunization History  Administered Date(s) Administered   Moderna Sars-Covid-2 Vaccination 04/27/2019, 05/30/2019, 01/18/2020    TDAP status: Due, Education has been provided regarding the importance of this vaccine. Advised may receive this vaccine at local pharmacy or Health Dept. Aware to  provide a copy of the vaccination record if obtained from local pharmacy or Health Dept. Verbalized acceptance and understanding.  Flu Vaccine status: Up to date   Screening Tests Health Maintenance  Topic Date Due   DTaP/Tdap/Td (1 - Tdap) Never done   INFLUENZA VACCINE  09/30/2022   Medicare Annual Wellness (AWV)  09/21/2023   MAMMOGRAM  05/04/2024   DEXA SCAN  Completed   HPV VACCINES  Aged Out   Pneumonia Vaccine 36+ Years old  Discontinued   COVID-19 Vaccine  Discontinued   Zoster Vaccines- Shingrix  Discontinued    Health Maintenance  Health Maintenance Due  Topic Date Due   DTaP/Tdap/Td (1 - Tdap) Never done    Colorectal cancer screening: No longer required.   Mammogram status: No longer required due to Age.  Bone Density status: Completed 03/28/19. Results reflect: Bone density results: NORMAL. Repeat every   years.  Lung Cancer Screening: (Low Dose CT Chest recommended if Age 91-80 years, 20 pack-year currently smoking OR have quit w/in 15years.) does not qualify.     Additional Screening:  Hepatitis C Screening: does not qualify; Completed    Vision Screening: Recommended annual ophthalmology exams for early detection of glaucoma and other disorders of the eye. Is the patient up to date with their annual eye exam?  Yes  Who is the provider or what is the name of the office in which the patient attends annual eye exams? [facility] If pt is not established with a provider, would they like to be referred to a provider to establish care? No .   Dental Screening: Recommended annual dental exams for proper oral hygiene   Community Resource Referral / Chronic Care Management:  CRR required this visit?  No   CCM required this visit?  No     Plan:     I have personally reviewed and noted the following in the patient's chart:   Medical and social history Use of alcohol, tobacco or illicit drugs  Current medications and supplements including opioid  prescriptions. Patient is not currently taking opioid prescriptions. Functional ability and status Nutritional status Physical activity Advanced directives List of other physicians Hospitalizations, surgeries, and ER visits in previous 12 months Vitals Screenings to include cognitive, depression, and falls Referrals and appointments  In addition, I have reviewed and discussed with patient certain preventive protocols, quality metrics, and best practice recommendations. A written personalized care plan for preventive services as well as general preventive health recommendations were provided to patient.     Tillie Rung, LPN   1/61/0960   After Visit Summary: (MyChart) Due to this being a telephonic visit, the after visit summary with patients personalized plan was offered to patient via MyChart   Nurse Notes: None

## 2022-09-21 NOTE — Patient Instructions (Addendum)
Rebecca Orr , Thank you for taking time to come for your Medicare Wellness Visit. I appreciate your ongoing commitment to your health goals. Please review the following plan we discussed and let me know if I can assist you in the future.   These are the goals we discussed:  Goals       Increase physical activity (pt-stated)        This is a list of the screening recommended for you and due dates:  Health Maintenance  Topic Date Due   DTaP/Tdap/Td vaccine (1 - Tdap) Never done   Flu Shot  09/30/2022   Medicare Annual Wellness Visit  09/21/2023   Mammogram  05/04/2024   DEXA scan (bone density measurement)  Completed   HPV Vaccine  Aged Out   Pneumonia Vaccine  Discontinued   COVID-19 Vaccine  Discontinued   Zoster (Shingles) Vaccine  Discontinued    Advanced directives: In chart  Conditions/risks identified: None  Next appointment: Follow up in one year for your annual wellness visit    Preventive Care 65 Years and Older, Female Preventive care refers to lifestyle choices and visits with your health care provider that can promote health and wellness. What does preventive care include? A yearly physical exam. This is also called an annual well check. Dental exams once or twice a year. Routine eye exams. Ask your health care provider how often you should have your eyes checked. Personal lifestyle choices, including: Daily care of your teeth and gums. Regular physical activity. Eating a healthy diet. Avoiding tobacco and drug use. Limiting alcohol use. Practicing safe sex. Taking low-dose aspirin every day. Taking vitamin and mineral supplements as recommended by your health care provider. What happens during an annual well check? The services and screenings done by your health care provider during your annual well check will depend on your age, overall health, lifestyle risk factors, and family history of disease. Counseling  Your health care provider may ask you questions  about your: Alcohol use. Tobacco use. Drug use. Emotional well-being. Home and relationship well-being. Sexual activity. Eating habits. History of falls. Memory and ability to understand (cognition). Work and work Astronomer. Reproductive health. Screening  You may have the following tests or measurements: Height, weight, and BMI. Blood pressure. Lipid and cholesterol levels. These may be checked every 5 years, or more frequently if you are over 77 years old. Skin check. Lung cancer screening. You may have this screening every year starting at age 90 if you have a 30-pack-year history of smoking and currently smoke or have quit within the past 15 years. Fecal occult blood test (FOBT) of the stool. You may have this test every year starting at age 52. Flexible sigmoidoscopy or colonoscopy. You may have a sigmoidoscopy every 5 years or a colonoscopy every 10 years starting at age 53. Hepatitis C blood test. Hepatitis B blood test. Sexually transmitted disease (STD) testing. Diabetes screening. This is done by checking your blood sugar (glucose) after you have not eaten for a while (fasting). You may have this done every 1-3 years. Bone density scan. This is done to screen for osteoporosis. You may have this done starting at age 78. Mammogram. This may be done every 1-2 years. Talk to your health care provider about how often you should have regular mammograms. Talk with your health care provider about your test results, treatment options, and if necessary, the need for more tests. Vaccines  Your health care provider may recommend certain vaccines, such as:  Influenza vaccine. This is recommended every year. Tetanus, diphtheria, and acellular pertussis (Tdap, Td) vaccine. You may need a Td booster every 10 years. Zoster vaccine. You may need this after age 77. Pneumococcal 13-valent conjugate (PCV13) vaccine. One dose is recommended after age 3. Pneumococcal polysaccharide (PPSV23)  vaccine. One dose is recommended after age 14. Talk to your health care provider about which screenings and vaccines you need and how often you need them. This information is not intended to replace advice given to you by your health care provider. Make sure you discuss any questions you have with your health care provider. Document Released: 03/14/2015 Document Revised: 11/05/2015 Document Reviewed: 12/17/2014 Elsevier Interactive Patient Education  2017 [employer].  Fall Prevention in the Home Falls can cause injuries. They can happen to people of all ages. There are many things you can do to make your home safe and to help prevent falls. What can I do on the outside of my home? Regularly fix the edges of walkways and driveways and fix any cracks. Remove anything that might make you trip as you walk through a door, such as a raised step or threshold. Trim any bushes or trees on the path to your home. Use bright outdoor lighting. Clear any walking paths of anything that might make someone trip, such as rocks or tools. Regularly check to see if handrails are loose or broken. Make sure that both sides of any steps have handrails. Any raised decks and porches should have guardrails on the edges. Have any leaves, snow, or ice cleared regularly. Use sand or salt on walking paths during winter. Clean up any spills in your garage right away. This includes oil or grease spills. What can I do in the bathroom? Use night lights. Install grab bars by the toilet and in the tub and shower. Do not use towel bars as grab bars. Use non-skid mats or decals in the tub or shower. If you need to sit down in the shower, use a plastic, non-slip stool. Keep the floor dry. Clean up any water that spills on the floor as soon as it happens. Remove soap buildup in the tub or shower regularly. Attach bath mats securely with double-sided non-slip rug tape. Do not have throw rugs and other things on the floor that  can make you trip. What can I do in the bedroom? Use night lights. Make sure that you have a light by your bed that is easy to reach. Do not use any sheets or blankets that are too big for your bed. They should not hang down onto the floor. Have a firm chair that has side arms. You can use this for support while you get dressed. Do not have throw rugs and other things on the floor that can make you trip. What can I do in the kitchen? Clean up any spills right away. Avoid walking on wet floors. Keep items that you use a lot in easy-to-reach places. If you need to reach something above you, use a strong step stool that has a grab bar. Keep electrical cords out of the way. Do not use floor polish or wax that makes floors slippery. If you must use wax, use non-skid floor wax. Do not have throw rugs and other things on the floor that can make you trip. What can I do with my stairs? Do not leave any items on the stairs. Make sure that there are handrails on both sides of the stairs and use them.  Fix handrails that are broken or loose. Make sure that handrails are as long as the stairways. Check any carpeting to make sure that it is firmly attached to the stairs. Fix any carpet that is loose or worn. Avoid having throw rugs at the top or bottom of the stairs. If you do have throw rugs, attach them to the floor with carpet tape. Make sure that you have a light switch at the top of the stairs and the bottom of the stairs. If you do not have them, ask someone to add them for you. What else can I do to help prevent falls? Wear shoes that: Do not have high heels. Have rubber bottoms. Are comfortable and fit you well. Are closed at the toe. Do not wear sandals. If you use a stepladder: Make sure that it is fully opened. Do not climb a closed stepladder. Make sure that both sides of the stepladder are locked into place. Ask someone to hold it for you, if possible. Clearly mark and make sure that you  can see: Any grab bars or handrails. First and last steps. Where the edge of each step is. Use tools that help you move around (mobility aids) if they are needed. These include: Canes. Walkers. Scooters. Crutches. Turn on the lights when you go into a dark area. Replace any light bulbs as soon as they burn out. Set up your furniture so you have a clear path. Avoid moving your furniture around. If any of your floors are uneven, fix them. If there are any pets around you, be aware of where they are. Review your medicines with your doctor. Some medicines can make you feel dizzy. This can increase your chance of falling. Ask your doctor what other things that you can do to help prevent falls. This information is not intended to replace advice given to you by your health care provider. Make sure you discuss any questions you have with your health care provider. Document Released: 12/12/2008 Document Revised: 07/24/2015 Document Reviewed: 03/22/2014 Elsevier Interactive Patient Education  2017 [employer].

## 2022-09-24 ENCOUNTER — Encounter: Payer: Self-pay | Admitting: Hematology and Oncology

## 2022-09-24 NOTE — Addendum Note (Signed)
Addended by: Domenic Schwab on: 09/24/2022 05:45 PM   Modules accepted: Orders

## 2022-09-27 ENCOUNTER — Inpatient Hospital Stay: Payer: Medicare HMO | Attending: Oncology

## 2022-09-27 VITALS — BP 134/71 | HR 74 | Temp 98.2°F | Resp 14 | Ht 67.0 in | Wt 168.1 lb

## 2022-09-27 DIAGNOSIS — E538 Deficiency of other specified B group vitamins: Secondary | ICD-10-CM | POA: Insufficient documentation

## 2022-09-27 MED ORDER — CYANOCOBALAMIN 1000 MCG/ML IJ SOLN
1000.0000 ug | Freq: Once | INTRAMUSCULAR | Status: AC
  Administered 2022-09-27: 1000 ug via INTRAMUSCULAR
  Filled 2022-09-27: qty 1

## 2022-10-05 ENCOUNTER — Ambulatory Visit: Payer: Medicare HMO | Admitting: Podiatry

## 2022-10-12 ENCOUNTER — Ambulatory Visit: Payer: Medicare HMO | Admitting: Podiatry

## 2022-10-12 DIAGNOSIS — B351 Tinea unguium: Secondary | ICD-10-CM

## 2022-10-12 DIAGNOSIS — M79674 Pain in right toe(s): Secondary | ICD-10-CM | POA: Diagnosis not present

## 2022-10-12 DIAGNOSIS — M79675 Pain in left toe(s): Secondary | ICD-10-CM | POA: Diagnosis not present

## 2022-10-12 NOTE — Progress Notes (Signed)
  Subjective:  Patient ID: Rebecca Orr, female    DOB: [birth year],  MRN: [id number]  Chief Complaint  Patient presents with   Nail Problem    Routine Foot Care-nail trim    Peripheral Neuropathy    C/o burning and tingling sensation to bilateral hallux. She was taking Lyrica for about 2 years.     82 y.o. female presents with the above complaint. History confirmed with patient. Patient presenting with pain related to dystrophic thickened elongated nails. Patient is unable to trim own nails related to nail dystrophy and/or mobility issues. Patient does not have a history of T2DM.  Patient previously had ingrown nail removed on June 22, 2022 she says that it is red and burning sometimes but has been improving.  She has been using ciclopirox topical antifungal medication since May 8  Objective:  Physical Exam: warm, good capillary refill nail exam onychomycosis of the toenails, onycholysis, and dystrophic nails DP pulses palpable, PT pulses palpable, and protective sensation intact Left Foot:  Pain with palpation of nails due to elongation and dystrophic growth.  Right Foot: Pain with palpation of nails due to elongation and dystrophic growth.   Assessment:   1. Pain due to onychomycosis of toenails of both feet     Plan:  Patient was evaluated and treated and all questions answered.   #Onychomycosis with pain  -Nails palliatively debrided as below. -Educated on self-care -Continue with ciclopirox 8% topical antifungal solution apply to all nails daily for the next 3 months  Procedure: Nail Debridement Rationale: Pain Type of Debridement: manual, sharp debridement. Instrumentation: Nail nipper, rotary burr. Number of Nails: 10  Return in about 3 months (around 01/12/2023) for RFC.         Corinna Gab, DPM [facility] / [facility]

## 2022-10-28 ENCOUNTER — Inpatient Hospital Stay: Payer: Medicare HMO | Attending: Oncology

## 2022-10-28 VITALS — BP 139/66 | HR 72 | Temp 97.9°F | Resp 18 | Ht 67.0 in | Wt 171.0 lb

## 2022-10-28 DIAGNOSIS — E538 Deficiency of other specified B group vitamins: Secondary | ICD-10-CM | POA: Insufficient documentation

## 2022-10-28 MED ORDER — CYANOCOBALAMIN 1000 MCG/ML IJ SOLN
1000.0000 ug | Freq: Once | INTRAMUSCULAR | Status: AC
  Administered 2022-10-28: 1000 ug via INTRAMUSCULAR
  Filled 2022-10-28: qty 1

## 2022-10-28 NOTE — Patient Instructions (Signed)
 Vitamin B12 Injection What is this medication? Vitamin B12 (VAHY tuh min B12) prevents and treats low vitamin B12 levels in your body. It is used in people who do not get enough vitamin B12 from their diet or when their digestive tract does not absorb enough. Vitamin B12 plays an important role in maintaining the health of your nervous system and red blood cells. This medicine may be used for other purposes; ask your health care provider or pharmacist if you have questions. COMMON BRAND NAME(S): B-12 Compliance Kit, B-12 Injection Kit, Cyomin, Dodex, LA-12, Nutri-Twelve, Physicians EZ Use B-12, Primabalt, Vitamin Deficiency Injectable System - B12 What should I tell my care team before I take this medication? They need to know if you have any of these conditions: Kidney disease Leber's disease Megaloblastic anemia An unusual or allergic reaction to cyanocobalamin, cobalt, other medications, foods, dyes, or preservatives Pregnant or trying to get pregnant Breast-feeding How should I use this medication? This medication is injected into a muscle or deeply under the skin. It is usually given in a clinic or care team's office. However, your care team may teach you how to inject yourself. Follow all instructions. Talk to your care team about the use of this medication in children. Special care may be needed. Overdosage: If you think you have taken too much of this medicine contact a poison control center or emergency room at once. NOTE: This medicine is only for you. Do not share this medicine with others. What if I miss a dose? If you are given your dose at a clinic or care team's office, call to reschedule your appointment. If you give your own injections, and you miss a dose, take it as soon as you can. If it is almost time for your next dose, take only that dose. Do not take double or extra doses. What may interact with this medication? Alcohol Colchicine This list may not describe all possible  interactions. Give your health care provider a list of all the medicines, herbs, non-prescription drugs, or dietary supplements you use. Also tell them if you smoke, drink alcohol, or use illegal drugs. Some items may interact with your medicine. What should I watch for while using this medication? Visit your care team regularly. You may need blood work done while you are taking this medication. You may need to follow a special diet. Talk to your care team. Limit your alcohol intake and avoid smoking to get the best benefit. What side effects may I notice from receiving this medication? Side effects that you should report to your care team as soon as possible: Allergic reactions--skin rash, itching, hives, swelling of the face, lips, tongue, or throat Swelling of the ankles, hands, or feet Trouble breathing Side effects that usually do not require medical attention (report to your care team if they continue or are bothersome): Diarrhea This list may not describe all possible side effects. Call your doctor for medical advice about side effects. You may report side effects to FDA at 1-800-FDA-1088. Where should I keep my medication? Keep out of the reach of children. Store at room temperature between 15 and 30 degrees C (59 and 85 degrees F). Protect from light. Throw away any unused medication after the expiration date. NOTE: This sheet is a summary. It may not cover all possible information. If you have questions about this medicine, talk to your doctor, pharmacist, or health care provider.  2024 Elsevier/Gold Standard (2020-10-28 00:00:00)

## 2022-11-15 ENCOUNTER — Other Ambulatory Visit: Payer: Self-pay | Admitting: Podiatry

## 2022-11-26 ENCOUNTER — Encounter: Payer: Self-pay | Admitting: Hematology and Oncology

## 2022-11-26 NOTE — Addendum Note (Signed)
Addended by: Domenic Schwab on: 11/26/2022 02:51 PM   Modules accepted: Orders

## 2022-11-29 ENCOUNTER — Inpatient Hospital Stay: Payer: Medicare HMO | Attending: Oncology

## 2022-11-29 VITALS — BP 139/63 | HR 70 | Temp 97.7°F | Resp 18 | Wt 169.0 lb

## 2022-11-29 DIAGNOSIS — E538 Deficiency of other specified B group vitamins: Secondary | ICD-10-CM | POA: Insufficient documentation

## 2022-11-29 MED ORDER — CYANOCOBALAMIN 1000 MCG/ML IJ SOLN
1000.0000 ug | Freq: Once | INTRAMUSCULAR | Status: AC
  Administered 2022-11-29: 1000 ug via INTRAMUSCULAR
  Filled 2022-11-29: qty 1

## 2022-11-29 NOTE — Progress Notes (Signed)
Patient declines flu vaccine. 

## 2022-11-29 NOTE — Patient Instructions (Signed)
 Vitamin B12 Injection What is this medication? Vitamin B12 (VAHY tuh min B12) prevents and treats low vitamin B12 levels in your body. It is used in people who do not get enough vitamin B12 from their diet or when their digestive tract does not absorb enough. Vitamin B12 plays an important role in maintaining the health of your nervous system and red blood cells. This medicine may be used for other purposes; ask your health care provider or pharmacist if you have questions. COMMON BRAND NAME(S): B-12 Compliance Kit, B-12 Injection Kit, Cyomin, Dodex, LA-12, Nutri-Twelve, Physicians EZ Use B-12, Primabalt, Vitamin Deficiency Injectable System - B12 What should I tell my care team before I take this medication? They need to know if you have any of these conditions: Kidney disease Leber's disease Megaloblastic anemia An unusual or allergic reaction to cyanocobalamin, cobalt, other medications, foods, dyes, or preservatives Pregnant or trying to get pregnant Breast-feeding How should I use this medication? This medication is injected into a muscle or deeply under the skin. It is usually given in a clinic or care team's office. However, your care team may teach you how to inject yourself. Follow all instructions. Talk to your care team about the use of this medication in children. Special care may be needed. Overdosage: If you think you have taken too much of this medicine contact a poison control center or emergency room at once. NOTE: This medicine is only for you. Do not share this medicine with others. What if I miss a dose? If you are given your dose at a clinic or care team's office, call to reschedule your appointment. If you give your own injections, and you miss a dose, take it as soon as you can. If it is almost time for your next dose, take only that dose. Do not take double or extra doses. What may interact with this medication? Alcohol Colchicine This list may not describe all possible  interactions. Give your health care provider a list of all the medicines, herbs, non-prescription drugs, or dietary supplements you use. Also tell them if you smoke, drink alcohol, or use illegal drugs. Some items may interact with your medicine. What should I watch for while using this medication? Visit your care team regularly. You may need blood work done while you are taking this medication. You may need to follow a special diet. Talk to your care team. Limit your alcohol intake and avoid smoking to get the best benefit. What side effects may I notice from receiving this medication? Side effects that you should report to your care team as soon as possible: Allergic reactions--skin rash, itching, hives, swelling of the face, lips, tongue, or throat Swelling of the ankles, hands, or feet Trouble breathing Side effects that usually do not require medical attention (report to your care team if they continue or are bothersome): Diarrhea This list may not describe all possible side effects. Call your doctor for medical advice about side effects. You may report side effects to FDA at 1-800-FDA-1088. Where should I keep my medication? Keep out of the reach of children. Store at room temperature between 15 and 30 degrees C (59 and 85 degrees F). Protect from light. Throw away any unused medication after the expiration date. NOTE: This sheet is a summary. It may not cover all possible information. If you have questions about this medicine, talk to your doctor, pharmacist, or health care provider.  2024 Elsevier/Gold Standard (2020-10-28 00:00:00)

## 2022-12-01 ENCOUNTER — Encounter: Payer: Self-pay | Admitting: Cardiology

## 2022-12-01 ENCOUNTER — Ambulatory Visit: Payer: Medicare HMO | Attending: Cardiology | Admitting: Cardiology

## 2022-12-01 VITALS — BP 124/68 | HR 123 | Ht 67.0 in | Wt 169.6 lb

## 2022-12-01 DIAGNOSIS — I700 Atherosclerosis of aorta: Secondary | ICD-10-CM | POA: Diagnosis not present

## 2022-12-01 DIAGNOSIS — I4729 Other ventricular tachycardia: Secondary | ICD-10-CM

## 2022-12-01 DIAGNOSIS — I2510 Atherosclerotic heart disease of native coronary artery without angina pectoris: Secondary | ICD-10-CM | POA: Insufficient documentation

## 2022-12-01 DIAGNOSIS — E782 Mixed hyperlipidemia: Secondary | ICD-10-CM | POA: Diagnosis not present

## 2022-12-01 HISTORY — DX: Atherosclerotic heart disease of native coronary artery without angina pectoris: I25.10

## 2022-12-01 NOTE — Progress Notes (Signed)
Cardiology Office Note:    Date:  12/01/2022   ID:  Rebecca Orr, DOB [birth year], MRN [id number]  PCP:  Blane Ohara, MD  Cardiologist:  Garwin Brothers, MD   Referring MD: Blane Ohara, MD    ASSESSMENT:    1. NSVT (nonsustained ventricular tachycardia) (HCC)   2. Atherosclerosis of native coronary artery of native heart without angina pectoris   3. Abdominal aortic atherosclerosis (HCC)   4. Mixed hyperlipidemia    PLAN:    In order of problems listed above:  Coronary artery calcification and aortic atherosclerosis: Secondary prevention stressed with the patient.  Importance of compliance with diet medication stressed and she vocalized understanding.  She was advised to walk at least half an hour a day on a daily basis and she agrees to do so. Mixed dyslipidemia: On lipid-lowering medications followed by primary care.  Goal LDL must be less than 60.  She is vehemently against lipid-lowering medications.  Benefits risks explained and she vocalized understanding and questions were answered to her satisfaction. Patient will be seen in follow-up appointment in 9 months or earlier if the patient has any concerns.    Medication Adjustments/Labs and Tests Ordered: Current medicines are reviewed at length with the patient today.  Concerns regarding medicines are outlined above.  Orders Placed This Encounter  Procedures   EKG 12-Lead   No orders of the defined types were placed in this encounter.    No chief complaint on file.    History of Present Illness:    Rebecca Orr is a 82 y.o. female.  Patient has past medical history of coronary and aortic atherosclerosis, nonsustained ventricular tachycardia.  She denies any problems at this time and takes care of activities of daily living.  No chest pain orthopnea or PND.  She gives history of pain in her knees which has been happening for the past several months to years.  For this reason she tells  me that she has not started statin therapy.  At the time of my evaluation, the patient is alert awake oriented and in no distress.  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   Ingrown right big toenail 05/23/2022   Irregular heart beats 06/28/2021   Mixed hyperlipidemia 01/01/2021   NSVT (nonsustained ventricular tachycardia) (HCC) 08/07/2021   Pain of lower extremity 05/20/2020   Personal history of colon cancer, stage I 07/18/2015   Transient ischemic attack    Weakness 06/28/2021    Past Surgical History:  Procedure Laterality Date   CATARACT EXTRACTION     CHOLECYSTECTOMY     HEMICOLECTOMY  2017   SPHINCTEROTOMY  2011    Current Medications: Current Meds  Medication Sig   ciclopirox (PENLAC) 8 % solution APPLY EXTERNALLY OVER NAIL AND SURROUNDING SKIN AT BEDTIME COVER PREVIOUS COAT, AFTER 7 DAYS MAY REMOVE WITH ALCOHOL AND CONTINUE CYCLE   Cyanocobalamin (B-12 IJ) Inject as directed every 30 (thirty) days.   famotidine (PEPCID) 40 MG tablet TAKE 1 TABLET(40 MG) BY  MOUTH TWICE DAILY (Patient taking differently: Take 40 mg by mouth daily. TAKE 1 TABLET(40 MG) BY MOUTH DAILY)   fluticasone (FLONASE) 50 MCG/ACT nasal spray Place 2 sprays into both nostrils daily.   metoprolol succinate (TOPROL XL) 25 MG 24 hr tablet Take 0.5 tablets (12.5 mg total) by mouth daily.   Current Facility-Administered Medications for the 12/01/22 encounter (Office  Visit) with Mancil Pfenning, Aundra Dubin, MD  Medication   triamcinolone acetonide (KENALOG-40) injection 80 mg     Allergies:   Nsaids, Aspirin, Atenolol, Naproxen, Propranolol, and Tizanidine   Social History   Socioeconomic History   Marital status: Widowed    Spouse name: Not on file   Number of children: Not on file   Years of education: Not on file   Highest education level: Not on file  Occupational History   Not on file  Tobacco Use   Smoking status: Never   Smokeless tobacco: Never  Vaping Use   Vaping status: Never Used  Substance and Sexual Activity   Alcohol use: Never   Drug use: Never   Sexual activity: Not on file  Other Topics Concern   Not on file  Social History Narrative   Not on file   Social Determinants of Health   Financial Resource Strain: Low Risk  (09/21/2022)   Overall Financial Resource Strain (CARDIA)    Difficulty of Paying Living Expenses: Not hard at all  Food Insecurity: No Food Insecurity (09/21/2022)   Hunger Vital Sign    Worried About Running Out of Food in the Last Year: Never true    Ran Out of Food in the Last Year: Never true  Transportation Needs: No Transportation Needs (09/21/2022)   PRAPARE - Administrator, Civil Service (Medical): No    Lack of Transportation (Non-Medical): No  Physical Activity: Insufficiently Active (09/21/2022)   Exercise Vital Sign    Days of Exercise per Week: 3 days    Minutes of Exercise per Session: 30 min  Stress: No Stress Concern Present (09/21/2022)   [employer] of Occupational Health - Occupational Stress Questionnaire    Feeling of Stress : Not at all  Social Connections: Moderately Integrated (09/21/2022)   Social Connection and Isolation Panel [NHANES]    Frequency of Communication with Friends and Family: More than three times a week    Frequency of Social Gatherings with Friends and Family: More than three times a week    Attends Religious Services: More than 4 times per year     Active Member of [employer] or Organizations: Yes    Attends Banker Meetings: More than 4 times per year    Marital Status: Widowed     Family History: The patient's family history includes Breast cancer in her paternal grandmother; Dementia in her mother; Heart disease in her father; Lung cancer in her father; Prostate cancer in her brother; Renal cancer in her sister.  ROS:   Please see the history of present illness.    All other systems reviewed and are negative.  EKGs/Labs/Other Studies Reviewed:   Marland KitchenMarland KitchenEKG Interpretation Date/Time:  Wednesday December 01 2022 15:04:21 EDT Ventricular Rate:  123 PR Interval:  140 QRS Duration:  70 QT Interval:  348 QTC Calculation: 498 R Axis:   -18  Text Interpretation: Sinus rhythm with frequent Premature ventricular complexes Inferior infarct , age undetermined Anterior infarct , age undetermined Abnormal ECG No previous ECGs available Confirmed by Belva Crome [phone]) on 12/01/2022 3:09:01 PM  Recent Labs: 05/21/2022: ALT 13; BUN 22; Creatinine, Ser 1.40; Hemoglobin 11.4; Platelets 167; Potassium 4.8; Sodium 145  Recent Lipid Panel    Component Value Date/Time   CHOL 196 05/21/2022 1124   TRIG 97 05/21/2022 1124   HDL 70 05/21/2022 1124   CHOLHDL 2.8 05/21/2022 1124   LDLCALC 109 (H) 05/21/2022 1124    Physical Exam:    VS:  BP (!) 144/76   Pulse (!) 123   Ht 5\' 7"  (1.702 m)   Wt 169 lb 9.6 oz (76.9 kg)   SpO2 97%   BMI 26.56 kg/m     Wt Readings from Last 3 Encounters:  12/01/22 169 lb 9.6 oz (76.9 kg)  11/29/22 169 lb (76.7 kg)  10/28/22 171 lb 0.6 oz (77.6 kg)     GEN: Patient is in no acute distress HEENT: Normal NECK: No JVD; No carotid bruits LYMPHATICS: No lymphadenopathy CARDIAC: Hear sounds regular, 2/6 systolic murmur at the apex. RESPIRATORY:  Clear to auscultation without rales, wheezing or rhonchi  ABDOMEN: Soft, non-tender, non-distended MUSCULOSKELETAL:  No edema; No deformity  SKIN: Warm  and dry NEUROLOGIC:  Alert and oriented x 3 PSYCHIATRIC:  Normal affect   Signed, Garwin Brothers, MD  12/01/2022 3:11 PM    [facility]

## 2022-12-01 NOTE — Patient Instructions (Signed)

## 2022-12-21 ENCOUNTER — Ambulatory Visit: Payer: Medicare HMO | Admitting: Podiatry

## 2022-12-21 ENCOUNTER — Encounter: Payer: Self-pay | Admitting: Podiatry

## 2022-12-21 DIAGNOSIS — M79675 Pain in left toe(s): Secondary | ICD-10-CM | POA: Diagnosis not present

## 2022-12-21 DIAGNOSIS — M79674 Pain in right toe(s): Secondary | ICD-10-CM

## 2022-12-21 DIAGNOSIS — B351 Tinea unguium: Secondary | ICD-10-CM

## 2022-12-21 NOTE — Progress Notes (Signed)
Subjective:  Patient ID: Rebecca Orr, female    DOB: [birth year],  MRN: [id number]  Chief Complaint  Patient presents with   RFC    Painful mycotic toenails. Non-dm. Nueropathy pain    82 y.o. female presents with the above complaint. History confirmed with patient. Patient presenting with pain related to dystrophic thickened elongated nails. Patient is unable to trim own nails related to nail dystrophy and/or mobility issues. Patient does not have a history of T2DM.  Patient previously had ingrown nail removed on June 22, 2022 she says that it is red and burning sometimes but has been improving.  She has been using ciclopirox topical antifungal medication since May 8  Objective:  Physical Exam: warm, good capillary refill nail exam onychomycosis of the toenails, onycholysis, and dystrophic nails DP pulses palpable, PT pulses palpable, and protective sensation intact Left Foot:  Pain with palpation of nails due to elongation and dystrophic growth.  Right Foot: Pain with palpation of nails due to elongation and dystrophic growth.   Assessment:   1. Pain due to onychomycosis of toenails of both feet      Plan:  Patient was evaluated and treated and all questions answered.   #Onychomycosis with pain  -Nails palliatively debrided as below. -Educated on self-care -Continue with ciclopirox 8% topical antifungal solution apply to all nails daily for the next 3 months  Procedure: Nail Debridement Rationale: Pain Type of Debridement: manual, sharp debridement. Instrumentation: Nail nipper, rotary burr. Number of Nails: 10  Return in about 3 months (around 03/23/2023) for RFC.         Corinna Gab, DPM [facility] / [facility]

## 2022-12-27 ENCOUNTER — Inpatient Hospital Stay: Payer: Medicare HMO | Attending: Oncology

## 2022-12-27 VITALS — BP 131/59 | HR 69 | Temp 97.9°F | Resp 18 | Ht 67.0 in | Wt 169.0 lb

## 2022-12-27 DIAGNOSIS — E538 Deficiency of other specified B group vitamins: Secondary | ICD-10-CM | POA: Insufficient documentation

## 2022-12-27 MED ORDER — CYANOCOBALAMIN 1000 MCG/ML IJ SOLN
1000.0000 ug | Freq: Once | INTRAMUSCULAR | Status: AC
  Administered 2022-12-27: 1000 ug via INTRAMUSCULAR
  Filled 2022-12-27: qty 1

## 2022-12-27 NOTE — Patient Instructions (Signed)
 Vitamin B12 Injection What is this medication? Vitamin B12 (VAHY tuh min B12) prevents and treats low vitamin B12 levels in your body. It is used in people who do not get enough vitamin B12 from their diet or when their digestive tract does not absorb enough. Vitamin B12 plays an important role in maintaining the health of your nervous system and red blood cells. This medicine may be used for other purposes; ask your health care provider or pharmacist if you have questions. COMMON BRAND NAME(S): B-12 Compliance Kit, B-12 Injection Kit, Cyomin, Dodex, LA-12, Nutri-Twelve, Physicians EZ Use B-12, Primabalt, Vitamin Deficiency Injectable System - B12 What should I tell my care team before I take this medication? They need to know if you have any of these conditions: Kidney disease Leber's disease Megaloblastic anemia An unusual or allergic reaction to cyanocobalamin, cobalt, other medications, foods, dyes, or preservatives Pregnant or trying to get pregnant Breast-feeding How should I use this medication? This medication is injected into a muscle or deeply under the skin. It is usually given in a clinic or care team's office. However, your care team may teach you how to inject yourself. Follow all instructions. Talk to your care team about the use of this medication in children. Special care may be needed. Overdosage: If you think you have taken too much of this medicine contact a poison control center or emergency room at once. NOTE: This medicine is only for you. Do not share this medicine with others. What if I miss a dose? If you are given your dose at a clinic or care team's office, call to reschedule your appointment. If you give your own injections, and you miss a dose, take it as soon as you can. If it is almost time for your next dose, take only that dose. Do not take double or extra doses. What may interact with this medication? Alcohol Colchicine This list may not describe all possible  interactions. Give your health care provider a list of all the medicines, herbs, non-prescription drugs, or dietary supplements you use. Also tell them if you smoke, drink alcohol, or use illegal drugs. Some items may interact with your medicine. What should I watch for while using this medication? Visit your care team regularly. You may need blood work done while you are taking this medication. You may need to follow a special diet. Talk to your care team. Limit your alcohol intake and avoid smoking to get the best benefit. What side effects may I notice from receiving this medication? Side effects that you should report to your care team as soon as possible: Allergic reactions--skin rash, itching, hives, swelling of the face, lips, tongue, or throat Swelling of the ankles, hands, or feet Trouble breathing Side effects that usually do not require medical attention (report to your care team if they continue or are bothersome): Diarrhea This list may not describe all possible side effects. Call your doctor for medical advice about side effects. You may report side effects to FDA at 1-800-FDA-1088. Where should I keep my medication? Keep out of the reach of children. Store at room temperature between 15 and 30 degrees C (59 and 85 degrees F). Protect from light. Throw away any unused medication after the expiration date. NOTE: This sheet is a summary. It may not cover all possible information. If you have questions about this medicine, talk to your doctor, pharmacist, or health care provider.  2024 Elsevier/Gold Standard (2020-10-28 00:00:00)

## 2023-01-12 ENCOUNTER — Other Ambulatory Visit: Payer: Medicare HMO

## 2023-01-12 ENCOUNTER — Ambulatory Visit: Payer: Medicare HMO | Admitting: Oncology

## 2023-01-21 ENCOUNTER — Other Ambulatory Visit: Payer: Self-pay | Admitting: Hematology and Oncology

## 2023-01-21 DIAGNOSIS — Z85038 Personal history of other malignant neoplasm of large intestine: Secondary | ICD-10-CM

## 2023-01-21 DIAGNOSIS — E538 Deficiency of other specified B group vitamins: Secondary | ICD-10-CM

## 2023-01-24 ENCOUNTER — Inpatient Hospital Stay: Payer: Medicare HMO

## 2023-01-24 ENCOUNTER — Ambulatory Visit: Payer: Medicare HMO

## 2023-01-24 ENCOUNTER — Inpatient Hospital Stay: Payer: Medicare HMO | Attending: Oncology

## 2023-01-24 ENCOUNTER — Encounter: Payer: Self-pay | Admitting: Hematology and Oncology

## 2023-01-24 ENCOUNTER — Telehealth: Payer: Self-pay | Admitting: Hematology and Oncology

## 2023-01-24 ENCOUNTER — Inpatient Hospital Stay: Payer: Medicare HMO | Admitting: Hematology and Oncology

## 2023-01-24 VITALS — BP 150/71 | HR 64 | Temp 97.6°F | Resp 20 | Ht 67.0 in | Wt 172.4 lb

## 2023-01-24 DIAGNOSIS — C184 Malignant neoplasm of transverse colon: Secondary | ICD-10-CM

## 2023-01-24 DIAGNOSIS — E538 Deficiency of other specified B group vitamins: Secondary | ICD-10-CM

## 2023-01-24 DIAGNOSIS — D649 Anemia, unspecified: Secondary | ICD-10-CM | POA: Insufficient documentation

## 2023-01-24 DIAGNOSIS — R970 Elevated carcinoembryonic antigen [CEA]: Secondary | ICD-10-CM | POA: Diagnosis not present

## 2023-01-24 DIAGNOSIS — Z85038 Personal history of other malignant neoplasm of large intestine: Secondary | ICD-10-CM

## 2023-01-24 DIAGNOSIS — D539 Nutritional anemia, unspecified: Secondary | ICD-10-CM | POA: Diagnosis not present

## 2023-01-24 LAB — CMP (CANCER CENTER ONLY)
ALT: 11 U/L (ref 0–44)
AST: 25 U/L (ref 15–41)
Albumin: 4.1 g/dL (ref 3.5–5.0)
Alkaline Phosphatase: 66 U/L (ref 38–126)
Anion gap: 9 (ref 5–15)
BUN: 23 mg/dL (ref 8–23)
CO2: 25 mmol/L (ref 22–32)
Calcium: 9.9 mg/dL (ref 8.9–10.3)
Chloride: 108 mmol/L (ref 98–111)
Creatinine: 1.59 mg/dL — ABNORMAL HIGH (ref 0.44–1.00)
GFR, Estimated: 32 mL/min — ABNORMAL LOW (ref >60)
Glucose, Bld: 102 mg/dL — ABNORMAL HIGH (ref 70–99)
Potassium: 4.2 mmol/L (ref 3.5–5.1)
Sodium: 142 mmol/L (ref 135–145)
Total Bilirubin: 0.4 mg/dL (ref <1.2)
Total Protein: 6.7 g/dL (ref 6.5–8.1)

## 2023-01-24 LAB — IRON AND TIBC
Iron: 92 ug/dL (ref 28–170)
Saturation Ratios: 22 % (ref 10.4–31.8)
TIBC: 417 ug/dL (ref 250–450)
UIBC: 325 ug/dL

## 2023-01-24 LAB — CBC WITH DIFFERENTIAL (CANCER CENTER ONLY)
Abs Immature Granulocytes: 0.02 10*3/uL (ref 0.00–0.07)
Basophils Absolute: 0 10*3/uL (ref 0.0–0.1)
Basophils Relative: 1 %
Eosinophils Absolute: 0.4 10*3/uL (ref 0.0–0.5)
Eosinophils Relative: 9 %
HCT: 32.6 % — ABNORMAL LOW (ref 36.0–46.0)
Hemoglobin: 10.6 g/dL — ABNORMAL LOW (ref 12.0–15.0)
Immature Granulocytes: 0 %
Lymphocytes Relative: 25 %
Lymphs Abs: 1.2 10*3/uL (ref 0.7–4.0)
MCH: 30 pg (ref 26.0–34.0)
MCHC: 32.5 g/dL (ref 30.0–36.0)
MCV: 92.4 fL (ref 80.0–100.0)
Monocytes Absolute: 0.5 10*3/uL (ref 0.1–1.0)
Monocytes Relative: 10 %
Neutro Abs: 2.6 10*3/uL (ref 1.7–7.7)
Neutrophils Relative %: 55 %
Platelet Count: 156 10*3/uL (ref 150–400)
RBC: 3.53 MIL/uL — ABNORMAL LOW (ref 3.87–5.11)
RDW: 13.5 % (ref 11.5–15.5)
WBC Count: 4.7 10*3/uL (ref 4.0–10.5)
nRBC: 0 % (ref 0.0–0.2)
nRBC: 0 /100{WBCs}

## 2023-01-24 LAB — CEA (ACCESS): CEA (CHCC): 6.18 ng/mL — ABNORMAL HIGH (ref 0.00–5.00)

## 2023-01-24 LAB — VITAMIN B12: Vitamin B-12: 419 pg/mL (ref 180–914)

## 2023-01-24 LAB — FERRITIN: Ferritin: 32 ng/mL (ref 11–307)

## 2023-01-24 LAB — FOLATE: Folate: 13.9 ng/mL (ref >5.9)

## 2023-01-24 MED ORDER — CYANOCOBALAMIN 1000 MCG/ML IJ SOLN
1000.0000 ug | Freq: Once | INTRAMUSCULAR | Status: AC
  Administered 2023-01-24: 1000 ug via INTRAMUSCULAR
  Filled 2023-01-24: qty 1

## 2023-01-24 NOTE — Assessment & Plan Note (Addendum)
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  She was treated with surgical resection with findings of a 3.5 cm adenocarcinoma with 12 negative nodes.  She underwent cholecystectomy at that time due to chronic cholecystitis.  She has had intermittent elevation of the CEA of uncertain etiology.  Repeat imaging over the years has never revealed any evidence of recurrence.  Colonoscopy in January 2021 did not reveal any evidence of malignancy.  Consideration of repeat colonoscopy in 5 years was recommended.  She remains without evidence of recurrence.  We will plan to see her back in 1 year with a CBC, CMP and CEA.

## 2023-01-24 NOTE — Progress Notes (Cosign Needed Addendum)
[facility]  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  She was treated with surgical resection with findings of a 3.5 cm adenocarcinoma with 12 negative nodes.  She underwent cholecystectomy at that time due to chronic cholecystitis.  She has had intermittent elevation of the CEA of uncertain etiology.  Repeat imaging over the years has never revealed any evidence of recurrence.  Colonoscopy in January 2021 did not reveal any evidence of malignancy.  Consideration of repeat colonoscopy in 5 years was recommended.  She remains without evidence of recurrence.  We will plan to see her back in 1 year with a CBC, CMP and CEA.  B12 deficiency Deficiency diagnosed in October 2018.  She remains on B12 injections monthly.   The patient understands the plans discussed today and is in agreement with them.  She knows to contact our office if she develops concerns prior to her next appointment.   I provided 30 minutes of face-to-face time during this encounter and > 50% was spent counseling as documented under my assessment and plan.    Adah Perl, PA-C  [facility] - A DEPT OF [facility] [address] [place] [address] Dept: [phone] Dept Fax: [phone]   No orders of the defined types were placed in this encounter.     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 This was found during evaluation of iron deficiency.  Preoperative CT chest,  abdomen and pelvis revealed a lobular mass in the transverse colon at the hepatic flexure, a 4 mm right upper lobe pulmonary nodule, felt to likely be benign, as well as bilateral ovarian cysts and cholelithiasis with mild distention of the gallbladder. CEA was within normal limits.  The patient underwent surgical resection of her colon cancer, as well as cholecystectomy, in June 2017.  Pathology revealed a 3.5 cm, grade 1, adenocarcinoma of the transverse colon with microscopic invasion of the muscularis propria.  Twelve nodes were negative for metastasis.  Pathology of the gallbladder revealed chronic cholecystitis and cholelithiasis.  Mismatch repair testing revealed loss of nuclear expression of MLH1 and PMS2 in less than 5% of the tumor.  Microsatellite instability was high.  BRAF V600E testing was positive.  A positive BRAF is indicative of sporadic microsatellite instability, so it is unlikely that this patient has Lynch syndrome.  Her family history is positive for her brother having prostate cancer in his late 53's, sister with renal cancer in her 9's and a maternal uncle with brain cancer in his 1's.  She was unable to tell us what type of brain tumor or kidney cancer was present, or the Gleason score of her brother's prostate cancer.  There was also a paternal grandmother who had breast cancer in her 36's.  She does not meet criteria for testing for hereditary cancer syndromes due to lack of information, so we have not pursued genetic testing.  Due to the early stage of her cancer, she did not  require adjuvant chemotherapy.     She had elevation of her CEA to 5.6 in October 2017, so CT chest, abdomen and pelvis was obtained.  This did not reveal any evidence of malignancy.  The previously noted pulmonary nodule was stable.  She is a nonsmoker.  Her hemoglobin had returned to normal, so her iron supplement was discontinued in October 2017.  At her visit in December 2017, she had recurrent mild anemia  with a hemoglobin of 11.6.  She had persistent anemia at her visit in October 2018 and was found to have vitamin B12 deficiency, so she was placed on B12 injections, which she continues.   She had a colonoscopy in August 2018 by Dr. Georgiana Shore, which was negative.  She developed dizziness in December 2019.  MRI head, ENT, neurologic and cardiology evaluation did not reveal a specific etiology for her dizziness. Her CEA has fluctuated between 5.6 and 4.4, so not felt to be clinically significant.  The CEA has remained normal at 4.6 since November 2019. She underwent EGD back in November 2020, which revealed erosive duodenitis, gastritis, and a hiatal hernia.   Annual screening bilateral mammogram and bone density scan in January 2021 were normal.  She underwent colonoscopy with Dr. Jennye Boroughs in January 2021, which revealed diverticular disease right colon, and internal and external hemorrhoids, but was otherwise negative.  Consideration for repeat colonoscopy in 5 years.  Most recent CT chest, abdomen pelvis in 2023 did not reveal any evidence of malignancy.   Oncology History  Malignant neoplasm of transverse colon (HCC) (Resolved)  07/16/2015 Cancer Staging   Staging form: Colon and Rectum, AJCC 7th Edition - Clinical stage from 07/16/2015: Stage I (T2, N0, M0) - Signed by Dellia Beckwith, MD on 01/30/2022 Staged by: Managing physician Diagnostic confirmation: Positive histology Specimen type: Excision Histopathologic type: Adenocarcinoma, NOS Stage prefix: Initial diagnosis Laterality: Right Tumor size (mm): 35 Histologic grade (G): G1 Lymph-vascular invasion (LVI): LVI not present (absent)/not identified Residual tumor (R): R0 - None Tumor deposits (TD): Absent Perineural invasion (PNI): Absent Microsatellite instability (MSI): Unstable high KRAS gene analysis: Not assessed Prognostic indicators: MMR abnormal, BRAF V600E mutation positive, 12 neg nodes Stage used in treatment  planning: Yes National guidelines used in treatment planning: Yes Type of national guideline used in treatment planning: NCCN   07/22/2015 Initial Diagnosis   Malignant neoplasm of transverse colon (HCC)       INTERVAL HISTORY:  Rebecca Orr is here today for repeat clinical assessment. She reports easy fatigability and occasional shortness of breath on exertion. She continues to have episodes of dizziness. She denies fevers or chills. She reports bilateral leg cramping up to the groin. OTC medication seems to help. She reports intermittent pain and numbness in the bilateral feet.  Her appetite is good. Her weight has decreased 5 pounds over last year . Her weight was 177 pounds at our office in November 2023. She no longer takes aspirin, Aleve or other NSAIDs. She continues famotidine daily. Bilateral screening mammogram in March 2024 did not reveal any evidence of malignancy.   REVIEW OF SYSTEMS:  Review of Systems  Constitutional:  Positive for fatigue. Negative for appetite change, chills, diaphoresis, fever and unexpected weight change.  HENT:   Negative for lump/mass, mouth sores, nosebleeds and sore throat.   Respiratory:  Negative for cough, hemoptysis and shortness of breath.   Cardiovascular:  Negative for chest pain and leg swelling.  Gastrointestinal:  Negative for abdominal pain, blood in stool, constipation, diarrhea, nausea,  rectal pain and vomiting.  Endocrine: Negative for hot flashes.  Genitourinary:  Negative for difficulty urinating, dysuria, frequency, hematuria and vaginal bleeding.   Musculoskeletal:  Positive for gait problem (unsteadiness on feet, espectially barefoot) and myalgias (bilateral legs). Negative for arthralgias and back pain.  Skin:  Negative for itching and rash.  Neurological:  Positive for dizziness (motion sickness), gait problem (unsteadiness on feet, espectially barefoot) and numbness (intermittent feet). Negative for headaches.  Hematological:  Negative  for adenopathy. Does not bruise/bleed easily.  Psychiatric/Behavioral:  Negative for depression and sleep disturbance. The patient is not nervous/anxious.      VITALS:  Blood pressure (!) 150/71, pulse 64, temperature 97.6 F (36.4 C), temperature source Oral, resp. rate 20, height 5\' 7"  (1.702 m), weight 172 lb 6.4 oz (78.2 kg), SpO2 100%.  Wt Readings from Last 3 Encounters:  01/24/23 172 lb 6.4 oz (78.2 kg)  12/27/22 169 lb (76.7 kg)  12/01/22 169 lb 9.6 oz (76.9 kg)    Body mass index is 27 kg/m.  Performance status (ECOG): 1 - Symptomatic but completely ambulatory  PHYSICAL EXAM:  Physical Exam  LABS:      Latest Ref Rng & Units 01/24/2023    9:52 AM 05/21/2022   11:24 AM 01/12/2022   10:14 AM  CBC  WBC 4.0 - 10.5 K/uL 4.7  3.7  4.4   Hemoglobin 12.0 - 15.0 g/dL 16.1  09.6  04.5   Hematocrit 36.0 - 46.0 % 32.6  35.0  35.2   Platelets 150 - 400 K/uL 156  167  172       Latest Ref Rng & Units 01/24/2023    9:52 AM 05/21/2022   11:24 AM 01/12/2022    1:33 PM  CMP  Glucose 70 - 99 mg/dL 409  811  86   BUN 8 - 23 mg/dL 23  22  19    Creatinine 0.44 - 1.00 mg/dL 9.14  7.82  9.56   Sodium 135 - 145 mmol/L 142  145  143   Potassium 3.5 - 5.1 mmol/L 4.2  4.8  4.1   Chloride 98 - 111 mmol/L 108  106  110   CO2 22 - 32 mmol/L 25  22  26    Calcium 8.9 - 10.3 mg/dL 9.9  9.6  9.4   Total Protein 6.5 - 8.1 g/dL 6.7  6.7  6.5   Total Bilirubin <1.2 mg/dL 0.4  0.4  0.6   Alkaline Phos 38 - 126 U/L 66  77  57   AST 15 - 41 U/L 25  21  24    ALT 0 - 44 U/L 11  13  17       Lab Results  Component Value Date   CEA1 5.1 (H) 01/12/2022   /  CEA  Date Value Ref Range Status  01/12/2022 5.1 (H) 0.0 - 4.7 ng/mL Final    Comment:    (NOTE)                             Nonsmokers          <3.9                             Smokers             <5.6 Roche [employer] Electrochemiluminescence Immunoassay (ECLIA) Values obtained with different assay methods or kits cannot be used  interchangeably.  Results cannot be interpreted as absolute evidence of the presence or absence of malignant disease. Performed At: [facility] [address] [place], [place] [id number] Jolene Schimke MD BM:[id number]    Lab Results  Component Value Date   FERRITIN 28 08/20/2019     STUDIES:  No results found.    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   Ingrown right big toenail 05/23/2022   Irregular heart beats 06/28/2021   Mixed hyperlipidemia 01/01/2021   NSVT (nonsustained ventricular tachycardia) (HCC) 08/07/2021   Pain of lower extremity 05/20/2020   Personal history of colon cancer, stage I 07/18/2015   Transient ischemic attack    Weakness 06/28/2021    Past Surgical History:  Procedure Laterality Date   CATARACT EXTRACTION     CHOLECYSTECTOMY     HEMICOLECTOMY  2017   SPHINCTEROTOMY  2011    Family History  Problem Relation Age of Onset   Dementia Mother    Lung cancer Father    Heart disease Father    Renal cancer Sister    Prostate cancer Brother    Breast cancer Paternal Grandmother     Social History:  reports that she has never smoked. She has never used smokeless tobacco. She reports that she does not drink alcohol and does not use drugs.The patient is alone today.  Allergies:  Allergies  Allergen Reactions   Nsaids Other (See Comments)    GI Bleed    Aspirin Other (See  Comments)   Atenolol Other (See Comments)   Naproxen Other (See Comments)   Propranolol Other (See Comments)    Unknown   Tizanidine Other (See Comments)    Dizziness     Current Medications: Current Outpatient Medications  Medication Sig Dispense Refill   ciclopirox (PENLAC) 8 % solution APPLY EXTERNALLY OVER NAIL AND SURROUNDING SKIN AT BEDTIME COVER PREVIOUS COAT, AFTER 7 DAYS MAY REMOVE WITH ALCOHOL AND CONTINUE CYCLE 6.6 mL 0   Cyanocobalamin (B-12 IJ) Inject as directed every 30 (thirty) days.     famotidine (PEPCID) 40 MG tablet TAKE 1 TABLET(40 MG) BY MOUTH TWICE DAILY (Patient taking differently: Take 40 mg by mouth daily. TAKE 1 TABLET(40 MG) BY MOUTH DAILY) 180 tablet 1   fluticasone (FLONASE) 50 MCG/ACT nasal spray Place 2 sprays into both nostrils daily. (Patient not taking: Reported on 01/24/2023) 16 g 6   metoprolol succinate (TOPROL XL) 25 MG 24 hr tablet Take 0.5 tablets (12.5 mg total) by mouth daily. 90 tablet 3   Current Facility-Administered Medications  Medication Dose Route Frequency Provider Last Rate Last Admin   triamcinolone acetonide (KENALOG-40) injection 80 mg  80 mg Intra-articular Once Blane Ohara, MD

## 2023-01-24 NOTE — Assessment & Plan Note (Signed)
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  She remains on B12 injections monthly.

## 2023-01-24 NOTE — Telephone Encounter (Signed)
Patient has been scheduled for follow-up visit per 01/24/23 LOS.  Pt given an appt calendar with date and time.

## 2023-01-25 LAB — HAPTOGLOBIN: Haptoglobin: 96 mg/dL (ref 41–333)

## 2023-01-26 ENCOUNTER — Other Ambulatory Visit: Payer: Self-pay | Admitting: Hematology and Oncology

## 2023-01-26 DIAGNOSIS — Z85038 Personal history of other malignant neoplasm of large intestine: Secondary | ICD-10-CM

## 2023-01-26 DIAGNOSIS — R970 Elevated carcinoembryonic antigen [CEA]: Secondary | ICD-10-CM

## 2023-01-27 ENCOUNTER — Other Ambulatory Visit: Payer: Self-pay | Admitting: Hematology and Oncology

## 2023-01-27 DIAGNOSIS — R970 Elevated carcinoembryonic antigen [CEA]: Secondary | ICD-10-CM

## 2023-01-27 DIAGNOSIS — Z85038 Personal history of other malignant neoplasm of large intestine: Secondary | ICD-10-CM

## 2023-01-31 ENCOUNTER — Telehealth: Payer: Self-pay | Admitting: Hematology and Oncology

## 2023-01-31 NOTE — Telephone Encounter (Signed)
CT C/A/P has been scheduled for 02/04/23; Checking in @ 3:30 pm   Notified pt of date,time and instructions.     Scheduling Message Entered by Belva Crome A on 01/26/2023 at  2:38 PM Priority: Routine <No visit type provided>  Department: [facility]  Provider:  Scheduling Notes:  Please schedule CT C/A/P. Elevated CEA, history of colon cancer.

## 2023-02-04 DIAGNOSIS — Z85038 Personal history of other malignant neoplasm of large intestine: Secondary | ICD-10-CM | POA: Diagnosis not present

## 2023-02-04 DIAGNOSIS — C189 Malignant neoplasm of colon, unspecified: Secondary | ICD-10-CM | POA: Diagnosis not present

## 2023-02-04 DIAGNOSIS — R970 Elevated carcinoembryonic antigen [CEA]: Secondary | ICD-10-CM | POA: Diagnosis not present

## 2023-02-14 ENCOUNTER — Encounter: Payer: Self-pay | Admitting: Hematology and Oncology

## 2023-02-17 ENCOUNTER — Telehealth: Payer: Self-pay

## 2023-02-17 NOTE — Telephone Encounter (Signed)
Referral faxed  to Dr Cristie Hem office.

## 2023-02-17 NOTE — Telephone Encounter (Signed)
-----   Message from Adah Perl sent at 02/17/2023 10:36 AM EST ----- Please refer to Dr. Georgiana Shore for consideration of colonoscopy early due to elevated CEA. Thanks

## 2023-02-17 NOTE — Addendum Note (Signed)
Addended by: Lianne Bushy on: 02/17/2023 02:20 PM   Modules accepted: Orders

## 2023-02-21 ENCOUNTER — Telehealth: Payer: Self-pay | Admitting: Hematology and Oncology

## 2023-02-21 ENCOUNTER — Other Ambulatory Visit: Payer: Self-pay | Admitting: Family Medicine

## 2023-02-21 ENCOUNTER — Other Ambulatory Visit: Payer: Self-pay | Admitting: Hematology and Oncology

## 2023-02-21 ENCOUNTER — Inpatient Hospital Stay: Payer: Medicare HMO | Attending: Oncology

## 2023-02-21 VITALS — BP 141/54 | HR 74 | Temp 97.7°F | Resp 18

## 2023-02-21 DIAGNOSIS — Z85038 Personal history of other malignant neoplasm of large intestine: Secondary | ICD-10-CM

## 2023-02-21 DIAGNOSIS — K219 Gastro-esophageal reflux disease without esophagitis: Secondary | ICD-10-CM

## 2023-02-21 DIAGNOSIS — E538 Deficiency of other specified B group vitamins: Secondary | ICD-10-CM | POA: Diagnosis not present

## 2023-02-21 DIAGNOSIS — R970 Elevated carcinoembryonic antigen [CEA]: Secondary | ICD-10-CM

## 2023-02-21 MED ORDER — CYANOCOBALAMIN 1000 MCG/ML IJ SOLN
1000.0000 ug | Freq: Once | INTRAMUSCULAR | Status: AC
  Administered 2023-02-21: 1000 ug via INTRAMUSCULAR
  Filled 2023-02-21: qty 1

## 2023-02-21 NOTE — Telephone Encounter (Signed)
Contacted pt to schedule an appt for labs and a fu with Belva Crome, PA-C at the ending of Feb 2025.   Unable to reach via phone, voicemail was left.

## 2023-02-21 NOTE — Patient Instructions (Signed)
 Vitamin B12 Injection What is this medication? Vitamin B12 (VAHY tuh min B12) prevents and treats low vitamin B12 levels in your body. It is used in people who do not get enough vitamin B12 from their diet or when their digestive tract does not absorb enough. Vitamin B12 plays an important role in maintaining the health of your nervous system and red blood cells. This medicine may be used for other purposes; ask your health care provider or pharmacist if you have questions. COMMON BRAND NAME(S): B-12 Compliance Kit, B-12 Injection Kit, Cyomin, Dodex, LA-12, Nutri-Twelve, Physicians EZ Use B-12, Primabalt, Vitamin Deficiency Injectable System - B12 What should I tell my care team before I take this medication? They need to know if you have any of these conditions: Kidney disease Leber's disease Megaloblastic anemia An unusual or allergic reaction to cyanocobalamin, cobalt, other medications, foods, dyes, or preservatives Pregnant or trying to get pregnant Breast-feeding How should I use this medication? This medication is injected into a muscle or deeply under the skin. It is usually given in a clinic or care team's office. However, your care team may teach you how to inject yourself. Follow all instructions. Talk to your care team about the use of this medication in children. Special care may be needed. Overdosage: If you think you have taken too much of this medicine contact a poison control center or emergency room at once. NOTE: This medicine is only for you. Do not share this medicine with others. What if I miss a dose? If you are given your dose at a clinic or care team's office, call to reschedule your appointment. If you give your own injections, and you miss a dose, take it as soon as you can. If it is almost time for your next dose, take only that dose. Do not take double or extra doses. What may interact with this medication? Alcohol Colchicine This list may not describe all possible  interactions. Give your health care provider a list of all the medicines, herbs, non-prescription drugs, or dietary supplements you use. Also tell them if you smoke, drink alcohol, or use illegal drugs. Some items may interact with your medicine. What should I watch for while using this medication? Visit your care team regularly. You may need blood work done while you are taking this medication. You may need to follow a special diet. Talk to your care team. Limit your alcohol intake and avoid smoking to get the best benefit. What side effects may I notice from receiving this medication? Side effects that you should report to your care team as soon as possible: Allergic reactions--skin rash, itching, hives, swelling of the face, lips, tongue, or throat Swelling of the ankles, hands, or feet Trouble breathing Side effects that usually do not require medical attention (report to your care team if they continue or are bothersome): Diarrhea This list may not describe all possible side effects. Call your doctor for medical advice about side effects. You may report side effects to FDA at 1-800-FDA-1088. Where should I keep my medication? Keep out of the reach of children. Store at room temperature between 15 and 30 degrees C (59 and 85 degrees F). Protect from light. Throw away any unused medication after the expiration date. NOTE: This sheet is a summary. It may not cover all possible information. If you have questions about this medicine, talk to your doctor, pharmacist, or health care provider.  2024 Elsevier/Gold Standard (2020-10-28 00:00:00)

## 2023-03-11 ENCOUNTER — Telehealth: Payer: Self-pay | Admitting: Cardiology

## 2023-03-11 MED ORDER — METOPROLOL SUCCINATE ER 25 MG PO TB24
12.5000 mg | ORAL_TABLET | Freq: Every day | ORAL | 2 refills | Status: DC
  Filled 2023-06-08: qty 45, 90d supply, fill #0
  Filled 2023-08-31: qty 45, 90d supply, fill #1

## 2023-03-11 NOTE — Telephone Encounter (Signed)
 Refill of Metoprolol Succinate 25 mg sent to [facility] on [address], [place].

## 2023-03-11 NOTE — Telephone Encounter (Signed)
*  STAT* If patient is at the pharmacy, call can be transferred to refill team.   1. Which medications need to be refilled? (please list name of each medication and dose if known) metoprolol  succinate (TOPROL  XL) 25 MG 24 hr tablet   2. Which pharmacy/location (including street and city if local pharmacy) is medication to be sent to?  [facility] [phone] - [place], [place] - [address]      3. Do they need a 30 day or 90 day supply? 90 day

## 2023-03-14 ENCOUNTER — Encounter: Payer: Self-pay | Admitting: Hematology and Oncology

## 2023-03-15 ENCOUNTER — Encounter: Payer: Self-pay | Admitting: Hematology and Oncology

## 2023-03-17 DIAGNOSIS — M159 Polyosteoarthritis, unspecified: Secondary | ICD-10-CM | POA: Diagnosis not present

## 2023-03-17 DIAGNOSIS — R2681 Unsteadiness on feet: Secondary | ICD-10-CM | POA: Diagnosis not present

## 2023-03-17 DIAGNOSIS — Z79899 Other long term (current) drug therapy: Secondary | ICD-10-CM | POA: Diagnosis not present

## 2023-03-17 DIAGNOSIS — K219 Gastro-esophageal reflux disease without esophagitis: Secondary | ICD-10-CM | POA: Diagnosis not present

## 2023-03-17 DIAGNOSIS — M1711 Unilateral primary osteoarthritis, right knee: Secondary | ICD-10-CM | POA: Diagnosis not present

## 2023-03-17 DIAGNOSIS — E559 Vitamin D deficiency, unspecified: Secondary | ICD-10-CM | POA: Diagnosis not present

## 2023-03-17 DIAGNOSIS — I10 Essential (primary) hypertension: Secondary | ICD-10-CM | POA: Diagnosis not present

## 2023-03-21 ENCOUNTER — Encounter: Payer: Self-pay | Admitting: Hematology and Oncology

## 2023-03-21 ENCOUNTER — Inpatient Hospital Stay: Payer: Self-pay | Attending: Oncology

## 2023-03-21 VITALS — BP 153/68 | HR 64 | Temp 97.8°F | Resp 20 | Ht 67.0 in | Wt 167.1 lb

## 2023-03-21 DIAGNOSIS — E538 Deficiency of other specified B group vitamins: Secondary | ICD-10-CM | POA: Diagnosis not present

## 2023-03-21 MED ORDER — CYANOCOBALAMIN 1000 MCG/ML IJ SOLN
1000.0000 ug | Freq: Once | INTRAMUSCULAR | Status: AC
  Administered 2023-03-21: 1000 ug via INTRAMUSCULAR
  Filled 2023-03-21: qty 1

## 2023-03-21 NOTE — Patient Instructions (Signed)
 Vitamin B12 Injection What is this medication? Vitamin B12 (VAHY tuh min B12) prevents and treats low vitamin B12 levels in your body. It is used in people who do not get enough vitamin B12 from their diet or when their digestive tract does not absorb enough. Vitamin B12 plays an important role in maintaining the health of your nervous system and red blood cells. This medicine may be used for other purposes; ask your health care provider or pharmacist if you have questions. COMMON BRAND NAME(S): B-12 Compliance Kit, B-12 Injection Kit, Cyomin, Dodex, LA-12, Nutri-Twelve, Physicians EZ Use B-12, Primabalt, Vitamin Deficiency Injectable System - B12 What should I tell my care team before I take this medication? They need to know if you have any of these conditions: Kidney disease Leber's disease Megaloblastic anemia An unusual or allergic reaction to cyanocobalamin, cobalt, other medications, foods, dyes, or preservatives Pregnant or trying to get pregnant Breast-feeding How should I use this medication? This medication is injected into a muscle or deeply under the skin. It is usually given in a clinic or care team's office. However, your care team may teach you how to inject yourself. Follow all instructions. Talk to your care team about the use of this medication in children. Special care may be needed. Overdosage: If you think you have taken too much of this medicine contact a poison control center or emergency room at once. NOTE: This medicine is only for you. Do not share this medicine with others. What if I miss a dose? If you are given your dose at a clinic or care team's office, call to reschedule your appointment. If you give your own injections, and you miss a dose, take it as soon as you can. If it is almost time for your next dose, take only that dose. Do not take double or extra doses. What may interact with this medication? Alcohol Colchicine This list may not describe all possible  interactions. Give your health care provider a list of all the medicines, herbs, non-prescription drugs, or dietary supplements you use. Also tell them if you smoke, drink alcohol, or use illegal drugs. Some items may interact with your medicine. What should I watch for while using this medication? Visit your care team regularly. You may need blood work done while you are taking this medication. You may need to follow a special diet. Talk to your care team. Limit your alcohol intake and avoid smoking to get the best benefit. What side effects may I notice from receiving this medication? Side effects that you should report to your care team as soon as possible: Allergic reactions--skin rash, itching, hives, swelling of the face, lips, tongue, or throat Swelling of the ankles, hands, or feet Trouble breathing Side effects that usually do not require medical attention (report to your care team if they continue or are bothersome): Diarrhea This list may not describe all possible side effects. Call your doctor for medical advice about side effects. You may report side effects to FDA at 1-800-FDA-1088. Where should I keep my medication? Keep out of the reach of children. Store at room temperature between 15 and 30 degrees C (59 and 85 degrees F). Protect from light. Throw away any unused medication after the expiration date. NOTE: This sheet is a summary. It may not cover all possible information. If you have questions about this medicine, talk to your doctor, pharmacist, or health care provider.  2024 Elsevier/Gold Standard (2020-10-28 00:00:00)

## 2023-03-22 ENCOUNTER — Encounter: Payer: Self-pay | Admitting: Hematology and Oncology

## 2023-03-22 ENCOUNTER — Ambulatory Visit: Payer: HMO | Admitting: Podiatry

## 2023-03-22 ENCOUNTER — Encounter: Payer: Self-pay | Admitting: Podiatry

## 2023-03-22 DIAGNOSIS — B351 Tinea unguium: Secondary | ICD-10-CM

## 2023-03-22 DIAGNOSIS — M79675 Pain in left toe(s): Secondary | ICD-10-CM | POA: Diagnosis not present

## 2023-03-22 DIAGNOSIS — M79674 Pain in right toe(s): Secondary | ICD-10-CM

## 2023-03-22 MED ORDER — CICLOPIROX 8 % EX SOLN
Freq: Every day | CUTANEOUS | 3 refills | Status: AC
  Filled 2023-06-06: qty 6.6, 25d supply, fill #0
  Filled 2023-08-10: qty 6.6, 25d supply, fill #1

## 2023-03-22 NOTE — Progress Notes (Unsigned)
  Subjective:  Patient ID: Rebecca Orr, female    DOB: [birth year],  MRN: [id number]  Chief Complaint  Patient presents with   RFC    RFC not diabetic, no asa produts, she gets GI bleeds.    83 y.o. female presents with the above complaint. History confirmed with patient. Patient presenting with pain related to dystrophic thickened elongated nails. Patient is unable to trim own nails related to nail dystrophy and/or mobility issues. Patient does not have a history of T2DM.  Patient previously had ingrown nail removed on June 22, 2022, she does report some dissatisfaction with the nail overall and its appearance and also states that the medial border is sometimes tender though there are not obvious signs of nail regrowth at this point.  She has been using ciclopirox topical antifungal medication since May 8, states she is only been using it on the right first toenail due to going on medication  Objective:  Physical Exam: warm, good capillary refill nail exam onychomycosis of the toenails, onycholysis, and dystrophic nails DP pulses palpable, PT pulses palpable, and protective sensation intact Left Foot:  Pain with palpation of nails due to elongation and dystrophic growth.  Right Foot: Pain with palpation of nails due to elongation and dystrophic growth.  Right first toenail is showing evidence of proximal clearance.  There is tenderness on palpation of the medial border of the right hallux nail plate without obvious signs of incurvation.  Assessment:   1. Pain due to onychomycosis of toenails of both feet      Plan:  Patient was evaluated and treated and all questions answered.   #Onychomycosis with pain  -Nails palliatively debrided as below. -Educated on self-care -Continue with ciclopirox 8% topical antifungal solution apply to all nails daily for the next 3 months -She has had some improvement with continued use of the topical medication -Total nail avulsion was  offered to the patient today, she would like to defer this.  Can also consider partial nail avulsion if there is some evidence of nail regrowth.  Advised her to follow-up with this if she would like to have procedure done.  Procedure: Nail Debridement Rationale: Pain Type of Debridement: manual, sharp debridement. Instrumentation: Nail nipper, rotary burr. Number of Nails: 10  Return in about 3 months (around 06/20/2023) for Routine Foot Care.         Bronwen Betters, DPM [facility]

## 2023-03-28 DIAGNOSIS — R2689 Other abnormalities of gait and mobility: Secondary | ICD-10-CM | POA: Diagnosis not present

## 2023-03-31 DIAGNOSIS — R2689 Other abnormalities of gait and mobility: Secondary | ICD-10-CM | POA: Diagnosis not present

## 2023-04-01 ENCOUNTER — Telehealth: Payer: Self-pay

## 2023-04-01 NOTE — Telephone Encounter (Signed)
Patient called call center and left message regarding labs from last appt and  up coming colonoscopy. Addendum on last office note 01/24/23 Rebecca Orr spoke with patient on lab results ordered scans which patient did have done and were reviewed with patient. Also patient is on the call list for a colonoscopy with Dr Georgiana Shore so if someone cancels prior to her scheduled colonoscopy in April she can be seen sooner. Patient aware of her next appt and voiced all her questions were answered.

## 2023-04-04 DIAGNOSIS — R42 Dizziness and giddiness: Secondary | ICD-10-CM | POA: Diagnosis not present

## 2023-04-04 DIAGNOSIS — R0600 Dyspnea, unspecified: Secondary | ICD-10-CM | POA: Diagnosis not present

## 2023-04-04 DIAGNOSIS — R2689 Other abnormalities of gait and mobility: Secondary | ICD-10-CM | POA: Diagnosis not present

## 2023-04-06 DIAGNOSIS — R42 Dizziness and giddiness: Secondary | ICD-10-CM | POA: Diagnosis not present

## 2023-04-06 DIAGNOSIS — R0600 Dyspnea, unspecified: Secondary | ICD-10-CM | POA: Diagnosis not present

## 2023-04-06 DIAGNOSIS — R2689 Other abnormalities of gait and mobility: Secondary | ICD-10-CM | POA: Diagnosis not present

## 2023-04-11 ENCOUNTER — Encounter: Payer: Self-pay | Admitting: Hematology and Oncology

## 2023-04-12 ENCOUNTER — Telehealth: Payer: Self-pay

## 2023-04-12 DIAGNOSIS — C184 Malignant neoplasm of transverse colon: Secondary | ICD-10-CM | POA: Diagnosis not present

## 2023-04-12 DIAGNOSIS — D509 Iron deficiency anemia, unspecified: Secondary | ICD-10-CM | POA: Diagnosis not present

## 2023-04-12 NOTE — Telephone Encounter (Signed)
Primary Cardiologist:None   Preoperative team, please contact this patient and set up a phone call appointment for further preoperative risk assessment. Please obtain consent and complete medication review. Thank you for your help.   I confirm that guidance regarding antiplatelet and oral anticoagulation therapy has been completed and, if necessary, noted below (none requested).  I also confirmed the patient resides in the state of [place]. As per [facility] Medical Board telemedicine laws, the patient must reside in the state in which the provider is licensed.   Levi Aland, NP-C  04/12/2023, 4:37 PM [address], [address] Office [phone] Fax [phone]

## 2023-04-12 NOTE — Telephone Encounter (Signed)
   Pre-operative Risk Assessment    Patient Name: Rebecca Orr  DOB: Jun 07, 1940 MRN: [id number]   Date of last office visit: 12/01/22 Date of next office visit: n/a   Request for Surgical Clearance    Procedure:   Colonoscopy   Date of Surgery:  Clearance 04/28/23                                 Surgeon:  Terrilee Files, MD  Surgeon's Group or Practice Name:  [facility] [place]  Phone number:  [phone] Fax number:  [phone]   Type of Clearance Requested:   - Medical    Type of Anesthesia:   Propofol    Additional requests/questions:    Vance Peper   04/12/2023, 3:47 PM

## 2023-04-12 NOTE — Telephone Encounter (Signed)
S/w the pt about tele preop appt. I stated to the pt that I did not have any open tele preop appt until the day before her procedure. I offered the pt an in office as to not delay the procedure. Pt opts for in office.   Pt has been scheduled to see Wallis Bamberg, NP 04/19/23 @ 2;45 in the [place] office.  Pt also tells me that she has been a ( bit dizzy at times and feel her heart skipping beats).   I will update all parties involved.

## 2023-04-13 DIAGNOSIS — R0600 Dyspnea, unspecified: Secondary | ICD-10-CM | POA: Diagnosis not present

## 2023-04-13 DIAGNOSIS — R2689 Other abnormalities of gait and mobility: Secondary | ICD-10-CM | POA: Diagnosis not present

## 2023-04-13 DIAGNOSIS — R42 Dizziness and giddiness: Secondary | ICD-10-CM | POA: Diagnosis not present

## 2023-04-14 DIAGNOSIS — E559 Vitamin D deficiency, unspecified: Secondary | ICD-10-CM | POA: Diagnosis not present

## 2023-04-14 DIAGNOSIS — D649 Anemia, unspecified: Secondary | ICD-10-CM | POA: Diagnosis not present

## 2023-04-14 DIAGNOSIS — I10 Essential (primary) hypertension: Secondary | ICD-10-CM | POA: Diagnosis not present

## 2023-04-14 DIAGNOSIS — R2681 Unsteadiness on feet: Secondary | ICD-10-CM | POA: Diagnosis not present

## 2023-04-14 DIAGNOSIS — Z6826 Body mass index (BMI) 26.0-26.9, adult: Secondary | ICD-10-CM | POA: Diagnosis not present

## 2023-04-14 DIAGNOSIS — M1711 Unilateral primary osteoarthritis, right knee: Secondary | ICD-10-CM | POA: Diagnosis not present

## 2023-04-14 DIAGNOSIS — M159 Polyosteoarthritis, unspecified: Secondary | ICD-10-CM | POA: Diagnosis not present

## 2023-04-14 DIAGNOSIS — K219 Gastro-esophageal reflux disease without esophagitis: Secondary | ICD-10-CM | POA: Diagnosis not present

## 2023-04-15 ENCOUNTER — Encounter: Payer: Self-pay | Admitting: Hematology and Oncology

## 2023-04-15 ENCOUNTER — Other Ambulatory Visit (HOSPITAL_BASED_OUTPATIENT_CLINIC_OR_DEPARTMENT_OTHER): Payer: Self-pay

## 2023-04-15 MED ORDER — ESCITALOPRAM OXALATE 5 MG PO TABS
5.0000 mg | ORAL_TABLET | Freq: Every day | ORAL | 1 refills | Status: DC
  Filled 2023-04-15: qty 30, 30d supply, fill #0
  Filled 2023-05-11: qty 30, 30d supply, fill #1

## 2023-04-18 ENCOUNTER — Other Ambulatory Visit: Payer: Self-pay | Admitting: Hematology and Oncology

## 2023-04-18 ENCOUNTER — Telehealth: Payer: Self-pay

## 2023-04-18 ENCOUNTER — Inpatient Hospital Stay: Payer: HMO

## 2023-04-18 ENCOUNTER — Inpatient Hospital Stay: Payer: HMO | Admitting: Hematology and Oncology

## 2023-04-18 ENCOUNTER — Ambulatory Visit: Payer: Medicare HMO

## 2023-04-18 DIAGNOSIS — Z85038 Personal history of other malignant neoplasm of large intestine: Secondary | ICD-10-CM

## 2023-04-18 DIAGNOSIS — R970 Elevated carcinoembryonic antigen [CEA]: Secondary | ICD-10-CM

## 2023-04-18 DIAGNOSIS — D539 Nutritional anemia, unspecified: Secondary | ICD-10-CM

## 2023-04-18 NOTE — Progress Notes (Deleted)
 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  She was treated with surgical resection with findings of a 3.5 cm adenocarcinoma with 12 negative nodes.  She underwent cholecystectomy at that time due to chronic cholecystitis.  She has had intermittent elevation of the CEA of uncertain etiology.  Repeat imaging over the years has never revealed any evidence of recurrence.  Colonoscopy in January 2021 did not reveal any evidence of recurrence. Follow up in 5 years was recommended. At her visit in November 2024, her CEA has increased again to 6.18, which was the highest it had ever been.  CT chest, abdomen pelvis in December did not reveal any evidence of recurrence or metastatic disease.  She saw Dr. Georgiana Shore for consideration of colonoscopy and EGD, as her hemoglobin dropped as well.  He is awaiting cardiac clearance prior to the procedures.  Deficiency anemia B12 deficiency diagnosed in October 2018.  She remains on B12 injections monthly.    The patient understands the plans discussed today and is in agreement with them.  She knows to contact our office if she develops concerns prior to her next appointment.   I provided *** minutes of face-to-face time during this encounter and > 50% was spent counseling as documented under my assessment and plan.    Adah Perl, PA-C  [facility] [place] - A DEPT OF [facility] [address] [place] [address] Dept: [phone] Dept Fax: [phone]   No orders of the defined types were placed in this encounter.     CHIEF COMPLAINT:  CC: Personal history of stage I colon cancer with elevated CEA  Current Treatment:  Surveillance  HISTORY OF PRESENT ILLNESS:   Oncology History  Malignant neoplasm of transverse colon (HCC) (Resolved)  07/16/2015 Cancer Staging   Staging form: Colon and Rectum, AJCC 7th Edition - Clinical stage from 07/16/2015: Stage I (T2, N0, M0) - Signed by Dellia Beckwith, MD on 01/30/2022 Staged by: Managing physician Diagnostic confirmation: Positive histology Specimen type: Excision Histopathologic type: Adenocarcinoma, NOS Stage prefix: Initial diagnosis Laterality: Right Tumor size (mm): 35 Histologic grade (G): G1 Lymph-vascular invasion (LVI): LVI not present (absent)/not identified Residual tumor (R): R0 - None Tumor deposits (TD): Absent Perineural invasion (PNI): Absent Microsatellite instability (MSI): Unstable high KRAS gene analysis: Not assessed Prognostic indicators: MMR abnormal, BRAF V600E mutation positive, 12 neg nodes Stage used in treatment planning: Yes National guidelines used in treatment planning: Yes Type of national guideline used in treatment planning: NCCN   07/22/2015 Initial Diagnosis   Malignant neoplasm of transverse colon (HCC)       INTERVAL HISTORY:  Chanin is here today for repeat clinical assessment.  At her visit in November 2024, her CEA was elevated at 6.18, the highest it has ever been.  She also had worsening anemia, no nutritional deficiency was found.  She has seen Dr. Georgiana Shore and is awaiting surgical clearance from cardiology.  She denies fevers or chills. She denies pain. Her appetite is good. Her weight {Weight change:10426}.  REVIEW OF SYSTEMS:  Review of Systems - Oncology   VITALS:  There were no vitals taken for this visit.  Wt Readings from  Last 3 Encounters:  03/21/23 167 lb 1.9 oz (75.8 kg)  01/24/23 172 lb 6.4 oz (78.2 kg)  12/27/22 169 lb (76.7 kg)    There is no height or weight on file to calculate BMI.  Performance status (ECOG): {CHL ONC [id number]  PHYSICAL EXAM:  Physical Exam  LABS:       Latest Ref Rng & Units 01/24/2023    9:52 AM 05/21/2022   11:24 AM 01/12/2022   10:14 AM  CBC  WBC 4.0 - 10.5 K/uL 4.7  3.7  4.4   Hemoglobin 12.0 - 15.0 g/dL 96.0  45.4  09.8   Hematocrit 36.0 - 46.0 % 32.6  35.0  35.2   Platelets 150 - 400 K/uL 156  167  172       Latest Ref Rng & Units 01/24/2023    9:52 AM 05/21/2022   11:24 AM 01/12/2022    1:33 PM  CMP  Glucose 70 - 99 mg/dL 119  147  86   BUN 8 - 23 mg/dL 23  22  19    Creatinine 0.44 - 1.00 mg/dL 8.29  5.62  1.30   Sodium 135 - 145 mmol/L 142  145  143   Potassium 3.5 - 5.1 mmol/L 4.2  4.8  4.1   Chloride 98 - 111 mmol/L 108  106  110   CO2 22 - 32 mmol/L 25  22  26    Calcium 8.9 - 10.3 mg/dL 9.9  9.6  9.4   Total Protein 6.5 - 8.1 g/dL 6.7  6.7  6.5   Total Bilirubin <1.2 mg/dL 0.4  0.4  0.6   Alkaline Phos 38 - 126 U/L 66  77  57   AST 15 - 41 U/L 25  21  24    ALT 0 - 44 U/L 11  13  17       Lab Results  Component Value Date   CEA1 5.1 (H) 01/12/2022   CEA 6.18 (H) 01/24/2023   /  CEA  Date Value Ref Range Status  01/12/2022 5.1 (H) 0.0 - 4.7 ng/mL Final    Comment:    (NOTE)                             Nonsmokers          <3.9                             Smokers             <5.6 Roche [employer] Electrochemiluminescence Immunoassay (ECLIA) Values obtained with different assay methods or kits cannot be used interchangeably.  Results cannot be interpreted as absolute evidence of the presence or absence of malignant disease. Performed At: [facility] [address] [place], [place] [id number] Jolene Schimke MD EX:[id number]    CEA [facility])  Date Value Ref Range Status  01/24/2023 6.18 (H) 0.00 - 5.00 ng/mL Final    Comment:    (NOTE) This test was performed using Beckman Coulter's paramagnetic chemiluminescent immunoassay. Values obtained from different assay methods cannot be used interchangeably. Please note that up to 8% of patients who smoke may see values 5.1-10.0 ng/ml and 1% of  patients who smoke may see CEA levels >10.0 ng/ml. Performed at [facility], [address], [place], [place] [address]    No results found for: "PSA1" No results found for: [phone]" No results found  for: "ZOX096"  No results found for: "TOTALPROTELP", "ALBUMINELP", "A1GS", "A2GS", "BETS", "BETA2SER", "GAMS", "MSPIKE", "SPEI" Lab Results  Component Value Date   TIBC 417 01/24/2023   FERRITIN 32 01/24/2023   FERRITIN 28 08/20/2019   IRONPCTSAT 22 01/24/2023   No results found for: "LDH"  STUDIES:    Exam(s): [id number] CT/CT CHEST-ABD-PELV W/IV CM  EXAM:  CT CHEST-ABD-PELV W/IV CM  CLINICAL HISTORY:  Z85.038/R97.0  COMPARISON:  03/30/2021, 04/22/2020.  TECHNIQUE:  Routine CT of the chest, abdomen, and pelvis after administration of intravenous contrast. Multi planer reformatted images available for review.  Dose reduction techniques were used including intermediate exposure control (AEC),iterative reconstruction technique, and/or mA and/or KV dose adjustments based on patient's size.  FINDINGS:  The structures at the base of the neck are unremarkable. 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 The aorta maintains normal caliber, contour and, and course. There is mild aortic and branch vessel atherosclerosis. The heart is nonenlarged and there is no pericardial disease. The pulmonary trunk measures 3.1 cm. There is no pleural disease. Axillary, mediastinal, hilar, and retrocrural lymph nodes are normal size.  The trachea and bronchi are patent. The lungs are well-expanded. There is a calcified right middle lobe granuloma. 3 mm right lower lobe pulmonary nodule, image 33, series 301, stable.  The gallbladder is surgically absent. There is diffuse pancreatic parenchymal atrophy. The liver, and adrenal glands are unremarkable. There is a 2.6 x 2.8 cm hypoattenuating (56 Hounsfield units) mass within the central spleen, unchanged in appearance when  compared to examination 04/22/2020. 13 mm right adrenal nodule is stable.  The kidneys, ureters, and bladder are unremarkable. There is a 2.7 cm left adnexal cyst.  There is aortic atherosclerosis. The aorta maintains normal caliber and contour. The stomach, duodenum, distal small intestine, and colon demonstrate no significant abnormality. The appendix is not visualized. There is sigmoid diverticulosis coli.  Degenerative changes of the thoracolumbar spine. The bones demonstrate diffuse osteopenia.  IMPRESSION No evidence of local recurrence or metastatic disease.  2.2 cm splenic mass unchanged since 04/22/2020.  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   Ingrown right big toenail 05/23/2022   Irregular heart beats 06/28/2021   Mixed hyperlipidemia 01/01/2021   NSVT (nonsustained ventricular tachycardia) (HCC) 08/07/2021   Pain of lower extremity 05/20/2020   Personal history of colon cancer, stage I 07/18/2015   Transient ischemic attack    Weakness 06/28/2021    Past Surgical History:  Procedure Laterality Date   CATARACT EXTRACTION     CHOLECYSTECTOMY     HEMICOLECTOMY  2017   SPHINCTEROTOMY  2011    Family History  Problem Relation Age of Onset   Dementia Mother    Lung cancer Father    Heart disease Father    Renal cancer Sister    Prostate cancer Brother    Breast cancer Paternal Grandmother      Social History:  reports that she has never smoked. She has  never used smokeless tobacco. She reports that she does not drink alcohol and does not use drugs.The patient is {Blank single:19197::"alone","accompanied by"} *** today.  Allergies:  Allergies  Allergen Reactions   Nsaids Other (See Comments)    GI Bleed    Aspirin Other (See Comments)   Atenolol Other (See Comments)   Naproxen Other (See Comments)   Propranolol Other (See Comments)    Unknown   Tizanidine Other (See Comments)    Dizziness     Current Medications: Current Outpatient Medications  Medication Sig Dispense Refill   ciclopirox (PENLAC) 8 % solution Apply topically at bedtime. Apply over nail and surrounding skin. Apply daily over previous coat. After seven (7) days, may remove with alcohol and continue cycle. 6.6 mL 3   Cyanocobalamin (B-12 IJ) Inject as directed every 30 (thirty) days.     escitalopram (LEXAPRO) 5 MG tablet Take 1 tablet (5 mg total) by mouth daily. 30 tablet 1   famotidine (PEPCID) 40 MG tablet TAKE 1 TABLET(40 MG) BY MOUTH TWICE DAILY 180 tablet 1   fluticasone (FLONASE) 50 MCG/ACT nasal spray Place 2 sprays into both nostrils daily. 16 g 6   metoprolol succinate (TOPROL XL) 25 MG 24 hr tablet Take 0.5 tablets (12.5 mg total) by mouth daily. 45 tablet 2   Current Facility-Administered Medications  Medication Dose Route Frequency Provider Last Rate Last Admin   triamcinolone acetonide (KENALOG-40) injection 80 mg  80 mg Intra-articular Once Blane Ohara, MD

## 2023-04-18 NOTE — Assessment & Plan Note (Deleted)
B12 deficiency diagnosed in October 2018.  She remains on B12 injections monthly.

## 2023-04-18 NOTE — Progress Notes (Deleted)
  Cardiology Office Note:  .   Date:  04/18/2023  ID:  Rebecca Orr, DOB 1940-05-25, MRN [id number] PCP: Blane Ohara, MD  [facility] Providers Cardiologist:  Garwin Brothers, MD { Click to update primary MD,subspecialty MD or APP then REFRESH:1}   History of Present Illness: .   Rebecca Orr is a 83 y.o. female with a past medical history of coronary artery calcification noted on CT imaging, NSVT, dyslipidemia  08/18/2021 Lexiscan low risk, normal study 06/26/2021 monitor heart rate 74 bpm, predominant rhythm was sinus, 1 run of VT lasting 13 beats, 6 episodes of SVT 03/30/2018 echo EF 60 to 65%, impaired relaxation  Most recently evaluated by Dr. Tomie China on 12/01/2022, she was stable, advised to follow-up in 9 months.  ROS: ROS   Studies Reviewed: .        *** Risk Assessment/Calculations:   {Does this patient have ATRIAL FIBRILLATION?:[phone]} No BP recorded.  {Refresh Note OR Click here to enter BP  :1}***       Physical Exam:   VS:  There were no vitals taken for this visit.   Wt Readings from Last 3 Encounters:  03/21/23 167 lb 1.9 oz (75.8 kg)  01/24/23 172 lb 6.4 oz (78.2 kg)  12/27/22 169 lb (76.7 kg)    GEN: Well nourished, well developed in no acute distress NECK: No JVD; No carotid bruits CARDIAC: ***RRR, no murmurs, rubs, gallops RESPIRATORY:  Clear to auscultation without rales, wheezing or rhonchi  ABDOMEN: Soft, non-tender, non-distended EXTREMITIES:  No edema; No deformity   ASSESSMENT AND PLAN: .       {Are you ordering a CV Procedure (e.g. stress test, cath, DCCV, TEE, etc)?   Press F2        :[id number]}  Dispo: ***  Signed, Flossie Dibble, NP

## 2023-04-18 NOTE — Telephone Encounter (Signed)
Patient called to see if we could like for her to reschedule due to patient having an headache and sore throat. Called patient back and patient had already talked with scheduling and R/S appt.

## 2023-04-18 NOTE — Assessment & Plan Note (Signed)
B12 deficiency diagnosed in October 2018.  She remains on B12 injections monthly.

## 2023-04-18 NOTE — Assessment & Plan Note (Signed)
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  She was treated with surgical resection with findings of a 3.5 cm adenocarcinoma with 12 negative nodes.  She underwent cholecystectomy at that time due to chronic cholecystitis.  She has had intermittent elevation of the CEA of uncertain etiology.  Repeat imaging over the years has never revealed any evidence of recurrence.  Colonoscopy in January 2021 did not reveal any evidence of recurrence. Follow up in 5 years was recommended. At her visit in November 2024, her CEA has increased again to 6.18, which was the highest it had ever been.  CT chest, abdomen pelvis in December did not reveal any evidence of recurrence or metastatic disease.  She saw Dr. Georgiana Shore for consideration of colonoscopy and EGD, as her hemoglobin dropped as well.  He is awaiting cardiac clearance prior to the procedures.

## 2023-04-19 ENCOUNTER — Ambulatory Visit: Payer: Self-pay | Admitting: Cardiology

## 2023-04-19 DIAGNOSIS — I700 Atherosclerosis of aorta: Secondary | ICD-10-CM

## 2023-04-19 DIAGNOSIS — I2510 Atherosclerotic heart disease of native coronary artery without angina pectoris: Secondary | ICD-10-CM

## 2023-04-19 DIAGNOSIS — I4729 Other ventricular tachycardia: Secondary | ICD-10-CM

## 2023-04-26 ENCOUNTER — Inpatient Hospital Stay: Payer: Self-pay | Admitting: Oncology

## 2023-04-26 ENCOUNTER — Inpatient Hospital Stay: Payer: Self-pay

## 2023-04-28 ENCOUNTER — Encounter: Payer: Self-pay | Admitting: Hematology and Oncology

## 2023-04-28 ENCOUNTER — Ambulatory Visit: Payer: Self-pay | Admitting: Cardiology

## 2023-05-02 NOTE — Progress Notes (Signed)
 Cardiology Office Note:  .   Date:  05/03/2023  ID:  Rebecca Orr, DOB Nov 29, 1940, MRN [id number] PCP: Lucianne Lei, MD  [facility] Providers Cardiologist:  Garwin Brothers, MD    History of Present Illness: .   Rebecca Orr is a 83 y.o. female with a past medical history of coronary artery calcification noted on CT imaging, NSVT, SVT, dyslipidemia, colon cancer.   08/18/2021 Lexiscan low risk, normal study 06/26/2021 monitor heart rate 74 bpm, predominant rhythm was sinus, 1 run of VT lasting 13 beats, 6 episodes of SVT 03/30/2018 echo EF 60 to 65%, impaired relaxation  Most recently evaluated by Dr. Tomie China on 12/01/2022, she was stable, advised to follow-up in 9 months.  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  She has been bothered by episodes of dizziness, she verbalized that she feels it may be related to anxiety and in fact her PCP recently started her on a low-dose SSRI to help with her symptoms.  She has some DOE, hard for her to decipher if this is worse than normal or at baseline.  She takes care of her own affairs, tries to stay active. She denies chest pain, palpitations, dyspnea, pnd, orthopnea, n, v, dizziness, syncope, edema, weight gain, or early satiety.   ROS: Review of Systems  Respiratory:  Positive for shortness of breath.   Cardiovascular:  Positive for palpitations.  Neurological:  Positive for dizziness.     Studies Reviewed: Marland Kitchen   EKG Interpretation Date/Time:  Tuesday May 03 2023 11:01:54 EST Ventricular Rate:  68 PR Interval:  136 QRS Duration:  68 QT Interval:  362 QTC Calculation: 384 R Axis:   -6  Text Interpretation: Normal sinus rhythm Cannot rule out Anterior infarct (cited on or before 01-Dec-2022) When compared with ECG of 01-Dec-2022 15:04, Premature ventricular complexes are no longer Present Vent. rate has decreased BY  55 BPM Criteria for  Inferior infarct are no longer Present Questionable change in initial forces of Anterior leads T wave inversion no longer evident in Inferior leads Confirmed by Wallis Bamberg [phone]) on 05/03/2023 11:05:09 AM    Cardiac Studies & Procedures   ______________________________________________________________________________________________   STRESS TESTS  MYOCARDIAL PERFUSION IMAGING 08/18/2021  Narrative   Findings are consistent with no ischemia and no prior myocardial infarction. The study is low risk.   No ST deviation was noted.   Left ventricular function is normal. Nuclear stress EF: 70 %. The left ventricular ejection fraction is hyperdynamic (>65%). End diastolic cavity size is normal.   Prior study available for comparison from 05/03/2018.   ECHOCARDIOGRAM  ECHOCARDIOGRAM COMPLETE 03/30/2018  Narrative TRANSTHORACIC ECHOCARDIOGRAM REPORT    Patient Name:   [facility] Octaviano Glow Date of Exam: 03/30/2018 Medical Rec #:  [id number]                     Height:       67.0 in Accession #:    [id number]                    Weight:       165.6 lb Date of Birth:  15-Jan-1941                      BSA:          1.87 m Patient Age:    62 years  BP:           120/60 mmHg Patient Gender: F                             HR:           77 bpm. Exam Location:  [place]   Procedure: 2D Echo  Indications:    R00.2 Palpitations  History:        Patient has no prior history of Echocardiogram examinations. Signs/Symptoms: Chest pain; Risk Factors: Hypertension.  Sonographer:    Louie Boston Referring Phys: Rito Ehrlich [facility]   FINDINGS Left Ventricle: No evidence of left ventricular regional wall motion abnormalities. The left ventricle has normal systolic function of 60-65%. The cavity size is normal. There is borderline left ventricular wall thickness. Echo evidence of impaired relaxation diastolic filling patterns. Right Ventricle: The right ventricle is normal in  size. There is normal hypertrophy. There is normal systolic function. Right ventricular systolic pressure is normal with an estimated pressure of 26.7 mmHg. Left Atrium: The left atrium is normal in size. Right Atrium: The right atrial size is normal in size. Interatrial Septum: No atrial level shunt detected by color flow Doppler.  Pericardium: There is no evidence of pericardial effusion. Mitral Valve: The mitral valve normal in structure. Regurgitation is not visualized by color flow Doppler. Tricuspid Valve: The tricuspid valve is normal in structure. Tricuspid regurgitation is mild by color flow Doppler. Aortic Valve: The aortic valve normal in structure and function. Pulmonic Valve: The pulmonic valve is normal. Pulmonic valve regurgitation is not visualized by color flow Doppler. Venous: The inferior vena cava was normal in size with greater than 50% respiratory variablity.  LEFT VENTRICLE PLAX 2D (Teich) LV EF:          57.7 %   Diastology LVIDd:          3.70 cm  LV e' lateral:   9.57 cm/s LVIDs:          2.60 cm  LV E/e' lateral: 9.2 LV PW:          1.10 cm  LV e' medial:    8.27 cm/s LV IVS:         1.20 cm  LV E/e' medial:  10.7 LVOT diam:      2.00 cm LV SV:          34 ml LVOT Area:      3.14 cm  RIGHT VENTRICLE RV S prime:     11.60 cm/s TAPSE (M-mode): 2.3 cm RVSP:           26.7 mmHg  LEFT ATRIUM             Index       RIGHT ATRIUM          Index LA diam:        3.30 cm 1.77 cm/m  RA Pressure: 3 mmHg LA Vol (A2C):   44.6 ml 23.90 ml/m RA Area:     9.13 cm LA Vol (A4C):   29.3 ml 15.70 ml/m RA Volume:   18.50 ml 9.91 ml/m LA Biplane Vol: 35.6 ml 19.07 ml/m AORTIC VALVE LVOT Vmax:   98.30 cm/s LVOT Vmean:  64.400 cm/s LVOT VTI:    0.219 m  AORTA Ao Root diam: 3.20 cm Ao Asc diam:  3.00 cm  MITRAL VALVE  TR Peak grad: 23.7 mmHg MV Area (PHT): 2.45 cm    TR Vmax:      255.00 cm/s MV PHT:        89.90 msec  RVSP:         26.7 mmHg MV  Decel Time: 310 msec MV E velocity: 88.10 cm/s MV A velocity: 109.00 cm/s MV E/A ratio:  0.81  IVC IVC diam: 1.30 cm   Belva Crome MD Electronically signed by Belva Crome MD Signature Date/Time: 03/30/2018/2:13:03 PM    Final    MONITORS  LONG TERM MONITOR (3-14 DAYS) 07/20/2021  Narrative Patch Wear Time:  13 days and 23 hours (2023-05-02T13:00:13-0400 to 2023-05-16T12:46:19-0400)  Patient had a min HR of 37 bpm, max HR of 182 bpm, and avg HR of 74 bpm. Predominant underlying rhythm was Sinus Rhythm.  EVENTS: 1 run of Ventricular Tachycardia occurred lasting 13 beats with a max rate of 128 bpm (avg 108 bpm).  6 Supraventricular Tachycardia  runs occurred, the run with the fastest interval lasting 7 beats with a max rate of 182 bpm, the longest lasting 21.7 secs with an avg rate of 117 bpm.  One episode of second Degree AV Block-Mobitz I (Wenckebach) was present.  Isolated SVEs were rare (<1.0%), SVE  Couplets were rare (<1.0%), and SVE Triplets were rare (<1.0%). Isolated VEs were occasional (1.1%, [id number]), VE Couplets were rare (<1.0%, 3), and no VE Triplets were present. Ventricular Bigeminy and Trigeminy were present.  Patient triggered events corresponded to sinus rhythm supraventricular ectopic beats, and ventricular ectopic beats.  Overall results are reassuring without evidence of atrial fibrillation or sustained bradycardia or tachyarrhythmia.       ______________________________________________________________________________________________      Risk Assessment/Calculations:             Physical Exam:   VS:  BP 120/70 (BP Location: Right Arm, Patient Position: Sitting, Cuff Size: Normal)   Pulse 68   Ht 5\' 7"  (1.702 m)   Wt 161 lb (73 kg)   SpO2 98%   BMI 25.22 kg/m    Wt Readings from Last 3 Encounters:  05/03/23 161 lb (73 kg)  03/21/23 167 lb 1.9 oz (75.8 kg)  01/24/23 172 lb 6.4 oz (78.2 kg)    GEN: Well nourished, well developed in no  acute distress NECK: No JVD; No carotid bruits CARDIAC: RRR, no murmurs, rubs, gallops RESPIRATORY:  Clear to auscultation without rales, wheezing or rhonchi  ABDOMEN: Soft, non-tender, non-distended EXTREMITIES:  No edema; No deformity   ASSESSMENT AND PLAN: .   DOE-previous had an echo in 2020 which revealed diastolic dysfunction but overall normal.  Will repeat echocardiogram for any contributory causes.  Dizziness-she feels these episodes typically occur when she is anxious, her PCP is started on a low-dose of Lexapro approximately 2 weeks ago when she does feel like it is helping somewhat.  Coronary artery calcification-noted on CT imaging, Stable with no anginal symptoms. No indication for ischemic evaluation.  Continue metoprolol 25 mg half tablet once daily. Heart healthy diet and regular cardiovascular exercise encouraged.    Dyslipidemia-this appears to be monitored formally by her PCP, not currently on a lipid-lowering agent.  Palpitations-previous monitor revealed episode of VT and SVT, currently quiescent, continue low-dose metoprolol 25 mg half tablet daily.  Colon cancer-followed by the cancer center, colonoscopy for surveillance.  Preoperative cardiovascular evaluation - According to the Revised Cardiac Risk Index (RCRI), her Perioperative Risk of Major Cardiac Event is (%): 0.4 Her Functional Capacity in  METs is: 5.07 according to the Duke Activity Status Index (DASI). Therefore, based on ACC/AHA guidelines, patient would be at acceptable risk for the planned procedure without further cardiovascular testing. I will route this recommendation to the requesting party via Epic fax function.        Dispo: Echocardiogram--this is not need to be completed prior to her undergoing her colonoscopy, follow-up in 9 months with Dr. Tomie China.  Signed, Flossie Dibble, NP

## 2023-05-03 ENCOUNTER — Ambulatory Visit: Payer: Self-pay | Attending: Cardiology | Admitting: Cardiology

## 2023-05-03 ENCOUNTER — Encounter: Payer: Self-pay | Admitting: Cardiology

## 2023-05-03 VITALS — BP 120/70 | HR 68 | Ht 67.0 in | Wt 161.0 lb

## 2023-05-03 DIAGNOSIS — E785 Hyperlipidemia, unspecified: Secondary | ICD-10-CM | POA: Diagnosis not present

## 2023-05-03 DIAGNOSIS — I2510 Atherosclerotic heart disease of native coronary artery without angina pectoris: Secondary | ICD-10-CM | POA: Diagnosis not present

## 2023-05-03 DIAGNOSIS — R002 Palpitations: Secondary | ICD-10-CM

## 2023-05-03 DIAGNOSIS — R0609 Other forms of dyspnea: Secondary | ICD-10-CM | POA: Diagnosis not present

## 2023-05-03 DIAGNOSIS — Z01818 Encounter for other preprocedural examination: Secondary | ICD-10-CM

## 2023-05-03 DIAGNOSIS — I4729 Other ventricular tachycardia: Secondary | ICD-10-CM

## 2023-05-03 NOTE — Patient Instructions (Addendum)
 Medication Instructions:  Your physician recommends that you continue on your current medications as directed. Please refer to the Current Medication list given to you today.  *If you need a refill on your cardiac medications before your next appointment, please call your pharmacy*   Lab Work: None Ordered If you have labs (blood work) drawn today and your tests are completely normal, you will receive your results only by: MyChart Message (if you have MyChart) OR A paper copy in the mail If you have any lab test that is abnormal or we need to change your treatment, we will call you to review the results.   Testing/Procedures: Your physician has requested that you have an echocardiogram. Echocardiography is a painless test that uses sound waves to create images of your heart. It provides your doctor with information about the size and shape of your heart and how well your heart's chambers and valves are working. This procedure takes approximately one hour. There are no restrictions for this procedure. Please do NOT wear cologne, perfume, aftershave, or lotions (deodorant is allowed). Please arrive 15 minutes prior to your appointment time.  Please note: We ask at that you not bring children with you during ultrasound (echo/ vascular) testing. Due to room size and safety concerns, children are not allowed in the ultrasound rooms during exams. Our front office staff cannot provide observation of children in our lobby area while testing is being conducted. An adult accompanying a patient to their appointment will only be allowed in the ultrasound room at the discretion of the ultrasound technician under special circumstances. We apologize for any inconvenience.    Follow-Up: At [facility], you and your health needs are our priority.  As part of our continuing mission to provide you with exceptional heart care, we have created designated Provider Care Teams.  These Care Teams include your  primary Cardiologist (physician) and Advanced Practice Providers (APPs -  Physician Assistants and Nurse Practitioners) who all work together to provide you with the care you need, when you need it.  We recommend signing up for the patient portal called "MyChart".  Sign up information is provided on this After Visit Summary.  MyChart is used to connect with patients for Virtual Visits (Telemedicine).  Patients are able to view lab/test results, encounter notes, upcoming appointments, etc.  Non-urgent messages can be sent to your provider as well.   To learn more about what you can do with MyChart, go to [email or web address].    Your next appointment:   9 month(s)  The format for your next appointment:   In Person  Provider:   Wallis Bamberg, NP   Other Instructions NA

## 2023-05-04 ENCOUNTER — Inpatient Hospital Stay

## 2023-05-04 ENCOUNTER — Inpatient Hospital Stay: Payer: Self-pay | Admitting: Hematology and Oncology

## 2023-05-04 ENCOUNTER — Inpatient Hospital Stay: Payer: Self-pay

## 2023-05-04 ENCOUNTER — Inpatient Hospital Stay: Attending: Oncology

## 2023-05-04 ENCOUNTER — Encounter: Payer: Self-pay | Admitting: Hematology and Oncology

## 2023-05-04 ENCOUNTER — Inpatient Hospital Stay (HOSPITAL_BASED_OUTPATIENT_CLINIC_OR_DEPARTMENT_OTHER): Admitting: Hematology and Oncology

## 2023-05-04 VITALS — BP 110/48 | HR 77 | Temp 98.2°F | Resp 20 | Ht 67.0 in | Wt 161.1 lb

## 2023-05-04 DIAGNOSIS — E538 Deficiency of other specified B group vitamins: Secondary | ICD-10-CM

## 2023-05-04 DIAGNOSIS — D539 Nutritional anemia, unspecified: Secondary | ICD-10-CM

## 2023-05-04 DIAGNOSIS — Z85038 Personal history of other malignant neoplasm of large intestine: Secondary | ICD-10-CM

## 2023-05-04 DIAGNOSIS — R970 Elevated carcinoembryonic antigen [CEA]: Secondary | ICD-10-CM | POA: Diagnosis not present

## 2023-05-04 DIAGNOSIS — C184 Malignant neoplasm of transverse colon: Secondary | ICD-10-CM

## 2023-05-04 DIAGNOSIS — D649 Anemia, unspecified: Secondary | ICD-10-CM | POA: Insufficient documentation

## 2023-05-04 LAB — IRON AND TIBC
Iron: 105 ug/dL (ref 28–170)
Saturation Ratios: 27 % (ref 10.4–31.8)
TIBC: 388 ug/dL (ref 250–450)
UIBC: 283 ug/dL

## 2023-05-04 LAB — RETICULOCYTES
Immature Retic Fract: 4.9 % (ref 2.3–15.9)
RBC.: 3.74 MIL/uL — ABNORMAL LOW (ref 3.87–5.11)
Retic Count, Absolute: 47.9 10*3/uL (ref 19.0–186.0)
Retic Ct Pct: 1.3 % (ref 0.4–3.1)

## 2023-05-04 LAB — CBC WITH DIFFERENTIAL (CANCER CENTER ONLY)
Abs Immature Granulocytes: 0.02 10*3/uL (ref 0.00–0.07)
Basophils Absolute: 0.1 10*3/uL (ref 0.0–0.1)
Basophils Relative: 1 %
Eosinophils Absolute: 0.3 10*3/uL (ref 0.0–0.5)
Eosinophils Relative: 4 %
HCT: 33.9 % — ABNORMAL LOW (ref 36.0–46.0)
Hemoglobin: 11.6 g/dL — ABNORMAL LOW (ref 12.0–15.0)
Immature Granulocytes: 0 %
Lymphocytes Relative: 26 %
Lymphs Abs: 1.6 10*3/uL (ref 0.7–4.0)
MCH: 30.3 pg (ref 26.0–34.0)
MCHC: 34.2 g/dL (ref 30.0–36.0)
MCV: 88.5 fL (ref 80.0–100.0)
Monocytes Absolute: 0.6 10*3/uL (ref 0.1–1.0)
Monocytes Relative: 9 %
Neutro Abs: 3.7 10*3/uL (ref 1.7–7.7)
Neutrophils Relative %: 60 %
Platelet Count: 194 10*3/uL (ref 150–400)
RBC: 3.83 MIL/uL — ABNORMAL LOW (ref 3.87–5.11)
RDW: 13.2 % (ref 11.5–15.5)
WBC Count: 6.3 10*3/uL (ref 4.0–10.5)
nRBC: 0 % (ref 0.0–0.2)
nRBC: 0 /100{WBCs}

## 2023-05-04 LAB — CMP (CANCER CENTER ONLY)
ALT: 11 U/L (ref 0–44)
AST: 23 U/L (ref 15–41)
Albumin: 4.3 g/dL (ref 3.5–5.0)
Alkaline Phosphatase: 59 U/L (ref 38–126)
Anion gap: 10 (ref 5–15)
BUN: 20 mg/dL (ref 8–23)
CO2: 25 mmol/L (ref 22–32)
Calcium: 10.3 mg/dL (ref 8.9–10.3)
Chloride: 105 mmol/L (ref 98–111)
Creatinine: 1.52 mg/dL — ABNORMAL HIGH (ref 0.44–1.00)
GFR, Estimated: 34 mL/min — ABNORMAL LOW (ref >60)
Glucose, Bld: 115 mg/dL — ABNORMAL HIGH (ref 70–99)
Potassium: 3.8 mmol/L (ref 3.5–5.1)
Sodium: 140 mmol/L (ref 135–145)
Total Bilirubin: 0.5 mg/dL (ref 0.0–1.2)
Total Protein: 6.6 g/dL (ref 6.5–8.1)

## 2023-05-04 LAB — FERRITIN: Ferritin: 89 ng/mL (ref 11–307)

## 2023-05-04 LAB — CEA (ACCESS): CEA (CHCC): 4.8 ng/mL (ref 0.00–5.00)

## 2023-05-04 MED ORDER — CYANOCOBALAMIN 1000 MCG/ML IJ SOLN
1000.0000 ug | Freq: Once | INTRAMUSCULAR | Status: AC
  Administered 2023-05-04: 1000 ug via INTRAMUSCULAR
  Filled 2023-05-04: qty 1

## 2023-05-04 NOTE — Patient Instructions (Signed)
 B12 inj Vitamin B12 Injection What is this medication? Vitamin B12 (VAHY tuh min B12) prevents and treats low vitamin B12 levels in your body. It is used in people who do not get enough vitamin B12 from their diet or when their digestive tract does not absorb enough. Vitamin B12 plays an important role in maintaining the health of your nervous system and red blood cells. This medicine may be used for other purposes; ask your health care provider or pharmacist if you have questions. COMMON BRAND NAME(S): B-12 Compliance Kit, B-12 Injection Kit, Cyomin, Dodex, LA-12, Nutri-Twelve, Physicians EZ Use B-12, Primabalt, Vitamin Deficiency Injectable System - B12 What should I tell my care team before I take this medication? They need to know if you have any of these conditions: Kidney disease Leber's disease Megaloblastic anemia An unusual or allergic reaction to cyanocobalamin, cobalt, other medications, foods, dyes, or preservatives Pregnant or trying to get pregnant Breast-feeding How should I use this medication? This medication is injected into a muscle or deeply under the skin. It is usually given in a clinic or care team's office. However, your care team may teach you how to inject yourself. Follow all instructions. Talk to your care team about the use of this medication in children. Special care may be needed. Overdosage: If you think you have taken too much of this medicine contact a poison control center or emergency room at once. NOTE: This medicine is only for you. Do not share this medicine with others. What if I miss a dose? If you are given your dose at a clinic or care team's office, call to reschedule your appointment. If you give your own injections, and you miss a dose, take it as soon as you can. If it is almost time for your next dose, take only that dose. Do not take double or extra doses. What may interact with this medication? Alcohol Colchicine This list may not describe all  possible interactions. Give your health care provider a list of all the medicines, herbs, non-prescription drugs, or dietary supplements you use. Also tell them if you smoke, drink alcohol, or use illegal drugs. Some items may interact with your medicine. What should I watch for while using this medication? Visit your care team regularly. You may need blood work done while you are taking this medication. You may need to follow a special diet. Talk to your care team. Limit your alcohol intake and avoid smoking to get the best benefit. What side effects may I notice from receiving this medication? Side effects that you should report to your care team as soon as possible: Allergic reactions--skin rash, itching, hives, swelling of the face, lips, tongue, or throat Swelling of the ankles, hands, or feet Trouble breathing Side effects that usually do not require medical attention (report to your care team if they continue or are bothersome): Diarrhea This list may not describe all possible side effects. Call your doctor for medical advice about side effects. You may report side effects to FDA at 1-800-FDA-1088. Where should I keep my medication? Keep out of the reach of children. Store at room temperature between 15 and 30 degrees C (59 and 85 degrees F). Protect from light. Throw away any unused medication after the expiration date. NOTE: This sheet is a summary. It may not cover all possible information. If you have questions about this medicine, talk to your doctor, pharmacist, or health care provider.  2024 Elsevier/Gold Standard (2020-10-28 00:00:00)

## 2023-05-04 NOTE — Progress Notes (Signed)
 [facility]  [address] [place],  [place]  [address] [phone]  Clinic Day:  05/04/2023  Referring physician: Blane Ohara, MD  ASSESSMENT & PLAN:   Assessment & Plan: B12 deficiency Deficiency diagnosed in October 2018.  She remains on B12 injections monthly.  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  She was treated with surgical resection with findings of a 3.5 cm adenocarcinoma with 12 negative nodes.  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  Due to her stage of disease, she did not require adjuvant chemotherapy.  She has had intermittent mild elevation of the CEA of uncertain etiology.  Repeat imaging over the years has never revealed any evidence of recurrence.  Colonoscopy in January 2021 did not reveal any evidence of malignancy.  CT chest abdomen and pelvis in January 2023 did not reveal any evidence of malignancy.  Her CEA went up to 6.18 in November 2024 which was the highest it has ever been.  She is a non-smoker.  Repeat CT chest, abdomen and pelvis in December 2024 did not reveal any evidence of malignancy.  She was referred to Dr. Georgiana Shore for consideration of colonoscopy.  She was awaiting cardiac clearance and has colonoscopy scheduled for March 13.  She is having diarrhea, which she attributes to COVID.  She is otherwise doing well.  CEA is normal today.  I will have her see Dr. Gilman Buttner in 3 months with a CBC, comprehensive metabolic panel and CEA for closer follow-up.     The patient understands the plans discussed today and is in agreement with them.  She knows to contact our office if she develops concerns prior to her next appointment.   I provided 20 minutes of face-to-face time during this encounter and > 50% was spent counseling as documented under my assessment and plan.    Rebecca Perl, PA-C  [facility] [place] - A DEPT OF [facility] [address] [place] [address] Dept: [phone] Dept Fax: [phone]   No orders of the defined types were placed in this encounter.     CHIEF COMPLAINT:  CC: Personal history of stage I colon cancer with elevated CEA  Current Treatment: Surveillance  HISTORY OF PRESENT ILLNESS:   Oncology History  Malignant neoplasm of transverse colon (HCC) (Resolved)  07/16/2015 Cancer Staging   Staging form: Colon and Rectum, AJCC 7th Edition - Clinical stage from 07/16/2015: Stage I (T2, N0, M0) - Signed by Dellia Beckwith, MD on 01/30/2022 Staged by: Managing physician Diagnostic confirmation: Positive histology Specimen type: Excision Histopathologic type: Adenocarcinoma, NOS Stage prefix: Initial diagnosis Laterality: Right Tumor size (mm): 35 Histologic grade (G): G1 Lymph-vascular invasion (LVI): LVI not present (absent)/not identified Residual tumor (R): R0 - None Tumor deposits (TD): Absent Perineural invasion (PNI): Absent Microsatellite instability (MSI): Unstable high KRAS gene analysis: Not assessed Prognostic indicators: MMR abnormal, BRAF V600E mutation positive, 12 neg nodes Stage used in treatment planning: Yes National guidelines used in treatment planning: Yes Type of national guideline used in treatment planning: NCCN   07/22/2015 Initial Diagnosis   Malignant neoplasm of transverse colon (HCC)       INTERVAL HISTORY:  Rebecca Orr is here today for repeat clinical assessment.  At her visit in November 2024, her CEA was elevated at 6.18, the highest it has ever  been.  She also had worsening anemia, no nutritional deficiency was found.  Repeat CT chest, abdomen and pelvis in December did not reveal any evidence of malignancy.  She saw Dr. Georgiana Shore and received surgical clearance from cardiology, so her colonoscopy is scheduled next week.  She had COVID in February.  She states she has had  some mild diarrhea since then.  Her other symptoms resolved.  She is planning to start a probiotic.  She still has episodes of feeling weak and dizzy, which seems to be related to her blood pressure.  There is some question as to whether or not the episodes could be due to anxiety and she was started on Lexapro last month.  Her last episode was prior to her COVID diagnosis.  She continues to follow with cardiology and was seen yesterday.  Her diastolic blood pressure is low today and her systolic pressure dropped slightly with standing.  She is not symptomatic today..  I recommended she follow-up with cardiology if she has recurrent episodes of weakness and dizziness this.  She denies fevers or chills. She denies pain. Her appetite is back to normal after COVID, but her weight has decreased 9 pounds over last 3 months .  She states Dr. Mathis Bud started high dose vitamin D weekly, in addition to calcium/vitamin D twice daily.  Her vitamin D level was low at 15.9.  REVIEW OF SYSTEMS:  Review of Systems  Constitutional:  Negative for appetite change, chills, fatigue, fever and unexpected weight change.  HENT:   Negative for lump/mass, mouth sores and sore throat.   Respiratory:  Negative for cough and shortness of breath.   Cardiovascular:  Negative for chest pain and leg swelling.  Gastrointestinal:  Negative for abdominal pain, constipation, diarrhea, nausea and vomiting.  Endocrine: Negative for hot flashes.  Genitourinary:  Negative for difficulty urinating, dysuria, frequency and hematuria.   Musculoskeletal:  Negative for arthralgias, back pain and myalgias.  Skin:  Negative for rash.  Neurological:  Negative for dizziness and headaches.  Hematological:  Negative for adenopathy. Does not bruise/bleed easily.  Psychiatric/Behavioral:  Negative for depression and sleep disturbance. The patient is not nervous/anxious.      VITALS:  Blood pressure (!) 110/48, pulse 77, temperature 98.2 F (36.8 C),  temperature source Oral, resp. rate 20, height 5\' 7"  (1.702 m), weight 161 lb 1.6 oz (73.1 kg), SpO2 97%.  Wt Readings from Last 3 Encounters:  05/04/23 161 lb 1.6 oz (73.1 kg)  05/03/23 161 lb (73 kg)  03/21/23 167 lb 1.9 oz (75.8 kg)    Body mass index is 25.23 kg/m.  Performance status (ECOG): 0 - Asymptomatic  PHYSICAL EXAM:  Physical Exam Vitals and nursing note reviewed.  Constitutional:      General: She is not in acute distress.    Appearance: Normal appearance.  HENT:     Head: Normocephalic and atraumatic.     Mouth/Throat:     Mouth: Mucous membranes are moist.     Pharynx: Oropharynx is clear. No oropharyngeal exudate or posterior oropharyngeal erythema.  Eyes:     General: No scleral icterus.    Extraocular Movements: Extraocular movements intact.     Conjunctiva/sclera: Conjunctivae normal.     Pupils: Pupils are equal, round, and reactive to light.  Cardiovascular:     Rate and Rhythm: Normal rate and regular rhythm.     Heart sounds: Normal heart sounds. No murmur heard.    No friction rub. No gallop.  Pulmonary:  Effort: Pulmonary effort is normal.     Breath sounds: Normal breath sounds. No wheezing, rhonchi or rales.  Abdominal:     General: There is no distension.     Palpations: Abdomen is soft. There is no hepatomegaly, splenomegaly or mass.     Tenderness: There is no abdominal tenderness.  Musculoskeletal:        General: Normal range of motion.     Cervical back: Normal range of motion and neck supple. No tenderness.     Right lower leg: No edema.     Left lower leg: No edema.  Lymphadenopathy:     Cervical: No cervical adenopathy.     Upper Body:     Right upper body: No supraclavicular or axillary adenopathy.     Left upper body: No supraclavicular or axillary adenopathy.     Lower Body: No right inguinal adenopathy. No left inguinal adenopathy.  Skin:    General: Skin is warm and dry.     Coloration: Skin is not jaundiced.      Findings: No rash.  Neurological:     Mental Status: She is alert and oriented to person, place, and time.     Cranial Nerves: No cranial nerve deficit.  Psychiatric:        Mood and Affect: Mood normal.        Behavior: Behavior normal.        Thought Content: Thought content normal.     LABS:      Latest Ref Rng & Units 05/04/2023    1:20 PM 01/24/2023    9:52 AM 05/21/2022   11:24 AM  CBC  WBC 4.0 - 10.5 K/uL 6.3  4.7  3.7   Hemoglobin 12.0 - 15.0 g/dL 78.2  95.6  21.3   Hematocrit 36.0 - 46.0 % 33.9  32.6  35.0   Platelets 150 - 400 K/uL 194  156  167       Latest Ref Rng & Units 05/04/2023    1:20 PM 01/24/2023    9:52 AM 05/21/2022   11:24 AM  CMP  Glucose 70 - 99 mg/dL 086  578  469   BUN 8 - 23 mg/dL 20  23  22    Creatinine 0.44 - 1.00 mg/dL 6.29  5.28  4.13   Sodium 135 - 145 mmol/L 140  142  145   Potassium 3.5 - 5.1 mmol/L 3.8  4.2  4.8   Chloride 98 - 111 mmol/L 105  108  106   CO2 22 - 32 mmol/L 25  25  22    Calcium 8.9 - 10.3 mg/dL 24.4  9.9  9.6   Total Protein 6.5 - 8.1 g/dL 6.6  6.7  6.7   Total Bilirubin 0.0 - 1.2 mg/dL 0.5  0.4  0.4   Alkaline Phos 38 - 126 U/L 59  66  77   AST 15 - 41 U/L 23  25  21    ALT 0 - 44 U/L 11  11  13       Lab Results  Component Value Date   CEA1 5.1 (H) 01/12/2022   CEA 4.80 05/04/2023   /  CEA  Date Value Ref Range Status  01/12/2022 5.1 (H) 0.0 - 4.7 ng/mL Final    Comment:    (NOTE)                             Nonsmokers          <  3.9                             Smokers             <5.6 Roche [employer]ostics Electrochemiluminescence Immunoassay (ECLIA) Values obtained with different assay methods or kits cannot be used interchangeably.  Results cannot be interpreted as absolute evidence of the presence or absence of malignant disease. Performed At: [facility] [address] [place], [place] [id number] Jolene Schimke MD FI:[id number]    CEA [facility])  Date Value Ref Range Status  05/04/2023 4.80 0.00  - 5.00 ng/mL Final    Comment:    (NOTE) This test was performed using Beckman Coulter's paramagnetic chemiluminescent immunoassay. Values obtained from different assay methods cannot be used interchangeably. Please note that up to 8% of patients who smoke may see values 5.1-10.0 ng/ml and 1% of patients who smoke may see CEA levels >10.0 ng/ml. Performed at [facility], [address] [place], [place] [address]     Lab Results  Component Value Date   TIBC 388 05/04/2023   TIBC 417 01/24/2023   FERRITIN 89 05/04/2023   FERRITIN 32 01/24/2023   FERRITIN 28 08/20/2019   IRONPCTSAT 27 05/04/2023   IRONPCTSAT 22 01/24/2023     STUDIES:    Exam(s): [id number] CT/CT CHEST-ABD-PELV W/IV CM  EXAM:  CT CHEST-ABD-PELV W/IV CM  CLINICAL HISTORY:  Z85.038/R97.0  COMPARISON:  03/30/2021, 04/22/2020.  TECHNIQUE:  Routine CT of the chest, abdomen, and pelvis after administration of intravenous contrast. Multi planer reformatted images available for review.  Dose reduction techniques were used including intermediate exposure control (AEC),iterative reconstruction technique, and/or mA and/or KV dose adjustments based on patient's size.  FINDINGS:  The structures at the base of the neck are unremarkable. 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 The aorta maintains normal caliber, contour and, and course. There is mild aortic and branch vessel atherosclerosis. The heart is nonenlarged and there is no pericardial disease. The pulmonary trunk measures 3.1 cm. There is no pleural disease. Axillary, mediastinal, hilar, and retrocrural lymph nodes are normal size.  The trachea and bronchi are patent. The lungs are well-expanded. There is a calcified right middle lobe granuloma. 3 mm right lower lobe pulmonary nodule, image 33, series 301, stable.  The gallbladder is surgically absent. There is diffuse pancreatic parenchymal atrophy. The liver, and adrenal glands  are unremarkable. There is a 2.6 x 2.8 cm hypoattenuating (56 Hounsfield units) mass within the central spleen, unchanged in appearance when compared to examination 04/22/2020. 13 mm right adrenal nodule is stable.  The kidneys, ureters, and bladder are unremarkable. There is a 2.7 cm left adnexal cyst.  There is aortic atherosclerosis. The aorta maintains normal caliber and contour. The stomach, duodenum, distal small intestine, and colon demonstrate no significant abnormality. The appendix is not visualized. There is sigmoid diverticulosis coli.  Degenerative changes of the thoracolumbar spine. The bones demonstrate diffuse osteopenia.  IMPRESSION No evidence of local recurrence or metastatic disease.  2.2 cm splenic mass unchanged since 04/22/2020.  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   Ingrown right big toenail 05/23/2022   Irregular heart beats 06/28/2021   Mixed hyperlipidemia 01/01/2021   NSVT (nonsustained ventricular tachycardia) (HCC) 08/07/2021   Pain of lower extremity 05/20/2020   Personal history of colon cancer, stage I 07/18/2015   Transient ischemic attack    Weakness 06/28/2021    Past Surgical History:  Procedure Laterality Date   CATARACT EXTRACTION     CHOLECYSTECTOMY     HEMICOLECTOMY  2017   SPHINCTEROTOMY  2011    Family History  Problem Relation Age of Onset   Dementia Mother     Lung cancer Father    Heart disease Father    Renal cancer Sister    Prostate cancer Brother    Breast cancer Paternal Grandmother     Social History:  reports that she has never smoked. She has never used smokeless tobacco. She reports that she does not drink alcohol and does not use drugs.The patient is alone today.  Allergies:  Allergies  Allergen Reactions   Nsaids Other (See Comments)    GI Bleed    Aspirin Other (See Comments)   Atenolol Other (See Comments)   Naproxen Other (See Comments)   Propranolol Other (See Comments)    Unknown   Tizanidine Other (See Comments)    Dizziness     Current Medications: Current Outpatient Medications  Medication Sig Dispense Refill   Calcium Carbonate-Vitamin D (CALCIUM-VITAMIN D PO) Take 1 tablet by mouth 2 (two) times daily.     ciclopirox (PENLAC) 8 % solution Apply topically at bedtime. Apply over nail and surrounding skin. Apply daily over previous coat. After seven (7) days, may remove with alcohol and continue cycle. 6.6 mL 3   Cyanocobalamin (B-12 IJ) Inject as directed every 30 (thirty) days.     escitalopram (LEXAPRO) 5 MG tablet Take 1 tablet (5 mg total) by mouth daily. 30 tablet 1   famotidine (PEPCID) 40 MG tablet Take 40 mg by mouth daily.     Glucosamine Sulfate 500 MG TABS Take 500 mg by mouth daily.     metoprolol succinate (TOPROL XL) 25 MG 24 hr tablet Take 0.5 tablets (12.5 mg total) by mouth daily. 45 tablet 2   Vitamin D, Ergocalciferol, (DRISDOL) 1.25 MG (50000 UNIT) CAPS capsule Take 50,000 Units by mouth every 7 (seven) days.     Current Facility-Administered Medications  Medication Dose Route Frequency Provider Last Rate Last Admin   triamcinolone acetonide (KENALOG-40) injection 80 mg  80 mg Intra-articular Once Blane Ohara, MD

## 2023-05-05 ENCOUNTER — Encounter: Payer: Self-pay | Admitting: Hematology and Oncology

## 2023-05-05 ENCOUNTER — Other Ambulatory Visit: Payer: Self-pay | Admitting: Hematology and Oncology

## 2023-05-05 ENCOUNTER — Ambulatory Visit: Attending: Cardiology

## 2023-05-05 ENCOUNTER — Telehealth: Payer: Self-pay

## 2023-05-05 ENCOUNTER — Telehealth: Payer: Self-pay | Admitting: Cardiology

## 2023-05-05 DIAGNOSIS — R970 Elevated carcinoembryonic antigen [CEA]: Secondary | ICD-10-CM

## 2023-05-05 DIAGNOSIS — I10 Essential (primary) hypertension: Secondary | ICD-10-CM

## 2023-05-05 DIAGNOSIS — Z85038 Personal history of other malignant neoplasm of large intestine: Secondary | ICD-10-CM

## 2023-05-05 DIAGNOSIS — E538 Deficiency of other specified B group vitamins: Secondary | ICD-10-CM

## 2023-05-05 NOTE — Telephone Encounter (Signed)
 Spoke with patient who states at times her BP is elevated she has weakness and feeling funny. Pt denies dizziness, chest pain/pressure, no radiation, no shob and no diaphoresis. Pt states that this has been on going since last summer but getting worse.

## 2023-05-05 NOTE — Telephone Encounter (Signed)
 Patient notified and wanting to know why the change in lab value.

## 2023-05-05 NOTE — Telephone Encounter (Signed)
 Recommendations reviewed and pt has a nurse visit scheduled today.

## 2023-05-05 NOTE — Assessment & Plan Note (Deleted)
 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  She was treated with surgical resection with findings of a 3.5 cm adenocarcinoma with 12 negative nodes.  She underwent cholecystectomy at that time due to chronic cholecystitis.  She has had intermittent elevation of the CEA of uncertain etiology.  Repeat imaging over the years has never revealed any evidence of recurrence.  Colonoscopy in January 2021 did not reveal any evidence of malignancy.    Her CEA went up to 6.18 in November 2024 which was the highest it has ever been.  She is a non-smoker.  CT chest, abdomen and pelvis did not reveal any evidence of malignancy.  She was referred to Dr. Georgiana Shore for consideration of colonoscopy.  She was awaiting cardiac clearance and has colonoscopy scheduled for March 15.  She is having diarrhea, which she attributes to COVID.  She is otherwise doing well.  CEA is normal today.  I will have her see Dr. Gilman Buttner in 3 months with a CBC, comprehensive metabolic panel and CEA for closer follow-up.

## 2023-05-05 NOTE — Telephone Encounter (Signed)
-----   Message from Adah Perl sent at 05/05/2023  7:50 AM EST ----- Please let her know her CEA is back to normal. Thanks

## 2023-05-05 NOTE — Progress Notes (Signed)
   Nurse Visit   Date of Encounter: 05/05/2023 ID: Rebecca Orr, DOB 1940-05-25, MRN [id number]  PCP:  Lucianne Lei, MD    [facility] Providers Cardiologist:  Garwin Brothers, MD      Visit Details   VS:  There were no vitals taken for this visit. , BMI There is no height or weight on file to calculate BMI.  Wt Readings from Last 3 Encounters:  05/04/23 161 lb 1.6 oz (73.1 kg)  05/03/23 161 lb (73 kg)  03/21/23 167 lb 1.9 oz (75.8 kg)     Reason for visit: BP check Performed today: Vitals- checked BP manual and with machine Changes (medications, testing, etc.) : Recommended new BP machine & BP log after getting new machine per JW Length of Visit: 15 minutes    Medications Adjustments/Labs and Tests Ordered: No orders of the defined types were placed in this encounter.  No orders of the defined types were placed in this encounter.    Signed, Neena Rhymes, RN  05/05/2023 1:51 PM

## 2023-05-05 NOTE — Assessment & Plan Note (Signed)
 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  She remains on B12 injections monthly.

## 2023-05-05 NOTE — Telephone Encounter (Signed)
 Pt c/o BP issue: STAT if pt c/o blurred vision, one-sided weakness or slurred speech  1. What are your last 5 BP readings?  175/88 HR 45 174/84 HR 41 150/72 HR 62 133/67 HR 70 - Most recent at 7:30 A.M.   2. Are you having any other symptoms (ex. Dizziness, headache, blurred vision, passed out)? Head feels funny and weakness   3. What is your BP issue? Patient states that she started feeling bad around 7:30 - 8 P.M. last night and took her BP and it read 175/88 HR 45. Was diagnosed with covid almost 3 weeks ago.

## 2023-05-05 NOTE — Assessment & Plan Note (Signed)
 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  She was treated with surgical resection with findings of a 3.5 cm adenocarcinoma with 12 negative nodes.  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  Due to her stage of disease, she did not require adjuvant chemotherapy.  She has had intermittent mild elevation of the CEA of uncertain etiology.  Repeat imaging over the years has never revealed any evidence of recurrence.  Colonoscopy in January 2021 did not reveal any evidence of malignancy.  CT chest abdomen and pelvis in January 2023 did not reveal any evidence of malignancy.  Her CEA went up to 6.18 in November 2024 which was the highest it has ever been.  She is a non-smoker.  Repeat CT chest, abdomen and pelvis in December 2024 did not reveal any evidence of malignancy.  She was referred to Dr. Georgiana Shore for consideration of colonoscopy.  She was awaiting cardiac clearance and has colonoscopy scheduled for March 13.  She is having diarrhea, which she attributes to COVID.  She is otherwise doing well.  CEA is normal today.  I will have her see Dr. Gilman Buttner in 3 months with a CBC, comprehensive metabolic panel and CEA for closer follow-up.

## 2023-05-06 LAB — HAPTOGLOBIN: Haptoglobin: 95 mg/dL (ref 41–333)

## 2023-05-11 ENCOUNTER — Other Ambulatory Visit (HOSPITAL_BASED_OUTPATIENT_CLINIC_OR_DEPARTMENT_OTHER): Payer: Self-pay

## 2023-05-16 ENCOUNTER — Ambulatory Visit: Payer: Medicare HMO

## 2023-05-19 ENCOUNTER — Encounter: Payer: Self-pay | Admitting: Hematology and Oncology

## 2023-05-19 ENCOUNTER — Other Ambulatory Visit (HOSPITAL_BASED_OUTPATIENT_CLINIC_OR_DEPARTMENT_OTHER): Payer: Self-pay

## 2023-05-19 DIAGNOSIS — M159 Polyosteoarthritis, unspecified: Secondary | ICD-10-CM | POA: Diagnosis not present

## 2023-05-19 DIAGNOSIS — J309 Allergic rhinitis, unspecified: Secondary | ICD-10-CM | POA: Diagnosis not present

## 2023-05-19 DIAGNOSIS — K219 Gastro-esophageal reflux disease without esophagitis: Secondary | ICD-10-CM | POA: Diagnosis not present

## 2023-05-19 DIAGNOSIS — M1711 Unilateral primary osteoarthritis, right knee: Secondary | ICD-10-CM | POA: Diagnosis not present

## 2023-05-19 DIAGNOSIS — I10 Essential (primary) hypertension: Secondary | ICD-10-CM | POA: Diagnosis not present

## 2023-05-19 DIAGNOSIS — D649 Anemia, unspecified: Secondary | ICD-10-CM | POA: Diagnosis not present

## 2023-05-19 DIAGNOSIS — Z85038 Personal history of other malignant neoplasm of large intestine: Secondary | ICD-10-CM | POA: Diagnosis not present

## 2023-05-19 DIAGNOSIS — E559 Vitamin D deficiency, unspecified: Secondary | ICD-10-CM | POA: Diagnosis not present

## 2023-05-19 DIAGNOSIS — K649 Unspecified hemorrhoids: Secondary | ICD-10-CM | POA: Diagnosis not present

## 2023-05-19 DIAGNOSIS — R2681 Unsteadiness on feet: Secondary | ICD-10-CM | POA: Diagnosis not present

## 2023-05-19 MED ORDER — ESCITALOPRAM OXALATE 5 MG PO TABS
5.0000 mg | ORAL_TABLET | Freq: Every day | ORAL | 0 refills | Status: DC
  Filled 2023-06-08: qty 90, 90d supply, fill #0

## 2023-05-19 MED ORDER — ERGOCALCIFEROL 1.25 MG (50000 UT) PO CAPS
50000.0000 [IU] | ORAL_CAPSULE | ORAL | 0 refills | Status: DC
  Filled 2023-05-19 – 2023-06-08 (×2): qty 12, 84d supply, fill #0

## 2023-05-19 MED ORDER — HYDROCORTISONE (PERIANAL) 2.5 % EX CREA
1.0000 | TOPICAL_CREAM | Freq: Two times a day (BID) | CUTANEOUS | 0 refills | Status: AC
Start: 2023-05-19 — End: 2023-06-04
  Filled 2023-05-19: qty 30, 15d supply, fill #0

## 2023-05-19 MED ORDER — HYDROCORTISONE ACETATE 25 MG RE SUPP
25.0000 mg | Freq: Two times a day (BID) | RECTAL | 0 refills | Status: AC
  Filled 2023-05-19: qty 24, 12d supply, fill #0

## 2023-05-19 MED ORDER — FAMOTIDINE 40 MG PO TABS
40.0000 mg | ORAL_TABLET | Freq: Every day | ORAL | 0 refills | Status: DC
  Filled 2023-05-19: qty 90, 90d supply, fill #0

## 2023-05-20 ENCOUNTER — Other Ambulatory Visit (HOSPITAL_BASED_OUTPATIENT_CLINIC_OR_DEPARTMENT_OTHER): Payer: Self-pay

## 2023-05-20 ENCOUNTER — Encounter: Payer: Self-pay | Admitting: Hematology and Oncology

## 2023-05-25 ENCOUNTER — Ambulatory Visit: Attending: Cardiology

## 2023-05-25 DIAGNOSIS — R0609 Other forms of dyspnea: Secondary | ICD-10-CM | POA: Diagnosis not present

## 2023-05-26 ENCOUNTER — Telehealth: Payer: Self-pay | Admitting: Cardiology

## 2023-05-26 LAB — ECHOCARDIOGRAM COMPLETE
Area-P 1/2: 3.45 cm2
MV M vel: 4.8 m/s
MV Peak grad: 92.2 mmHg
Radius: 0.4 cm
S' Lateral: 2.7 cm

## 2023-06-06 ENCOUNTER — Other Ambulatory Visit (HOSPITAL_BASED_OUTPATIENT_CLINIC_OR_DEPARTMENT_OTHER): Payer: Self-pay

## 2023-06-06 ENCOUNTER — Encounter: Payer: Self-pay | Admitting: Hematology and Oncology

## 2023-06-06 ENCOUNTER — Inpatient Hospital Stay: Payer: Self-pay | Attending: Oncology

## 2023-06-06 VITALS — BP 127/63 | HR 70 | Temp 98.0°F | Resp 18 | Ht 67.0 in | Wt 165.0 lb

## 2023-06-06 DIAGNOSIS — E538 Deficiency of other specified B group vitamins: Secondary | ICD-10-CM | POA: Diagnosis not present

## 2023-06-06 MED ORDER — CYANOCOBALAMIN 1000 MCG/ML IJ SOLN
1000.0000 ug | Freq: Once | INTRAMUSCULAR | Status: AC
  Administered 2023-06-06: 1000 ug via INTRAMUSCULAR
  Filled 2023-06-06: qty 1

## 2023-06-06 NOTE — Patient Instructions (Signed)
 Vitamin B12 Injection What is this medication? Vitamin B12 (VAHY tuh min B12) prevents and treats low vitamin B12 levels in your body. It is used in people who do not get enough vitamin B12 from their diet or when their digestive tract does not absorb enough. Vitamin B12 plays an important role in maintaining the health of your nervous system and red blood cells. This medicine may be used for other purposes; ask your health care provider or pharmacist if you have questions. COMMON BRAND NAME(S): B-12 Compliance Kit, B-12 Injection Kit, Cyomin, Dodex, LA-12, Nutri-Twelve, Physicians EZ Use B-12, Primabalt, Vitamin Deficiency Injectable System - B12 What should I tell my care team before I take this medication? They need to know if you have any of these conditions: Kidney disease Leber's disease Megaloblastic anemia An unusual or allergic reaction to cyanocobalamin, cobalt, other medications, foods, dyes, or preservatives Pregnant or trying to get pregnant Breast-feeding How should I use this medication? This medication is injected into a muscle or deeply under the skin. It is usually given in a clinic or care team's office. However, your care team may teach you how to inject yourself. Follow all instructions. Talk to your care team about the use of this medication in children. Special care may be needed. Overdosage: If you think you have taken too much of this medicine contact a poison control center or emergency room at once. NOTE: This medicine is only for you. Do not share this medicine with others. What if I miss a dose? If you are given your dose at a clinic or care team's office, call to reschedule your appointment. If you give your own injections, and you miss a dose, take it as soon as you can. If it is almost time for your next dose, take only that dose. Do not take double or extra doses. What may interact with this medication? Alcohol Colchicine This list may not describe all possible  interactions. Give your health care provider a list of all the medicines, herbs, non-prescription drugs, or dietary supplements you use. Also tell them if you smoke, drink alcohol, or use illegal drugs. Some items may interact with your medicine. What should I watch for while using this medication? Visit your care team regularly. You may need blood work done while you are taking this medication. You may need to follow a special diet. Talk to your care team. Limit your alcohol intake and avoid smoking to get the best benefit. What side effects may I notice from receiving this medication? Side effects that you should report to your care team as soon as possible: Allergic reactions--skin rash, itching, hives, swelling of the face, lips, tongue, or throat Swelling of the ankles, hands, or feet Trouble breathing Side effects that usually do not require medical attention (report to your care team if they continue or are bothersome): Diarrhea This list may not describe all possible side effects. Call your doctor for medical advice about side effects. You may report side effects to FDA at 1-800-FDA-1088. Where should I keep my medication? Keep out of the reach of children. Store at room temperature between 15 and 30 degrees C (59 and 85 degrees F). Protect from light. Throw away any unused medication after the expiration date. NOTE: This sheet is a summary. It may not cover all possible information. If you have questions about this medicine, talk to your doctor, pharmacist, or health care provider.  2024 Elsevier/Gold Standard (2020-10-28 00:00:00)

## 2023-06-07 ENCOUNTER — Other Ambulatory Visit (HOSPITAL_BASED_OUTPATIENT_CLINIC_OR_DEPARTMENT_OTHER): Payer: Self-pay

## 2023-06-08 ENCOUNTER — Other Ambulatory Visit (HOSPITAL_BASED_OUTPATIENT_CLINIC_OR_DEPARTMENT_OTHER): Payer: Self-pay

## 2023-06-10 ENCOUNTER — Encounter (HOSPITAL_BASED_OUTPATIENT_CLINIC_OR_DEPARTMENT_OTHER): Payer: Self-pay | Admitting: Family Medicine

## 2023-06-10 ENCOUNTER — Other Ambulatory Visit (HOSPITAL_BASED_OUTPATIENT_CLINIC_OR_DEPARTMENT_OTHER): Payer: Self-pay | Admitting: Internal Medicine

## 2023-06-10 DIAGNOSIS — Z1231 Encounter for screening mammogram for malignant neoplasm of breast: Secondary | ICD-10-CM

## 2023-06-13 ENCOUNTER — Encounter (HOSPITAL_BASED_OUTPATIENT_CLINIC_OR_DEPARTMENT_OTHER): Payer: Self-pay | Admitting: Radiology

## 2023-06-13 ENCOUNTER — Ambulatory Visit: Payer: Medicare HMO

## 2023-06-13 ENCOUNTER — Ambulatory Visit (HOSPITAL_BASED_OUTPATIENT_CLINIC_OR_DEPARTMENT_OTHER)
Admission: RE | Admit: 2023-06-13 | Discharge: 2023-06-13 | Disposition: A | Discharge status: Home / Self Care | Source: Ambulatory Visit | Attending: Internal Medicine | Admitting: Internal Medicine

## 2023-06-13 DIAGNOSIS — Z1231 Encounter for screening mammogram for malignant neoplasm of breast: Secondary | ICD-10-CM | POA: Diagnosis not present

## 2023-06-14 ENCOUNTER — Encounter: Payer: Self-pay | Admitting: Hematology and Oncology

## 2023-06-17 NOTE — Telephone Encounter (Signed)
 Patient made aware of recommendations. Will close encounter.

## 2023-06-20 ENCOUNTER — Encounter: Payer: Self-pay | Admitting: Hematology and Oncology

## 2023-06-20 DIAGNOSIS — C184 Malignant neoplasm of transverse colon: Secondary | ICD-10-CM | POA: Diagnosis not present

## 2023-06-20 DIAGNOSIS — D509 Iron deficiency anemia, unspecified: Secondary | ICD-10-CM | POA: Diagnosis not present

## 2023-06-21 ENCOUNTER — Encounter: Payer: Self-pay | Admitting: Podiatry

## 2023-06-21 ENCOUNTER — Ambulatory Visit: Payer: HMO | Admitting: Podiatry

## 2023-06-21 DIAGNOSIS — M79674 Pain in right toe(s): Secondary | ICD-10-CM

## 2023-06-21 DIAGNOSIS — B351 Tinea unguium: Secondary | ICD-10-CM

## 2023-06-21 DIAGNOSIS — M79675 Pain in left toe(s): Secondary | ICD-10-CM | POA: Diagnosis not present

## 2023-06-21 NOTE — Progress Notes (Signed)
  Subjective:  Patient ID: Rebecca Orr, female    DOB: September 20, 1940,  MRN: [id number]  Chief Complaint  Patient presents with   RFC    RFC with out callous. Not diabetic and no anti coag.     83 y.o. female presents with the above complaint. History confirmed with patient. Patient presenting with pain related to dystrophic thickened elongated nails. Patient is unable to trim own nails related to nail dystrophy and/or mobility issues. Patient does not have a history of T2DM.  Patient previously had ingrown nail removed on June 22, 2022, She does report some signs of regrowth but no significant pain at this point.  She has been using ciclopirox  topical antifungal medication since May 8.  Objective:  Physical Exam: warm, good capillary refill nail exam onychomycosis of the toenails, onycholysis, and dystrophic nails DP pulses palpable, PT pulses palpable, and protective sensation intact Left Foot:  Pain with palpation of nails due to elongation and dystrophic growth.  Some proximal nail clearing noted Right Foot: Pain with palpation of nails due to elongation and dystrophic growth.  Right first toenail is showing evidence of proximal clearance.  Some recurrence of right hallux medial border nail growth without pain today.  Assessment:   1. Pain due to onychomycosis of toenails of both feet      Plan:  Patient was evaluated and treated and all questions answered.   #Onychomycosis with pain  -Nails palliatively debrided as below. -Educated on self-care - She has had some improvement with the topical Penlac , has been using it for close to a year at this point.  Has 2/3 refills remaining - Monitor right hallux for signs of recurrent pain from prior ingrown toenail.  Previously had deferred toenail avulsion.  Procedure: Nail Debridement Rationale: Pain Type of Debridement: manual, sharp debridement. Instrumentation: Nail nipper, rotary burr. Number of Nails: 10  Return  in about 3 months (around 09/20/2023) for Routine Foot Care.         Eve Hinders, DPM [facility]

## 2023-06-23 DIAGNOSIS — K219 Gastro-esophageal reflux disease without esophagitis: Secondary | ICD-10-CM | POA: Diagnosis not present

## 2023-06-23 DIAGNOSIS — Z886 Allergy status to analgesic agent status: Secondary | ICD-10-CM | POA: Diagnosis not present

## 2023-06-23 DIAGNOSIS — Z79899 Other long term (current) drug therapy: Secondary | ICD-10-CM | POA: Diagnosis not present

## 2023-06-23 DIAGNOSIS — Z85038 Personal history of other malignant neoplasm of large intestine: Secondary | ICD-10-CM | POA: Diagnosis not present

## 2023-06-23 DIAGNOSIS — K5730 Diverticulosis of large intestine without perforation or abscess without bleeding: Secondary | ICD-10-CM | POA: Diagnosis not present

## 2023-06-23 DIAGNOSIS — D509 Iron deficiency anemia, unspecified: Secondary | ICD-10-CM | POA: Diagnosis not present

## 2023-06-23 DIAGNOSIS — Z8673 Personal history of transient ischemic attack (TIA), and cerebral infarction without residual deficits: Secondary | ICD-10-CM | POA: Diagnosis not present

## 2023-06-23 DIAGNOSIS — Z888 Allergy status to other drugs, medicaments and biological substances status: Secondary | ICD-10-CM | POA: Diagnosis not present

## 2023-06-23 DIAGNOSIS — I2510 Atherosclerotic heart disease of native coronary artery without angina pectoris: Secondary | ICD-10-CM | POA: Diagnosis not present

## 2023-06-23 DIAGNOSIS — K449 Diaphragmatic hernia without obstruction or gangrene: Secondary | ICD-10-CM | POA: Diagnosis not present

## 2023-06-23 LAB — HM COLONOSCOPY

## 2023-07-06 ENCOUNTER — Encounter: Payer: Self-pay | Admitting: Hematology and Oncology

## 2023-07-06 ENCOUNTER — Other Ambulatory Visit (HOSPITAL_BASED_OUTPATIENT_CLINIC_OR_DEPARTMENT_OTHER): Payer: Self-pay

## 2023-07-06 ENCOUNTER — Inpatient Hospital Stay: Payer: Self-pay | Attending: Oncology

## 2023-07-06 VITALS — BP 133/51 | HR 68 | Temp 98.2°F | Resp 18

## 2023-07-06 DIAGNOSIS — E538 Deficiency of other specified B group vitamins: Secondary | ICD-10-CM | POA: Diagnosis not present

## 2023-07-06 MED ORDER — CYANOCOBALAMIN 1000 MCG/ML IJ SOLN
1000.0000 ug | Freq: Once | INTRAMUSCULAR | Status: AC
  Administered 2023-07-06: 1000 ug via INTRAMUSCULAR
  Filled 2023-07-06: qty 1

## 2023-07-06 NOTE — Patient Instructions (Signed)
 Vitamin B12 Injection What is this medication? Vitamin B12 (VAHY tuh min B12) prevents and treats low vitamin B12 levels in your body. It is used in people who do not get enough vitamin B12 from their diet or when their digestive tract does not absorb enough. Vitamin B12 plays an important role in maintaining the health of your nervous system and red blood cells. This medicine may be used for other purposes; ask your health care provider or pharmacist if you have questions. COMMON BRAND NAME(S): B-12 Compliance Kit, B-12 Injection Kit, Cyomin, Dodex, LA-12, Nutri-Twelve, Physicians EZ Use B-12, Primabalt, Vitamin Deficiency Injectable System - B12 What should I tell my care team before I take this medication? They need to know if you have any of these conditions: Kidney disease Leber's disease Megaloblastic anemia An unusual or allergic reaction to cyanocobalamin, cobalt, other medications, foods, dyes, or preservatives Pregnant or trying to get pregnant Breast-feeding How should I use this medication? This medication is injected into a muscle or deeply under the skin. It is usually given in a clinic or care team's office. However, your care team may teach you how to inject yourself. Follow all instructions. Talk to your care team about the use of this medication in children. Special care may be needed. Overdosage: If you think you have taken too much of this medicine contact a poison control center or emergency room at once. NOTE: This medicine is only for you. Do not share this medicine with others. What if I miss a dose? If you are given your dose at a clinic or care team's office, call to reschedule your appointment. If you give your own injections, and you miss a dose, take it as soon as you can. If it is almost time for your next dose, take only that dose. Do not take double or extra doses. What may interact with this medication? Alcohol Colchicine This list may not describe all possible  interactions. Give your health care provider a list of all the medicines, herbs, non-prescription drugs, or dietary supplements you use. Also tell them if you smoke, drink alcohol, or use illegal drugs. Some items may interact with your medicine. What should I watch for while using this medication? Visit your care team regularly. You may need blood work done while you are taking this medication. You may need to follow a special diet. Talk to your care team. Limit your alcohol intake and avoid smoking to get the best benefit. What side effects may I notice from receiving this medication? Side effects that you should report to your care team as soon as possible: Allergic reactions--skin rash, itching, hives, swelling of the face, lips, tongue, or throat Swelling of the ankles, hands, or feet Trouble breathing Side effects that usually do not require medical attention (report to your care team if they continue or are bothersome): Diarrhea This list may not describe all possible side effects. Call your doctor for medical advice about side effects. You may report side effects to FDA at 1-800-FDA-1088. Where should I keep my medication? Keep out of the reach of children. Store at room temperature between 15 and 30 degrees C (59 and 85 degrees F). Protect from light. Throw away any unused medication after the expiration date. NOTE: This sheet is a summary. It may not cover all possible information. If you have questions about this medicine, talk to your doctor, pharmacist, or health care provider.  2024 Elsevier/Gold Standard (2020-10-28 00:00:00)

## 2023-07-11 ENCOUNTER — Ambulatory Visit: Payer: Medicare HMO

## 2023-08-04 NOTE — Progress Notes (Deleted)
 [facility]  [address] [place],  [place]  [address] [phone]  Clinic Day:  08/04/2023  Referring physician: Tamela Fake, MD  ASSESSMENT & PLAN:   Assessment & Plan: No problem-specific Assessment & Plan notes found for this encounter.    The patient understands the plans discussed today and is in agreement with them.  She knows to contact our office if she develops concerns prior to her next appointment.   I provided *** minutes of face-to-face time during this encounter and > 50% was spent counseling as documented under my assessment and plan.    Samanvitha Germany A Jeneane Pieczynski, PA-C  [facility] [place] - A DEPT OF [facility]. [facility] [address] [place] [address] Dept: [phone] Dept Fax: [phone]   No orders of the defined types were placed in this encounter.     CHIEF COMPLAINT:  CC: History of stage I colon cancer  Current Treatment: Surveillance  HISTORY OF PRESENT ILLNESS:   Oncology History  Malignant neoplasm of transverse colon (HCC) (Resolved)  07/16/2015 Cancer Staging   Staging form: Colon and Rectum, AJCC 7th Edition - Clinical stage from 07/16/2015: Stage I (T2, N0, M0) - Signed by Nolia Baumgartner, MD on 01/30/2022 Staged by: Managing physician Diagnostic confirmation: Positive histology Specimen type: Excision Histopathologic type: Adenocarcinoma, NOS Stage prefix: Initial diagnosis Laterality: Right Tumor size (mm): 35 Histologic grade (G): G1 Lymph-vascular invasion (LVI): LVI not present (absent)/not identified Residual tumor (R): R0 - None Tumor deposits (TD): Absent Perineural invasion (PNI): Absent Microsatellite instability (MSI): Unstable high KRAS gene analysis: Not assessed Prognostic indicators: MMR abnormal, BRAF V600E mutation positive, 12 neg nodes Stage used in treatment planning: Yes National guidelines used in treatment planning: Yes Type of national guideline  used in treatment planning: NCCN   07/22/2015 Initial Diagnosis   Malignant neoplasm of transverse colon (HCC)       INTERVAL HISTORY:   Avantika is here today for repeat clinical assessment.  She had and increase in her CEA earlier this year.  Evaluation did not reveal any evidence of malignancy.  She denies fevers or chills. She denies pain. Her appetite is good. Her weight {Weight change:10426}.  REVIEW OF SYSTEMS:   Review of Systems - Oncology   VITALS:   There were no vitals taken for this visit.  Wt Readings from Last 3 Encounters:  06/06/23 165 lb (74.8 kg)  05/05/23 161 lb (73 kg)  05/04/23 161 lb 1.6 oz (73.1 kg)    There is no height or weight on file to calculate BMI.  Performance status (ECOG): {CHL ONC [id number]    PHYSICAL EXAM:   Physical Exam  LABS:      Latest Ref Rng & Units 05/04/2023    1:20 PM 01/24/2023    9:52 AM 05/21/2022   11:24 AM  CBC  WBC 4.0 - 10.5 K/uL 6.3  4.7  3.7   Hemoglobin 12.0 - 15.0 g/dL 65.7  84.6  96.2   Hematocrit 36.0 - 46.0 % 33.9  32.6  35.0   Platelets 150 - 400 K/uL 194  156  167       Latest Ref Rng & Units 05/04/2023    1:20 PM 01/24/2023    9:52 AM 05/21/2022   11:24 AM  CMP  Glucose 70 - 99 mg/dL 952  841  324   BUN 8 - 23 mg/dL 20  23  22    Creatinine 0.44 -  1.00 mg/dL 2.84  1.32  4.40   Sodium 135 - 145 mmol/L 140  142  145   Potassium 3.5 - 5.1 mmol/L 3.8  4.2  4.8   Chloride 98 - 111 mmol/L 105  108  106   CO2 22 - 32 mmol/L 25  25  22    Calcium 8.9 - 10.3 mg/dL 10.2  9.9  9.6   Total Protein 6.5 - 8.1 g/dL 6.6  6.7  6.7   Total Bilirubin 0.0 - 1.2 mg/dL 0.5  0.4  0.4   Alkaline Phos 38 - 126 U/L 59  66  77   AST 15 - 41 U/L 23  25  21    ALT 0 - 44 U/L 11  11  13       Lab Results  Component Value Date   CEA1 5.1 (H) 01/12/2022   CEA 4.80 05/04/2023   /  CEA  Date Value Ref Range Status  01/12/2022 5.1 (H) 0.0 - 4.7 ng/mL Final    Comment:    (NOTE)                             Nonsmokers           <3.9                             Smokers             <5.6 Roche [employer] Electrochemiluminescence Immunoassay (ECLIA) Values obtained with different assay methods or kits cannot be used interchangeably.  Results cannot be interpreted as absolute evidence of the presence or absence of malignant disease. Performed At: [facility] [address] [place], [place] [id number] Pearlean Botts MD [id number]    CEA [facility])  Date Value Ref Range Status  05/04/2023 4.80 0.00 - 5.00 ng/mL Final    Comment:    (NOTE) This test was performed using Beckman Coulter's paramagnetic chemiluminescent immunoassay. Values obtained from different assay methods cannot be used interchangeably. Please note that up to 8% of patients who smoke may see values 5.1-10.0 ng/ml and 1% of patients who smoke may see CEA levels >10.0 ng/ml. Performed at [facility], [address] [place], [place] [address]    No results found for: "PSA1" No results found for: "[phone]" No results found for: "CAN125"  No results found for: "TOTALPROTELP", "ALBUMINELP", "A1GS", "A2GS", "BETS", "BETA2SER", "GAMS", "MSPIKE", "SPEI" Lab Results  Component Value Date   TIBC 388 05/04/2023   TIBC 417 01/24/2023   FERRITIN 89 05/04/2023   FERRITIN 32 01/24/2023   FERRITIN 28 08/20/2019   IRONPCTSAT 27 05/04/2023   IRONPCTSAT 22 01/24/2023   No results found for: "LDH"  STUDIES:   No results found.    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   Ingrown right big toenail 05/23/2022   Irregular heart beats 06/28/2021   Mixed hyperlipidemia 01/01/2021   NSVT (  nonsustained ventricular tachycardia) (HCC) 08/07/2021   Pain of lower extremity 05/20/2020   Personal history of colon cancer, stage I 07/18/2015   Transient ischemic attack    Weakness 06/28/2021    Past Surgical History:  Procedure Laterality Date   CATARACT EXTRACTION     CHOLECYSTECTOMY     HEMICOLECTOMY  2017   SPHINCTEROTOMY  2011    Family History  Problem Relation Age of Onset   Dementia Mother    Lung cancer Father    Heart disease Father    Renal cancer Sister    Prostate cancer Brother    Breast cancer Paternal Grandmother     Social History:  reports that she has never smoked. She has never used smokeless tobacco. She reports that she does not drink alcohol and does not use drugs.The patient is {Blank single:19197::"alone","accompanied by"} *** today.  Allergies:  Allergies  Allergen Reactions   Nsaids Other (See Comments)    GI Bleed    Aspirin Other (See Comments)   Atenolol Other (See Comments)   Naproxen Other (See Comments)   Propranolol Other (See Comments)    Unknown   Tizanidine  Other (See Comments)    Dizziness     Current Medications: Current Outpatient Medications  Medication Sig Dispense Refill   Calcium Carbonate-Vitamin D  (CALCIUM-VITAMIN D  PO) Take 1 tablet by mouth 2 (two) times daily.     Cyanocobalamin  (B-12 IJ) Inject as directed every 30 (thirty) days.     ergocalciferol  (VITAMIN D2) 1.25 MG (50000 UT) capsule Take 1 capsule (50,000 Units total) by mouth once a week. 12 capsule 0   escitalopram  (LEXAPRO ) 5 MG tablet Take 1 tablet (5 mg total) by mouth daily. 90 tablet 0   famotidine  (PEPCID ) 40 MG tablet Take 40 mg by mouth daily.     famotidine  (PEPCID ) 40 MG tablet Take 1 tablet (40 mg total)  by mouth daily. 90 tablet 0   Glucosamine Sulfate 500 MG TABS Take 500 mg by mouth daily.     metoprolol  succinate (TOPROL  XL) 25 MG 24 hr tablet Take 0.5 tablets (12.5 mg total) by mouth daily. 45 tablet 2   Vitamin D , Ergocalciferol , (DRISDOL ) 1.25 MG (50000 UNIT) CAPS capsule Take 50,000 Units by mouth every 7 (seven) days.     Current Facility-Administered Medications  Medication Dose Route Frequency Provider Last Rate Last Admin   triamcinolone  acetonide (KENALOG -40) injection 80 mg  80 mg Intra-articular Once Mercy Stall, MD

## 2023-08-10 ENCOUNTER — Inpatient Hospital Stay

## 2023-08-10 ENCOUNTER — Inpatient Hospital Stay (HOSPITAL_BASED_OUTPATIENT_CLINIC_OR_DEPARTMENT_OTHER): Admitting: Hematology and Oncology

## 2023-08-10 ENCOUNTER — Encounter: Payer: Self-pay | Admitting: Hematology and Oncology

## 2023-08-10 ENCOUNTER — Ambulatory Visit

## 2023-08-10 ENCOUNTER — Ambulatory Visit: Admitting: Hematology and Oncology

## 2023-08-10 ENCOUNTER — Inpatient Hospital Stay: Attending: Oncology

## 2023-08-10 ENCOUNTER — Other Ambulatory Visit (HOSPITAL_BASED_OUTPATIENT_CLINIC_OR_DEPARTMENT_OTHER): Payer: Self-pay

## 2023-08-10 ENCOUNTER — Inpatient Hospital Stay: Admitting: Hematology and Oncology

## 2023-08-10 VITALS — BP 145/62 | HR 68 | Resp 18 | Ht 67.0 in

## 2023-08-10 VITALS — BP 115/70 | HR 72 | Temp 98.2°F | Resp 18 | Ht 67.0 in | Wt 164.0 lb

## 2023-08-10 DIAGNOSIS — E538 Deficiency of other specified B group vitamins: Secondary | ICD-10-CM | POA: Insufficient documentation

## 2023-08-10 DIAGNOSIS — E860 Dehydration: Secondary | ICD-10-CM

## 2023-08-10 DIAGNOSIS — C184 Malignant neoplasm of transverse colon: Secondary | ICD-10-CM | POA: Diagnosis not present

## 2023-08-10 DIAGNOSIS — N1832 Chronic kidney disease, stage 3b: Secondary | ICD-10-CM

## 2023-08-10 DIAGNOSIS — Z85038 Personal history of other malignant neoplasm of large intestine: Secondary | ICD-10-CM

## 2023-08-10 DIAGNOSIS — R970 Elevated carcinoembryonic antigen [CEA]: Secondary | ICD-10-CM

## 2023-08-10 LAB — CBC WITH DIFFERENTIAL (CANCER CENTER ONLY)
Abs Immature Granulocytes: 0.01 10*3/uL (ref 0.00–0.07)
Basophils Absolute: 0 10*3/uL (ref 0.0–0.1)
Basophils Relative: 1 %
Eosinophils Absolute: 0.5 10*3/uL (ref 0.0–0.5)
Eosinophils Relative: 9 %
HCT: 33.5 % — ABNORMAL LOW (ref 36.0–46.0)
Hemoglobin: 11.1 g/dL — ABNORMAL LOW (ref 12.0–15.0)
Immature Granulocytes: 0 %
Lymphocytes Relative: 32 %
Lymphs Abs: 1.9 10*3/uL (ref 0.7–4.0)
MCH: 30.1 pg (ref 26.0–34.0)
MCHC: 33.1 g/dL (ref 30.0–36.0)
MCV: 90.8 fL (ref 80.0–100.0)
Monocytes Absolute: 0.6 10*3/uL (ref 0.1–1.0)
Monocytes Relative: 9 %
Neutro Abs: 3 10*3/uL (ref 1.7–7.7)
Neutrophils Relative %: 49 %
Platelet Count: 188 10*3/uL (ref 150–400)
RBC: 3.69 MIL/uL — ABNORMAL LOW (ref 3.87–5.11)
RDW: 13.2 % (ref 11.5–15.5)
WBC Count: 6.1 10*3/uL (ref 4.0–10.5)
nRBC: 0 % (ref 0.0–0.2)

## 2023-08-10 LAB — CMP (CANCER CENTER ONLY)
ALT: 7 U/L (ref 0–44)
AST: 21 U/L (ref 15–41)
Albumin: 4.4 g/dL (ref 3.5–5.0)
Alkaline Phosphatase: 59 U/L (ref 38–126)
Anion gap: 13 (ref 5–15)
BUN: 27 mg/dL — ABNORMAL HIGH (ref 8–23)
CO2: 23 mmol/L (ref 22–32)
Calcium: 10.4 mg/dL — ABNORMAL HIGH (ref 8.9–10.3)
Chloride: 104 mmol/L (ref 98–111)
Creatinine: 1.87 mg/dL — ABNORMAL HIGH (ref 0.44–1.00)
GFR, Estimated: 26 mL/min — ABNORMAL LOW (ref >60)
Glucose, Bld: 113 mg/dL — ABNORMAL HIGH (ref 70–99)
Potassium: 3.9 mmol/L (ref 3.5–5.1)
Sodium: 139 mmol/L (ref 135–145)
Total Bilirubin: 0.4 mg/dL (ref 0.0–1.2)
Total Protein: 6.8 g/dL (ref 6.5–8.1)

## 2023-08-10 MED ORDER — CYANOCOBALAMIN 1000 MCG/ML IJ SOLN
1000.0000 ug | Freq: Once | INTRAMUSCULAR | Status: AC
  Administered 2023-08-10: 1000 ug via INTRAMUSCULAR
  Filled 2023-08-10: qty 1

## 2023-08-10 MED ORDER — SODIUM CHLORIDE 0.9 % IV SOLN: Freq: Once | INTRAVENOUS | Status: AC

## 2023-08-10 NOTE — Patient Instructions (Signed)
 Dehydration, Adult Dehydration is a condition in which there is not enough water or other fluids in the body. This happens when a person loses more fluids than they take in. Important organs cannot work right without the right amount of fluids. Any loss of fluids from the body can cause dehydration. Dehydration can be mild, worse, or very bad. It should be treated right away to keep it from getting very bad. What are the causes? Conditions that cause loss of water in the body. They include: Watery poop (diarrhea). Vomiting. Sweating a lot. Fever. Infection. Peeing (urinating) a lot. Not drinking enough fluids. Certain medicines, such as medicines that take extra fluid out of the body (diuretics). Lack of safe drinking water. Not being able to get enough water and food. What increases the risk? Having a long-term (chronic) illness that has not been treated the right way, such as: Diabetes. Heart disease. Kidney disease. Being 25 years of age or older. Having a disability. Living in a place that is high above the ground or sea (high in altitude). The thinner, drier air causes more fluid loss. Doing exercises that put stress on your body for a long time. Being active when in hot places. What are the signs or symptoms? Symptoms of dehydration depend on how bad it is. Mild or worse dehydration Thirst. Dry lips or dry mouth. Feeling dizzy or light-headed. Muscle cramps. Passing little pee or dark pee. Pee may be the color of tea. Headache. Very bad dehydration Changes in skin. Skin may: Be cold to the touch (clammy). Be blotchy or pale. Not go back to normal right after you pinch it and let it go. Little or no tears, pee, or sweat. Fast breathing. Low blood pressure. Weak pulse. Pulse that is more than 100 beats a minute when you are sitting still. Other changes, such as: Feeling very thirsty. Eyes that look hollow (sunken). Cold hands and feet. Being confused. Being very  tired (lethargic) or having trouble waking from sleep. Losing weight. Loss of consciousness. How is this treated? Treatment for this condition depends on how bad your dehydration is. Treatment should start right away. Do not wait until your condition gets very bad. Very bad dehydration is an emergency. You will need to go to a hospital. Mild or worse dehydration can be treated at home. You may be asked to: Drink more fluids. Drink an oral rehydration solution (ORS). This drink gives you the right amount of fluids, salts, and minerals (electrolytes). Very bad dehydration can be treated: With fluids through an IV tube. By correcting low levels of electrolytes in the body. By treating the problem that caused your dehydration. Follow these instructions at home: Oral rehydration solution If told by your doctor, drink an ORS: Make an ORS. Use instructions on the package. Start by drinking small amounts, about  cup (120 mL) every 5-10 minutes. Slowly drink more until you have had the amount that your doctor said to have.  Eating and drinking  Drink enough clear fluid to keep your pee pale yellow. If you were told to drink an ORS, finish the ORS first. Then, start slowly drinking other clear fluids. Drink fluids such as: Water. Do not drink only water. Doing that can make the salt (sodium) level in your body get too low. Water from ice chips you suck on. Fruit juice that you have added water to (diluted). Low-calorie sports drinks. Eat foods that have the right amounts of salts and minerals, such as bananas, oranges, potatoes,  tomatoes, or spinach. Do not drink alcohol. Avoid drinks that have caffeine or sugar. These include:: High-calorie sports drinks. Fruit juice that you did not add water to. Soda. Coffee or energy drinks. Avoid foods that are greasy or have a lot of fat or sugar. General instructions Take over-the-counter and prescription medicines only as told by your doctor. Do  not take sodium tablets. Doing that can make the salt level in your body get too high. Return to your normal activities as told by your doctor. Ask your doctor what activities are safe for you. Keep all follow-up visits. Your doctor may check and change your treatment. Contact a doctor if: You have pain in your belly (abdomen) and the pain: Gets worse. Stays in one place. You have a rash. You have a stiff neck. You get angry or annoyed more easily than normal. You are more tired or have a harder time waking than normal. You feel weak or dizzy. You feel very thirsty. Get help right away if: You have any symptoms of very bad dehydration. You vomit every time you eat or drink. Your vomiting gets worse, does not go away, or you vomit blood or green stuff. You are getting treatment, but symptoms are getting worse. You have a fever. You have a very bad headache. You have: Diarrhea that gets worse or does not go away. Blood in your poop (stool). This may cause poop to look black and tarry. No pee in 6-8 hours. Only a small amount of pee in 6-8 hours, and the pee is very dark. You have trouble breathing. These symptoms may be an emergency. Get help right away. Call 911. Do not wait to see if the symptoms will go away. Do not drive yourself to the hospital. This information is not intended to replace advice given to you by your health care provider. Make sure you discuss any questions you have with your health care provider. Document Revised: 09/14/2021 Document Reviewed: 09/14/2021 Elsevier Patient Education  2024 [employer].

## 2023-08-11 ENCOUNTER — Encounter: Payer: Self-pay | Admitting: Hematology and Oncology

## 2023-08-11 DIAGNOSIS — E860 Dehydration: Secondary | ICD-10-CM

## 2023-08-11 HISTORY — DX: Dehydration: E86.0

## 2023-08-11 NOTE — Progress Notes (Signed)
 [facility]  [address] 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  She was treated with surgical resection with findings of a 3.5 cm adenocarcinoma with 12 negative nodes.  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  Due to her stage of disease, she did not require adjuvant chemotherapy.  She has had intermittent mild elevation of the CEA of uncertain etiology.  Repeat imaging over the years has never revealed any evidence of recurrence.  Colonoscopy in January 2021 did not reveal any evidence of malignancy.  CT chest, abdomen and pelvis in January 2023 did not reveal any evidence of malignancy.  We did not recommend routine imaging.  Her CEA went up to 6.18 in November 2024, which was the highest it has ever been.  She is a non-smoker.  Repeat CT chest, abdomen and pelvis in December 2024 did not reveal any evidence of malignancy.  CEA was normal in March.  EGD and colonoscopy in April did not reveal any evidence of malignancy or source of bleeding.  Sigmoid diverticulosis was seen.  CEA is pending from today.  I will have her see Dr. Almer Orr in 3 months with a CBC, comprehensive metabolic panel and CEA for closer follow-up.  Dehydration She is clinically dehydrated.  I will give her IV fluids today.  She knows to continue to push clear fluids at home.  I will plan to repeat a BMP early next week to ensure her kidney function improves.  Chronic renal impairment, stage 3b (HCC) Worsening chronic kidney disease likely due to dehydration.  She will receive IV fluids today.  I will plan to repeat a BMP early next week to be sure her kidney function improves to baseline.  She has never seen a nephrologist, so I will  refer her at this time.  B12 deficiency B12 deficiency diagnosed in October 2018.  She remains on B12 injections monthly.  Her hemoglobin is fairly stable.  Her chronic kidney disease may be contributing to her anemia.    The patient understands the plans discussed today and is in agreement with them.  She knows to contact our office if she develops concerns prior to her next appointment.   I provided 30 minutes of face-to-face time during this encounter and > 50% was spent counseling as documented under my assessment and plan.    Rebecca Orr A Rebecca Hunsucker, PA-C  [facility] Rebecca Orr - A DEPT OF [facility] Rebecca Orr. [facility] [address] [place] [address] Dept: [phone] Dept Fax: [phone]   Orders Placed This Encounter  Procedures   Basic metabolic panel with GFR    Standing Status:   Future    Expected Date:   08/16/2023    Expiration Date:   08/09/2024      CHIEF COMPLAINT:  CC: History of stage I colon cancer  Current Treatment: Surveillance  HISTORY OF PRESENT ILLNESS:   Oncology History  Malignant neoplasm of transverse colon (HCC) (Resolved)  07/16/2015 Cancer Staging   Staging form: Colon and Rectum, AJCC 7th Edition - Clinical stage from 07/16/2015: Stage I (T2, N0, M0) - Signed by Rebecca Baumgartner, MD on 01/30/2022 Staged by: Managing physician Diagnostic confirmation: Positive  histology Specimen type: Excision Histopathologic type: Adenocarcinoma, NOS Stage prefix: Initial diagnosis Laterality: Right Tumor size (mm): 35 Histologic grade (G): G1 Lymph-vascular invasion (LVI): LVI not present (absent)/not identified Residual tumor (R): R0 - None Tumor deposits (TD): Absent Perineural invasion (PNI): Absent Microsatellite instability (MSI): Unstable high KRAS gene analysis: Not assessed Prognostic indicators: MMR abnormal, BRAF V600E mutation positive, 12 neg nodes Stage used in treatment planning: Yes National guidelines  used in treatment planning: Yes Type of national guideline used in treatment planning: NCCN   07/22/2015 Initial Diagnosis   Malignant neoplasm of transverse colon (HCC)       INTERVAL HISTORY:   Rebecca Orr is here today for repeat clinical assessment.  She reports episodes of dizziness.  She states she drinks about 3 bottles of water a day.  She has not had much to drink today.   She denies fevers or chills. She denies pain. Her appetite is good. Her weight has been stable.  She had and increase in her CEA earlier this year.  Evaluation did not reveal any evidence of malignancy.  This CEA returned to normal in March.  REVIEW OF SYSTEMS:   Review of Systems  Constitutional:  Negative for appetite change, chills, fatigue, fever and unexpected weight change.  HENT:   Negative for lump/mass, mouth sores and sore throat.   Respiratory:  Positive for shortness of breath (with exertion). Negative for cough.   Cardiovascular:  Negative for chest pain and leg swelling.  Gastrointestinal:  Negative for abdominal pain, constipation, diarrhea, nausea and vomiting.  Endocrine: Negative for hot flashes.  Genitourinary:  Negative for difficulty urinating, dysuria, frequency and hematuria.   Musculoskeletal:  Negative for arthralgias, back pain, gait problem and myalgias.  Skin:  Negative for rash.  Neurological:  Positive for dizziness (intermittent). Negative for extremity weakness, gait problem, headaches and numbness.  Hematological:  Negative for adenopathy. Does not bruise/bleed easily.  Psychiatric/Behavioral:  Negative for depression and sleep disturbance. The patient is not nervous/anxious.      VITALS:   Blood pressure 115/70, pulse 72, temperature 98.2 F (36.8 C), temperature source Oral, resp. rate 18, height 5' 7 (1.702 m), weight 164 lb (74.4 kg), SpO2 100%.  Wt Readings from Last 3 Encounters:  08/10/23 164 lb (74.4 kg)  06/06/23 165 lb (74.8 kg)  05/05/23 161 lb (73 kg)    Body  mass index is 25.69 kg/m.  Performance status (ECOG): 1 - Symptomatic but completely ambulatory    PHYSICAL EXAM:   Physical Exam Vitals and nursing note reviewed.  Constitutional:      General: She is not in acute distress.    Appearance: Normal appearance. She is overweight. She is not ill-appearing.  HENT:     Head: Normocephalic and atraumatic.     Mouth/Throat:     Mouth: Mucous membranes are dry.     Pharynx: Oropharynx is clear. No oropharyngeal exudate or posterior oropharyngeal erythema.   Eyes:     General: No scleral icterus.    Extraocular Movements: Extraocular movements intact.     Conjunctiva/sclera: Conjunctivae normal.     Pupils: Pupils are equal, round, and reactive to light.    Cardiovascular:     Rate and Rhythm: Normal rate and regular rhythm.     Heart sounds: Normal heart sounds. No murmur heard.    No friction rub. No gallop.  Pulmonary:     Effort: Pulmonary effort is normal.     Breath sounds: Normal breath sounds. No wheezing, rhonchi  or rales.  Abdominal:     General: There is no distension.     Palpations: Abdomen is soft. There is no mass.     Tenderness: There is no abdominal tenderness.   Musculoskeletal:        General: Normal range of motion.     Cervical back: Normal range of motion and neck supple. No tenderness.     Right lower leg: No edema.     Left lower leg: No edema.  Lymphadenopathy:     Cervical: No cervical adenopathy.   Skin:    General: Skin is warm and dry.     Coloration: Skin is not jaundiced.     Findings: No rash.     Comments: Skin turgor is decreased   Neurological:     Mental Status: She is alert and oriented to person, place, and time.     Cranial Nerves: No cranial nerve deficit.   Psychiatric:        Mood and Affect: Mood normal.        Behavior: Behavior normal.        Thought Content: Thought content normal.     LABS:      Latest Ref Rng & Units 08/10/2023    1:59 PM 05/04/2023    1:20 PM  01/24/2023    9:52 AM  CBC  WBC 4.0 - 10.5 K/uL 6.1  6.3  4.7   Hemoglobin 12.0 - 15.0 g/dL 16.1  09.6  04.5   Hematocrit 36.0 - 46.0 % 33.5  33.9  32.6   Platelets 150 - 400 K/uL 188  194  156       Latest Ref Rng & Units 08/10/2023    1:59 PM 05/04/2023    1:20 PM 01/24/2023    9:52 AM  CMP  Glucose 70 - 99 mg/dL 409  811  914   BUN 8 - 23 mg/dL 27  20  23    Creatinine 0.44 - 1.00 mg/dL 7.82  9.56  2.13   Sodium 135 - 145 mmol/L 139  140  142   Potassium 3.5 - 5.1 mmol/L 3.9  3.8  4.2   Chloride 98 - 111 mmol/L 104  105  108   CO2 22 - 32 mmol/L 23  25  25    Calcium 8.9 - 10.3 mg/dL 08.6  57.8  9.9   Total Protein 6.5 - 8.1 g/dL 6.8  6.6  6.7   Total Bilirubin 0.0 - 1.2 mg/dL 0.4  0.5  0.4   Alkaline Phos 38 - 126 U/L 59  59  66   AST 15 - 41 U/L 21  23  25    ALT 0 - 44 U/L 7  11  11       Lab Results  Component Value Date   CEA1 5.1 (H) 01/12/2022   CEA 4.80 05/04/2023   /  CEA  Date Value Ref Range Status  01/12/2022 5.1 (H) 0.0 - 4.7 ng/mL Final    Comment:    (NOTE)                             Nonsmokers          <3.9                             Smokers             <5.6 Roche  Diagnostics Electrochemiluminescence Immunoassay (ECLIA) Values obtained with different assay methods or kits cannot be used interchangeably.  Results cannot be interpreted as absolute evidence of the presence or absence of malignant disease. Performed At: [facility] [address] [place], [place] [id number] Pearlean Botts MD [id number]    CEA [facility])  Date Value Ref Range Status  05/04/2023 4.80 0.00 - 5.00 ng/mL Final    Comment:    (NOTE) This test was performed using Beckman Coulter's paramagnetic chemiluminescent immunoassay. Values obtained from different assay methods cannot be used interchangeably. Please note that up to 8% of patients who smoke may see values 5.1-10.0 ng/ml and 1% of patients who smoke may see CEA levels >10.0 ng/ml. Performed at [facility], [address], [place], [place] [address]     Lab Results  Component Value Date   TIBC 388 05/04/2023   TIBC 417 01/24/2023   FERRITIN 89 05/04/2023   FERRITIN 32 01/24/2023   FERRITIN 28 08/20/2019   IRONPCTSAT 27 05/04/2023   IRONPCTSAT 22 01/24/2023     STUDIES:   No results found.    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   Ingrown right big toenail 05/23/2022   Irregular heart beats 06/28/2021   Mixed hyperlipidemia 01/01/2021   NSVT (nonsustained ventricular tachycardia) (HCC) 08/07/2021   Pain of lower extremity 05/20/2020   Personal history of colon cancer, stage I 07/18/2015   Transient ischemic attack    Weakness 06/28/2021    Past Surgical History:  Procedure Laterality Date   CATARACT EXTRACTION     CHOLECYSTECTOMY     HEMICOLECTOMY  2017   SPHINCTEROTOMY  2011    Family History  Problem Relation Age of Onset   Dementia Mother    Lung cancer Father    Heart disease Father    Renal cancer Sister    Prostate cancer Brother    Breast cancer Paternal Grandmother     Social History:  reports that she has never smoked. She has never used smokeless tobacco. She reports that she does not drink alcohol and does not use drugs.The patient is alone today.  Allergies:  Allergies  Allergen Reactions   Nsaids Other (See Comments)    GI Bleed    Aspirin Other (See Comments)    Atenolol Other (See Comments)   Naproxen Other (See Comments)   Propranolol Other (See Comments)    Unknown   Tizanidine  Other (See Comments)    Dizziness     Current Medications: Current Outpatient Medications  Medication Sig Dispense Refill   Calcium Carbonate-Vitamin D  (CALCIUM-VITAMIN D  PO) Take 1 tablet by mouth 2 (two) times daily.     ciclopirox  (PENLAC ) 8 % solution Apply topically at bedtime. Apply over nail and surrounding skin. Apply daily over previous coat. After seven (7) days, may remove with alcohol and continue cycle. 6.6 mL 3   Cyanocobalamin  (B-12 IJ) Inject as directed every 30 (thirty) days.     ergocalciferol  (VITAMIN D2) 1.25 MG (50000 UT)  capsule Take 1 capsule (50,000 Units total) by mouth once a week. 12 capsule 0   escitalopram  (LEXAPRO ) 5 MG tablet Take 1 tablet (5 mg total) by mouth daily. 90 tablet 0   famotidine  (PEPCID ) 40 MG tablet Take 1 tablet (40 mg total) by mouth daily. 90 tablet 0   Glucosamine Sulfate 500 MG TABS Take 500 mg by mouth daily.     metoprolol  succinate (TOPROL  XL) 25 MG 24 hr tablet Take 0.5 tablets (12.5 mg total) by mouth daily. 45 tablet 2   Current Facility-Administered Medications  Medication Dose Route Frequency Provider Last Rate Last Admin   triamcinolone  acetonide (KENALOG -40) injection 80 mg  80 mg Intra-articular Once Mercy Stall, MD

## 2023-08-11 NOTE — Assessment & Plan Note (Addendum)
 She is clinically dehydrated.  I will give her IV fluids today.  She knows to continue to push clear fluids at home.  I will plan to repeat a BMP early next week to ensure her kidney function improves.

## 2023-08-11 NOTE — Assessment & Plan Note (Signed)
 B12 deficiency diagnosed in October 2018.  She remains on B12 injections monthly.  Her hemoglobin is fairly stable.  Her chronic kidney disease may be contributing to her anemia.

## 2023-08-11 NOTE — Assessment & Plan Note (Addendum)
 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  She was treated with surgical resection with findings of a 3.5 cm adenocarcinoma with 12 negative nodes.  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  Due to her stage of disease, she did not require adjuvant chemotherapy.  She has had intermittent mild elevation of the CEA of uncertain etiology.  Repeat imaging over the years has never revealed any evidence of recurrence.  Colonoscopy in January 2021 did not reveal any evidence of malignancy.  CT chest, abdomen and pelvis in January 2023 did not reveal any evidence of malignancy.  We did not recommend routine imaging.  Her CEA went up to 6.18 in November 2024, which was the highest it has ever been.  She is a non-smoker.  Repeat CT chest, abdomen and pelvis in December 2024 did not reveal any evidence of malignancy.  CEA was normal in March.  EGD and colonoscopy in April did not reveal any evidence of malignancy or source of bleeding.  Sigmoid diverticulosis was seen.  CEA is pending from today.  I will have her see Dr. Almer Jacobson in 3 months with a CBC, comprehensive metabolic panel and CEA for closer follow-up.

## 2023-08-11 NOTE — Assessment & Plan Note (Addendum)
 Worsening chronic kidney disease likely due to dehydration.  She will receive IV fluids today.  I will plan to repeat a BMP early next week to be sure her kidney function improves to baseline.  She has never seen a nephrologist, so I will refer her at this time.

## 2023-08-12 ENCOUNTER — Telehealth: Payer: Self-pay

## 2023-08-12 NOTE — Telephone Encounter (Signed)
 Patient notified and voiced understanding.

## 2023-08-12 NOTE — Telephone Encounter (Signed)
-----   Message from Rebecca Orr sent at 08/12/2023  9:09 AM EDT ----- Please let her know her CEA remains normal.  Thank you

## 2023-08-12 NOTE — Telephone Encounter (Signed)
-----   Message from Alfonso Ike sent at 08/11/2023  6:55 PM EDT ----- Please refer to nephrology for worsening chronic kidney disease. Thanks

## 2023-08-12 NOTE — Telephone Encounter (Signed)
Referral faxed to [place] [facility]

## 2023-08-12 NOTE — Addendum Note (Signed)
 Addended by: Tucker Gails on: 08/12/2023 03:47 PM   Modules accepted: Orders

## 2023-08-15 ENCOUNTER — Other Ambulatory Visit (HOSPITAL_BASED_OUTPATIENT_CLINIC_OR_DEPARTMENT_OTHER): Payer: Self-pay

## 2023-08-15 MED ORDER — FAMOTIDINE 40 MG PO TABS
40.0000 mg | ORAL_TABLET | Freq: Every day | ORAL | 0 refills | Status: DC
  Filled 2023-08-15: qty 90, 90d supply, fill #0

## 2023-08-16 ENCOUNTER — Inpatient Hospital Stay

## 2023-08-16 DIAGNOSIS — E860 Dehydration: Secondary | ICD-10-CM

## 2023-08-16 DIAGNOSIS — E538 Deficiency of other specified B group vitamins: Secondary | ICD-10-CM | POA: Diagnosis not present

## 2023-08-16 LAB — CEA (ACCESS): CEA (CHCC): 5 ng/mL (ref 0.00–5.00)

## 2023-08-16 LAB — BASIC METABOLIC PANEL WITH GFR
Anion gap: 9 (ref 5–15)
BUN: 19 mg/dL (ref 8–23)
CO2: 23 mmol/L (ref 22–32)
Calcium: 10.3 mg/dL (ref 8.9–10.3)
Chloride: 106 mmol/L (ref 98–111)
Creatinine, Ser: 1.6 mg/dL — ABNORMAL HIGH (ref 0.44–1.00)
GFR, Estimated: 32 mL/min — ABNORMAL LOW (ref >60)
Glucose, Bld: 108 mg/dL — ABNORMAL HIGH (ref 70–99)
Potassium: 4.3 mmol/L (ref 3.5–5.1)
Sodium: 138 mmol/L (ref 135–145)

## 2023-08-17 ENCOUNTER — Telehealth: Payer: Self-pay

## 2023-08-17 NOTE — Telephone Encounter (Signed)
 Patient notified and voiced understanding.

## 2023-08-17 NOTE — Telephone Encounter (Signed)
-----   Message from Alfonso Ike sent at 08/16/2023  4:37 PM EDT ----- Please let her know her kidney function was improved, though still not normal, and CEA normal. Thanks

## 2023-08-31 ENCOUNTER — Other Ambulatory Visit (HOSPITAL_BASED_OUTPATIENT_CLINIC_OR_DEPARTMENT_OTHER): Payer: Self-pay

## 2023-08-31 MED ORDER — ESCITALOPRAM OXALATE 5 MG PO TABS
5.0000 mg | ORAL_TABLET | Freq: Every day | ORAL | 0 refills | Status: DC
  Filled 2023-08-31: qty 90, 90d supply, fill #0

## 2023-09-05 ENCOUNTER — Inpatient Hospital Stay: Payer: Medicare HMO | Attending: Oncology

## 2023-09-05 VITALS — BP 158/69 | HR 70 | Temp 98.0°F | Resp 18 | Wt 164.0 lb

## 2023-09-05 DIAGNOSIS — E538 Deficiency of other specified B group vitamins: Secondary | ICD-10-CM | POA: Insufficient documentation

## 2023-09-05 MED ORDER — CYANOCOBALAMIN 1000 MCG/ML IJ SOLN
1000.0000 ug | Freq: Once | INTRAMUSCULAR | Status: AC
  Administered 2023-09-05: 1000 ug via INTRAMUSCULAR
  Filled 2023-09-05: qty 1

## 2023-09-12 ENCOUNTER — Other Ambulatory Visit (HOSPITAL_BASED_OUTPATIENT_CLINIC_OR_DEPARTMENT_OTHER): Payer: Self-pay

## 2023-09-12 MED ORDER — ERGOCALCIFEROL 1.25 MG (50000 UT) PO CAPS
50000.0000 [IU] | ORAL_CAPSULE | ORAL | 0 refills | Status: DC
  Filled 2023-09-12: qty 12, 84d supply, fill #0

## 2023-09-20 ENCOUNTER — Ambulatory Visit: Admitting: Podiatry

## 2023-09-20 DIAGNOSIS — R2681 Unsteadiness on feet: Secondary | ICD-10-CM | POA: Diagnosis not present

## 2023-09-20 DIAGNOSIS — K219 Gastro-esophageal reflux disease without esophagitis: Secondary | ICD-10-CM | POA: Diagnosis not present

## 2023-09-20 DIAGNOSIS — M1711 Unilateral primary osteoarthritis, right knee: Secondary | ICD-10-CM | POA: Diagnosis not present

## 2023-09-20 DIAGNOSIS — E559 Vitamin D deficiency, unspecified: Secondary | ICD-10-CM | POA: Diagnosis not present

## 2023-09-20 DIAGNOSIS — Z85038 Personal history of other malignant neoplasm of large intestine: Secondary | ICD-10-CM | POA: Diagnosis not present

## 2023-09-20 DIAGNOSIS — F419 Anxiety disorder, unspecified: Secondary | ICD-10-CM | POA: Diagnosis not present

## 2023-09-20 DIAGNOSIS — M159 Polyosteoarthritis, unspecified: Secondary | ICD-10-CM | POA: Diagnosis not present

## 2023-09-20 DIAGNOSIS — K649 Unspecified hemorrhoids: Secondary | ICD-10-CM | POA: Diagnosis not present

## 2023-09-20 DIAGNOSIS — I10 Essential (primary) hypertension: Secondary | ICD-10-CM | POA: Diagnosis not present

## 2023-09-20 DIAGNOSIS — D649 Anemia, unspecified: Secondary | ICD-10-CM | POA: Diagnosis not present

## 2023-09-27 ENCOUNTER — Ambulatory Visit: Admitting: Podiatry

## 2023-09-27 ENCOUNTER — Encounter: Payer: Medicare HMO | Admitting: Family Medicine

## 2023-09-27 DIAGNOSIS — M79674 Pain in right toe(s): Secondary | ICD-10-CM

## 2023-09-27 DIAGNOSIS — M79675 Pain in left toe(s): Secondary | ICD-10-CM

## 2023-09-27 DIAGNOSIS — B351 Tinea unguium: Secondary | ICD-10-CM | POA: Diagnosis not present

## 2023-09-27 NOTE — Patient Instructions (Signed)
 Look for urea 40% cream, ointment or nail gel to the thickened nails 1-2 times a day. This can be bought over the counter, at a pharmacy or online such as [employer].

## 2023-09-27 NOTE — Progress Notes (Unsigned)
  Subjective:  Patient ID: Rebecca Orr, female    DOB: Sep 07, 1940,  MRN: [id number]  No chief complaint on file.   83 y.o. female presents with the above complaint. History confirmed with patient. Patient presenting with pain related to dystrophic thickened elongated nails. Patient is unable to trim own nails related to nail dystrophy and/or mobility issues. Patient does not have a history of T2DM.  Patient previously had ingrown nail removed on June 22, 2022, She does report some signs of regrowth but no significant pain at this point.  She has been using ciclopirox  topical antifungal medication since May 8.  Does feel that this is helping the appearance of the nails overall  Objective:  Physical Exam: warm, good capillary refill nail exam onychomycosis of the toenails, onycholysis, and dystrophic nails DP pulses palpable, PT pulses palpable, and protective sensation intact Left Foot:  Pain with palpation of nails due to elongation and dystrophic growth.  Minimal residual mycotic nail changes distally Right Foot: Pain with palpation of nails due to elongation and dystrophic growth.  Some residual mycotic nail changes distally some recurrence of right hallux medial border nail growth without pain today.  Assessment:   1. Pain due to onychomycosis of toenails of both feet      Plan:  Patient was evaluated and treated and all questions answered.   #Onychomycosis with pain  -Nails palliatively debrided as below. -Educated on self-care - Has had good progression with the Penlac .  Can get discontinue the use. - There is some residual yellow discoloration and some nail thickening we will see if this improves over time  Procedure: Nail Debridement Rationale: Pain Type of Debridement: manual, sharp debridement. Instrumentation: Nail nipper, rotary burr. Number of Nails: 10  Return in about 3 months (around 12/28/2023) for Routine Foot Care.         Ethan Saddler,  DPM [facility]

## 2023-09-29 ENCOUNTER — Encounter: Payer: Self-pay | Admitting: Podiatry

## 2023-10-03 ENCOUNTER — Ambulatory Visit: Payer: Medicare HMO

## 2023-10-06 ENCOUNTER — Inpatient Hospital Stay: Payer: Self-pay | Attending: Oncology

## 2023-10-06 VITALS — BP 135/57 | HR 61

## 2023-10-06 DIAGNOSIS — E538 Deficiency of other specified B group vitamins: Secondary | ICD-10-CM | POA: Insufficient documentation

## 2023-10-06 MED ORDER — CYANOCOBALAMIN 1000 MCG/ML IJ SOLN
1000.0000 ug | Freq: Once | INTRAMUSCULAR | Status: AC
  Administered 2023-10-06: 1000 ug via INTRAMUSCULAR
  Filled 2023-10-06: qty 1

## 2023-10-06 NOTE — Patient Instructions (Signed)
 Vitamin B12 Injection What is this medication? Vitamin B12 (VAHY tuh min B12) prevents and treats low vitamin B12 levels in your body. It is used in people who do not get enough vitamin B12 from their diet or when their digestive tract does not absorb enough. Vitamin B12 plays an important role in maintaining the health of your nervous system and red blood cells. This medicine may be used for other purposes; ask your health care provider or pharmacist if you have questions. COMMON BRAND NAME(S): B-12 Compliance Kit, B-12 Injection Kit, Cyomin, Dodex, LA-12, Nutri-Twelve, Physicians EZ Use B-12, Primabalt, Vitamin Deficiency Injectable System - B12 What should I tell my care team before I take this medication? They need to know if you have any of these conditions: Kidney disease Leber's disease Megaloblastic anemia An unusual or allergic reaction to cyanocobalamin, cobalt, other medications, foods, dyes, or preservatives Pregnant or trying to get pregnant Breast-feeding How should I use this medication? This medication is injected into a muscle or deeply under the skin. It is usually given in a clinic or care team's office. However, your care team may teach you how to inject yourself. Follow all instructions. Talk to your care team about the use of this medication in children. Special care may be needed. Overdosage: If you think you have taken too much of this medicine contact a poison control center or emergency room at once. NOTE: This medicine is only for you. Do not share this medicine with others. What if I miss a dose? If you are given your dose at a clinic or care team's office, call to reschedule your appointment. If you give your own injections, and you miss a dose, take it as soon as you can. If it is almost time for your next dose, take only that dose. Do not take double or extra doses. What may interact with this medication? Alcohol Colchicine This list may not describe all possible  interactions. Give your health care provider a list of all the medicines, herbs, non-prescription drugs, or dietary supplements you use. Also tell them if you smoke, drink alcohol, or use illegal drugs. Some items may interact with your medicine. What should I watch for while using this medication? Visit your care team regularly. You may need blood work done while you are taking this medication. You may need to follow a special diet. Talk to your care team. Limit your alcohol intake and avoid smoking to get the best benefit. What side effects may I notice from receiving this medication? Side effects that you should report to your care team as soon as possible: Allergic reactions--skin rash, itching, hives, swelling of the face, lips, tongue, or throat Swelling of the ankles, hands, or feet Trouble breathing Side effects that usually do not require medical attention (report to your care team if they continue or are bothersome): Diarrhea This list may not describe all possible side effects. Call your doctor for medical advice about side effects. You may report side effects to FDA at 1-800-FDA-1088. Where should I keep my medication? Keep out of the reach of children. Store at room temperature between 15 and 30 degrees C (59 and 85 degrees F). Protect from light. Throw away any unused medication after the expiration date. NOTE: This sheet is a summary. It may not cover all possible information. If you have questions about this medicine, talk to your doctor, pharmacist, or health care provider.  2024 Elsevier/Gold Standard (2020-10-28 00:00:00)

## 2023-11-01 ENCOUNTER — Ambulatory Visit: Payer: Medicare HMO

## 2023-11-07 ENCOUNTER — Inpatient Hospital Stay: Payer: Self-pay | Attending: Oncology

## 2023-11-07 VITALS — BP 139/62 | HR 67 | Temp 98.1°F | Resp 18

## 2023-11-07 DIAGNOSIS — N1832 Chronic kidney disease, stage 3b: Secondary | ICD-10-CM | POA: Insufficient documentation

## 2023-11-07 DIAGNOSIS — E538 Deficiency of other specified B group vitamins: Secondary | ICD-10-CM | POA: Insufficient documentation

## 2023-11-07 DIAGNOSIS — Z85038 Personal history of other malignant neoplasm of large intestine: Secondary | ICD-10-CM | POA: Insufficient documentation

## 2023-11-07 DIAGNOSIS — E860 Dehydration: Secondary | ICD-10-CM | POA: Insufficient documentation

## 2023-11-07 MED ORDER — CYANOCOBALAMIN 1000 MCG/ML IJ SOLN
1000.0000 ug | Freq: Once | INTRAMUSCULAR | Status: AC
  Administered 2023-11-07: 1000 ug via INTRAMUSCULAR
  Filled 2023-11-07: qty 1

## 2023-11-07 NOTE — Patient Instructions (Signed)
 Vitamin B12 Injection What is this medication? Vitamin B12 (VAHY tuh min B12) prevents and treats low vitamin B12 levels in your body. It is used in people who do not get enough vitamin B12 from their diet or when their digestive tract does not absorb enough. Vitamin B12 plays an important role in maintaining the health of your nervous system and red blood cells. This medicine may be used for other purposes; ask your health care provider or pharmacist if you have questions. COMMON BRAND NAME(S): B-12 Compliance Kit, B-12 Injection Kit, Cyomin, Dodex , LA-12, Nutri-Twelve, Physicians EZ Use B-12, Primabalt, Vitamin Deficiency Injectable System - B12 What should I tell my care team before I take this medication? They need to know if you have any of these conditions: Kidney disease Leber's disease Megaloblastic anemia An unusual or allergic reaction to cyanocobalamin , cobalt, other medications, foods, dyes, or preservatives Pregnant or trying to get pregnant Breast-feeding How should I use this medication? This medication is injected into a muscle or deeply under the skin. It is usually given in a clinic or care team's office. However, your care team may teach you how to inject yourself. Follow all instructions. Talk to your care team about the use of this medication in children. Special care may be needed. Overdosage: If you think you have taken too much of this medicine contact a poison control center or emergency room at once. NOTE: This medicine is only for you. Do not share this medicine with others. What if I miss a dose? If you are given your dose at a clinic or care team's office, call to reschedule your appointment. If you give your own injections, and you miss a dose, take it as soon as you can. If it is almost time for your next dose, take only that dose. Do not take double or extra doses. What may interact with this medication? Alcohol Colchicine This list may not describe all possible  interactions. Give your health care provider a list of all the medicines, herbs, non-prescription drugs, or dietary supplements you use. Also tell them if you smoke, drink alcohol, or use illegal drugs. Some items may interact with your medicine. What should I watch for while using this medication? Visit your care team regularly. You may need blood work done while you are taking this medication. You may need to follow a special diet. Talk to your care team. Limit your alcohol intake and avoid smoking to get the best benefit. What side effects may I notice from receiving this medication? Side effects that you should report to your care team as soon as possible: Allergic reactions--skin rash, itching, hives, swelling of the face, lips, tongue, or throat Swelling of the ankles, hands, or feet Trouble breathing Side effects that usually do not require medical attention (report to your care team if they continue or are bothersome): Diarrhea This list may not describe all possible side effects. Call your doctor for medical advice about side effects. You may report side effects to FDA at 1-800-FDA-1088. Where should I keep my medication? Keep out of the reach of children. Store at room temperature between 15 and 30 degrees C (59 and 85 degrees F). Protect from light. Throw away any unused medication after the expiration date. NOTE: This sheet is a summary. It may not cover all possible information. If you have questions about this medicine, talk to your doctor, pharmacist, or health care provider.  2024 Elsevier/Gold Standard (2020-10-28 00:00:00)

## 2023-11-18 ENCOUNTER — Other Ambulatory Visit (HOSPITAL_BASED_OUTPATIENT_CLINIC_OR_DEPARTMENT_OTHER): Payer: Self-pay

## 2023-11-18 MED ORDER — FAMOTIDINE 40 MG PO TABS
40.0000 mg | ORAL_TABLET | Freq: Every day | ORAL | 0 refills | Status: DC
  Filled 2023-11-18: qty 90, 90d supply, fill #0

## 2023-11-21 ENCOUNTER — Other Ambulatory Visit: Payer: Self-pay | Admitting: Cardiology

## 2023-11-21 ENCOUNTER — Other Ambulatory Visit (HOSPITAL_BASED_OUTPATIENT_CLINIC_OR_DEPARTMENT_OTHER): Payer: Self-pay

## 2023-11-21 MED ORDER — METOPROLOL SUCCINATE ER 25 MG PO TB24
12.5000 mg | ORAL_TABLET | Freq: Every day | ORAL | 2 refills | Status: AC
  Filled 2023-11-21: qty 45, 90d supply, fill #0
  Filled 2024-02-27: qty 45, 90d supply, fill #1

## 2023-11-28 ENCOUNTER — Ambulatory Visit: Payer: Medicare HMO

## 2023-11-28 ENCOUNTER — Other Ambulatory Visit: Payer: Self-pay | Admitting: Oncology

## 2023-11-28 DIAGNOSIS — Z85038 Personal history of other malignant neoplasm of large intestine: Secondary | ICD-10-CM

## 2023-11-29 ENCOUNTER — Other Ambulatory Visit: Payer: Self-pay

## 2023-11-29 ENCOUNTER — Other Ambulatory Visit: Payer: Self-pay | Admitting: Oncology

## 2023-11-29 ENCOUNTER — Inpatient Hospital Stay

## 2023-11-29 ENCOUNTER — Encounter: Payer: Self-pay | Admitting: Oncology

## 2023-11-29 ENCOUNTER — Inpatient Hospital Stay: Admitting: Oncology

## 2023-11-29 VITALS — BP 157/68 | HR 65 | Temp 97.8°F | Resp 16 | Ht 67.0 in | Wt 164.9 lb

## 2023-11-29 DIAGNOSIS — Z85038 Personal history of other malignant neoplasm of large intestine: Secondary | ICD-10-CM | POA: Diagnosis not present

## 2023-11-29 DIAGNOSIS — D539 Nutritional anemia, unspecified: Secondary | ICD-10-CM

## 2023-11-29 DIAGNOSIS — E538 Deficiency of other specified B group vitamins: Secondary | ICD-10-CM | POA: Diagnosis not present

## 2023-11-29 LAB — CMP (CANCER CENTER ONLY)
ALT: 10 U/L (ref 0–44)
AST: 22 U/L (ref 15–41)
Albumin: 3.9 g/dL (ref 3.5–5.0)
Alkaline Phosphatase: 47 U/L (ref 38–126)
Anion gap: 11 (ref 5–15)
BUN: 21 mg/dL (ref 8–23)
CO2: 25 mmol/L (ref 22–32)
Calcium: 9.9 mg/dL (ref 8.9–10.3)
Chloride: 106 mmol/L (ref 98–111)
Creatinine: 1.73 mg/dL — ABNORMAL HIGH (ref 0.44–1.00)
GFR, Estimated: 29 mL/min — ABNORMAL LOW (ref >60)
Glucose, Bld: 105 mg/dL — ABNORMAL HIGH (ref 70–99)
Potassium: 4.5 mmol/L (ref 3.5–5.1)
Sodium: 142 mmol/L (ref 135–145)
Total Bilirubin: 0.4 mg/dL (ref 0.0–1.2)
Total Protein: 6.5 g/dL (ref 6.5–8.1)

## 2023-11-29 LAB — CBC WITH DIFFERENTIAL (CANCER CENTER ONLY)
Abs Immature Granulocytes: 0.02 K/uL (ref 0.00–0.07)
Basophils Absolute: 0 K/uL (ref 0.0–0.1)
Basophils Relative: 1 %
Eosinophils Absolute: 0.5 K/uL (ref 0.0–0.5)
Eosinophils Relative: 9 %
HCT: 32.9 % — ABNORMAL LOW (ref 36.0–46.0)
Hemoglobin: 10.9 g/dL — ABNORMAL LOW (ref 12.0–15.0)
Immature Granulocytes: 0 %
Lymphocytes Relative: 28 %
Lymphs Abs: 1.5 K/uL (ref 0.7–4.0)
MCH: 30.6 pg (ref 26.0–34.0)
MCHC: 33.1 g/dL (ref 30.0–36.0)
MCV: 92.4 fL (ref 80.0–100.0)
Monocytes Absolute: 0.5 K/uL (ref 0.1–1.0)
Monocytes Relative: 9 %
Neutro Abs: 2.9 K/uL (ref 1.7–7.7)
Neutrophils Relative %: 53 %
Platelet Count: 152 K/uL (ref 150–400)
RBC: 3.56 MIL/uL — ABNORMAL LOW (ref 3.87–5.11)
RDW: 13 % (ref 11.5–15.5)
WBC Count: 5.5 K/uL (ref 4.0–10.5)
nRBC: 0 % (ref 0.0–0.2)

## 2023-11-29 LAB — IRON AND TIBC
Iron: 64 ug/dL (ref 28–170)
Saturation Ratios: 18 % (ref 10.4–31.8)
TIBC: 354 ug/dL (ref 250–450)
UIBC: 290 ug/dL

## 2023-11-29 LAB — CEA (ACCESS): CEA (CHCC): 4.97 ng/mL (ref 0.00–5.00)

## 2023-11-29 LAB — FERRITIN: Ferritin: 73 ng/mL (ref 11–307)

## 2023-11-29 NOTE — Progress Notes (Signed)
 [facility]  [address] 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  She was treated with surgical resection with findings of a 3.5 cm adenocarcinoma with 12 negative nodes.  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  Due to her stage of disease, she did not require adjuvant chemotherapy.  She has had intermittent mild elevation of the CEA of uncertain etiology.  Repeat imaging over the years has never revealed any evidence of recurrence.  Colonoscopy in January 2021 did not reveal any evidence of malignancy.  CT chest, abdomen and pelvis in January 2023 did not reveal any evidence of malignancy.  We did not recommend routine imaging. Repeat CT chest, abdomen and pelvis in December, 2024 did not reveal any evidence of malignancy.   Elevation of the CEA This has been a chronic finding and it fluctuates up and down with no evidence of cancer found. Her CEA went up to 6.18 in November 2024, which was the highest it has ever been.  She is a non-smoker.  Repeat CT chest, abdomen and pelvis in December, 2024 did not reveal any evidence of malignancy.  CEA was normal in March.  EGD and colonoscopy in April did not reveal any evidence of malignancy or source of bleeding.  Sigmoid diverticulosis was seen.  CEA is pending from today.     Chronic Kidney Disease, stage 3b (HCC) Worsening chronic kidney disease likely due to dehydration.  She did receive IV fluids in the past and I keep encouraging her to push oral fluids. She has never seen a nephrologist, so I will refer her at this time.   B12 deficiency B12 deficiency diagnosed in October 2018.  She remains on B12 injections monthly.  Her hemoglobin is fairly stable.  Her chronic kidney  disease may be contributing to her anemia.  Plan: She had a colonoscopy and EGD done on 06/23/2023 which was clear, it was recommended that she have this repeated in 5 years. She has a WBC of 5.5, low hemoglobin of 10.9 down from 11.1, and platelet count of 152,000. Her CMP is normal other than a chronic elevated creatinine of 1.73 up from 1.60. Her CEA today is pending and her last reading was normal at 5.00 in June. I will add ferritin, iron, and TIBC to her labs today. She continues receive monthly B-12 injections. I will see her back in 6 months with CBC, CMP, CEA, and SPEP. The patient understands the plans discussed today and is in agreement with them.  She knows to contact our office if she develops concerns prior to her next appointment.  I provided 15 minutes of face-to-face time during this encounter and > 50% was spent counseling as documented under my assessment and plan.   Wanda VEAR Cornish, MD  [facility] A DEPT OF [facility] [address] [place] [address] Dept: [phone] Dept Fax: [phone]   No orders of the defined types were placed in this encounter.   CHIEF COMPLAINT:  CC: History of stage I colon cancer  Current Treatment: Surveillance  HISTORY OF PRESENT ILLNESS:   Oncology History  Malignant neoplasm of transverse colon (HCC) (Resolved)  07/16/2015 Cancer Staging  Staging form: Colon and Rectum, AJCC 7th Edition - Clinical stage from 07/16/2015: Stage I (T2, N0, M0) - Signed by Cornelius Wanda DEL, MD on 01/30/2022 Staged by: Managing physician Diagnostic confirmation: Positive histology Specimen type: Excision Histopathologic type: Adenocarcinoma, NOS Stage prefix: Initial diagnosis Laterality: Right Tumor size (mm): 35 Histologic grade (G): G1 Lymph-vascular invasion (LVI): LVI not present (absent)/not identified Residual tumor (R): R0 - None Tumor deposits (TD): Absent Perineural  invasion (PNI): Absent Microsatellite instability (MSI): Unstable high KRAS gene analysis: Not assessed Prognostic indicators: MMR abnormal, BRAF V600E mutation positive, 12 neg nodes Stage used in treatment planning: Yes National guidelines used in treatment planning: Yes Type of national guideline used in treatment planning: NCCN   07/22/2015 Initial Diagnosis   Malignant neoplasm of transverse colon (HCC)     INTERVAL HISTORY:  Rebecca Orr is here today for repeat clinical assessment of her history of stage I colon cancer. 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 She has also had chronic fluctuating elevation of her CEA. Patient states that she feels well but complains of bilateral knee pain. She had a colonoscopy and EGD done on 06/23/2023 which was clear, it was recommended that she have this repeated in 5 years. She has a WBC of 5.5, low hemoglobin of 10.9 down from 11.1, and platelet count of 152,000. Her CMP is normal other than a chronic elevated creatinine of 1.73 up from 1.60. Her CEA today is pending. Her last reading was normal at 5.00 in June. I will add ferritin, iron, and TIBC to her labs today. She continues receive monthly B-12 injections. I will see her back in 6 months with CBC, CMP, CEA, and SPEP. She denies fever, chills, night sweats, or other signs of infection. She denies gastrointestinal issues. Her appetite is great and Her weight has been stable.   REVIEW OF SYSTEMS:   Review of Systems  Constitutional:  Negative for appetite change, chills, diaphoresis, fatigue, fever and unexpected weight change.  HENT:  Negative.  Negative for hearing loss, lump/mass, mouth sores, nosebleeds, sore throat, tinnitus, trouble swallowing and voice change.   Eyes: Negative.   Respiratory:  Positive for shortness of breath (with exertion). Negative for chest tightness, cough,  hemoptysis and wheezing.   Cardiovascular: Negative.  Negative for chest pain, leg swelling and palpitations.  Gastrointestinal: Negative.  Negative for abdominal distention, abdominal pain, blood in stool, constipation, diarrhea, nausea and vomiting.  Endocrine: Negative.  Negative for hot flashes.  Genitourinary: Negative.  Negative for difficulty urinating, dysuria, frequency and hematuria.   Musculoskeletal:  Positive for arthralgias (bilateral knee pain). Negative for back pain, flank pain, gait problem and myalgias.  Skin: Negative.  Negative for rash.  Neurological:  Positive for dizziness (intermittent). Negative for extremity weakness, gait problem, headaches, light-headedness, numbness, seizures and speech difficulty.  Hematological: Negative.  Negative for adenopathy. Does not bruise/bleed easily.  Psychiatric/Behavioral: Negative.  Negative for depression and sleep disturbance. The patient is not nervous/anxious.      VITALS:   Blood pressure (!) 157/68, pulse 65, temperature 97.8 F (36.6 C), temperature source Oral, resp. rate 16, height 5' 7 (1.702 m), weight 164 lb 14.4 oz (74.8 kg), SpO2 100%.  Wt Readings from Last 3 Encounters:  11/29/23 164 lb 14.4 oz (74.8 kg)  09/05/23 164 lb (74.4 kg)  08/10/23 164 lb (74.4 kg)    Body mass index  is 25.83 kg/m.  Performance status (ECOG): 1 - Symptomatic but completely ambulatory  PHYSICAL EXAM:   Physical Exam Vitals and nursing note reviewed.  Constitutional:      General: She is not in acute distress.    Appearance: Normal appearance. She is overweight. She is not ill-appearing.  HENT:     Head: Normocephalic and atraumatic.     Right Ear: Tympanic membrane, ear canal and external ear normal.     Left Ear: Tympanic membrane, ear canal and external ear normal.     Mouth/Throat:     Mouth: Mucous membranes are moist.     Pharynx: Oropharynx is clear. No oropharyngeal exudate or posterior oropharyngeal erythema.  Eyes:      General: No scleral icterus.    Extraocular Movements: Extraocular movements intact.     Conjunctiva/sclera: Conjunctivae normal.     Pupils: Pupils are equal, round, and reactive to light.  Cardiovascular:     Rate and Rhythm: Normal rate and regular rhythm.     Heart sounds: Normal heart sounds. No murmur heard.    No friction rub. No gallop.  Pulmonary:     Effort: Pulmonary effort is normal.     Breath sounds: Normal breath sounds. No wheezing, rhonchi or rales.  Abdominal:     General: There is no distension.     Palpations: Abdomen is soft. There is no mass.     Tenderness: There is no abdominal tenderness.  Musculoskeletal:        General: Normal range of motion.     Cervical back: Normal range of motion and neck supple. No tenderness.     Right lower leg: No edema.     Left lower leg: No edema.  Lymphadenopathy:     Cervical: No cervical adenopathy.  Skin:    General: Skin is warm and dry.     Coloration: Skin is not jaundiced.     Findings: No rash.     Comments: Skin turgor is decreased  Neurological:     General: No focal deficit present.     Mental Status: She is alert and oriented to person, place, and time. Mental status is at baseline.     Cranial Nerves: No cranial nerve deficit.  Psychiatric:        Mood and Affect: Mood normal.        Behavior: Behavior normal.        Thought Content: Thought content normal.     LABS:      Latest Ref Rng & Units 11/29/2023   10:10 AM 08/10/2023    1:59 PM 05/04/2023    1:20 PM  CBC  WBC 4.0 - 10.5 K/uL 5.5  6.1  6.3   Hemoglobin 12.0 - 15.0 g/dL 89.0  88.8  88.3   Hematocrit 36.0 - 46.0 % 32.9  33.5  33.9   Platelets 150 - 400 K/uL 152  188  194       Latest Ref Rng & Units 11/29/2023   10:10 AM 08/16/2023   11:09 AM 08/10/2023    1:59 PM  CMP  Glucose 70 - 99 mg/dL 894  891  886   BUN 8 - 23 mg/dL 21  19  27    Creatinine 0.44 - 1.00 mg/dL 8.26  8.39  8.12   Sodium 135 - 145 mmol/L 142  138  139   Potassium  3.5 - 5.1 mmol/L 4.5  4.3  3.9   Chloride 98 - 111 mmol/L 106  106  104   CO2 22 - 32 mmol/L 25  23  23    Calcium 8.9 - 10.3 mg/dL 9.9  89.6  89.5   Total Protein 6.5 - 8.1 g/dL 6.5   6.8   Total Bilirubin 0.0 - 1.2 mg/dL 0.4   0.4   Alkaline Phos 38 - 126 U/L 47   59   AST 15 - 41 U/L 22   21   ALT 0 - 44 U/L 10   7    Lab Results  Component Value Date   CEA1 4.7 08/10/2023   CEA 4.97 11/29/2023   /  CEA  Date Value Ref Range Status  08/10/2023 4.7 0.0 - 4.7 ng/mL Final    Comment:    (NOTE)                             Nonsmokers          <3.9                             Smokers             <5.6 [employer] Electrochemiluminescence Immunoassay (ECLIA) Values obtained with different assay methods or kits cannot be used interchangeably.  Results cannot be interpreted as absolute evidence of the presence or absence of malignant disease. Performed At: [facility] [address] [place], [place] [id number] Jennette Shorter MD Ey:[phone]    CEA ([facility])  Date Value Ref Range Status  11/29/2023 4.97 0.00 - 5.00 ng/mL Final    Comment:    (NOTE) This test was performed using Beckman Coulter's paramagnetic chemiluminescent immunoassay. Values obtained from different assay methods cannot be used interchangeably. Please note that up to 8% of patients who smoke may see values 5.1-10.0 ng/ml and 1% of patients who smoke may see CEA levels >10.0 ng/ml. Performed at [facility], [address], [place], [place] [address]    Lab Results  Component Value Date   TIBC 354 11/29/2023   TIBC 388 05/04/2023   TIBC 417 01/24/2023   FERRITIN 73 11/29/2023   FERRITIN 89 05/04/2023   FERRITIN 32 01/24/2023   IRONPCTSAT 18 11/29/2023   IRONPCTSAT 27 05/04/2023   IRONPCTSAT 22 01/24/2023   STUDIES:   No results found.    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   Ingrown right big toenail 05/23/2022   Irregular heart beats 06/28/2021   Mixed hyperlipidemia 01/01/2021   NSVT (nonsustained ventricular tachycardia) (HCC) 08/07/2021   Pain of lower extremity 05/20/2020   Personal history of colon cancer, stage I 07/18/2015   Transient ischemic attack    Weakness 06/28/2021    Past Surgical History:  Procedure Laterality Date   CATARACT EXTRACTION     CHOLECYSTECTOMY     HEMICOLECTOMY  2017   SPHINCTEROTOMY  2011    Family History  Problem Relation Age of Onset   Dementia  Mother    Lung cancer Father    Heart disease Father    Renal cancer Sister    Prostate cancer Brother    Breast cancer Paternal Grandmother     Social History:  reports that she has never smoked. She has never used smokeless tobacco. She reports that she does not drink alcohol and does not use drugs.The patient is alone today.  Allergies:  Allergies  Allergen Reactions   Nsaids Other (See Comments)    GI Bleed    Aspirin Other (See Comments)   Atenolol Other (See Comments)   Naproxen Other (See Comments)   Propranolol Other (See Comments)    Unknown   Tizanidine  Other (See Comments)    Dizziness     Current Medications: Current Outpatient Medications  Medication Sig Dispense Refill   Calcium Carbonate-Vitamin D  (CALCIUM-VITAMIN D  PO) Take 1 tablet by mouth 2 (two) times daily.     Cyanocobalamin  (B-12 IJ) Inject as directed every 30 (thirty) days.     ergocalciferol  (VITAMIN D2) 1.25 MG  (50000 UT) capsule Take 1 capsule (50,000 Units total) by mouth once a week. 12 capsule 0   escitalopram  (LEXAPRO ) 5 MG tablet Take 1 tablet (5 mg total) by mouth daily. 90 tablet 0   famotidine  (PEPCID ) 40 MG tablet Take 1 tablet (40 mg total) by mouth daily. 90 tablet 0   Glucosamine Sulfate 500 MG TABS Take 500 mg by mouth daily.     metoprolol  succinate (TOPROL  XL) 25 MG 24 hr tablet Take 0.5 tablets (12.5 mg total) by mouth daily. 45 tablet 2   Current Facility-Administered Medications  Medication Dose Route Frequency Provider Last Rate Last Admin   triamcinolone  acetonide (KENALOG -40) injection 80 mg  80 mg Intra-articular Once Sherre Clapper, MD        LILLETTE Remak M Lassiter,acting as a scribe for Wanda VEAR Cornish, MD.,have documented all relevant documentation on the behalf of Wanda VEAR Cornish, MD,as directed by  Wanda VEAR Cornish, MD while in the presence of Wanda VEAR Cornish, MD.

## 2023-12-02 ENCOUNTER — Other Ambulatory Visit (HOSPITAL_BASED_OUTPATIENT_CLINIC_OR_DEPARTMENT_OTHER): Payer: Self-pay

## 2023-12-02 MED ORDER — ESCITALOPRAM OXALATE 5 MG PO TABS
5.0000 mg | ORAL_TABLET | Freq: Every day | ORAL | 0 refills | Status: DC
  Filled 2023-12-02: qty 90, 90d supply, fill #0

## 2023-12-05 ENCOUNTER — Inpatient Hospital Stay: Attending: Oncology

## 2023-12-05 VITALS — BP 126/65 | HR 68 | Resp 18 | Ht 67.0 in

## 2023-12-05 DIAGNOSIS — E538 Deficiency of other specified B group vitamins: Secondary | ICD-10-CM | POA: Insufficient documentation

## 2023-12-05 MED ORDER — CYANOCOBALAMIN 1000 MCG/ML IJ SOLN
1000.0000 ug | Freq: Once | INTRAMUSCULAR | Status: AC
  Administered 2023-12-05: 1000 ug via INTRAMUSCULAR
  Filled 2023-12-05: qty 1

## 2023-12-05 NOTE — Patient Instructions (Signed)
 Vitamin B12 Injection What is this medication? Vitamin B12 (VAHY tuh min B12) prevents and treats low vitamin B12 levels in your body. It is used in people who do not get enough vitamin B12 from their diet or when their digestive tract does not absorb enough. Vitamin B12 plays an important role in maintaining the health of your nervous system and red blood cells. This medicine may be used for other purposes; ask your health care provider or pharmacist if you have questions. COMMON BRAND NAME(S): B-12 Compliance Kit, B-12 Injection Kit, Cyomin, Dodex , LA-12, Nutri-Twelve, Physicians EZ Use B-12, Primabalt, Vitamin Deficiency Injectable System - B12 What should I tell my care team before I take this medication? They need to know if you have any of these conditions: Kidney disease Leber's disease Megaloblastic anemia An unusual or allergic reaction to cyanocobalamin , cobalt, other medications, foods, dyes, or preservatives Pregnant or trying to get pregnant Breast-feeding How should I use this medication? This medication is injected into a muscle or deeply under the skin. It is usually given in a clinic or care team's office. However, your care team may teach you how to inject yourself. Follow all instructions. Talk to your care team about the use of this medication in children. Special care may be needed. Overdosage: If you think you have taken too much of this medicine contact a poison control center or emergency room at once. NOTE: This medicine is only for you. Do not share this medicine with others. What if I miss a dose? If you are given your dose at a clinic or care team's office, call to reschedule your appointment. If you give your own injections, and you miss a dose, take it as soon as you can. If it is almost time for your next dose, take only that dose. Do not take double or extra doses. What may interact with this medication? Alcohol Colchicine This list may not describe all possible  interactions. Give your health care provider a list of all the medicines, herbs, non-prescription drugs, or dietary supplements you use. Also tell them if you smoke, drink alcohol, or use illegal drugs. Some items may interact with your medicine. What should I watch for while using this medication? Visit your care team regularly. You may need blood work done while you are taking this medication. You may need to follow a special diet. Talk to your care team. Limit your alcohol intake and avoid smoking to get the best benefit. What side effects may I notice from receiving this medication? Side effects that you should report to your care team as soon as possible: Allergic reactions--skin rash, itching, hives, swelling of the face, lips, tongue, or throat Swelling of the ankles, hands, or feet Trouble breathing Side effects that usually do not require medical attention (report to your care team if they continue or are bothersome): Diarrhea This list may not describe all possible side effects. Call your doctor for medical advice about side effects. You may report side effects to FDA at 1-800-FDA-1088. Where should I keep my medication? Keep out of the reach of children. Store at room temperature between 15 and 30 degrees C (59 and 85 degrees F). Protect from light. Throw away any unused medication after the expiration date. NOTE: This sheet is a summary. It may not cover all possible information. If you have questions about this medicine, talk to your doctor, pharmacist, or health care provider.  2024 Elsevier/Gold Standard (2020-10-28 00:00:00)

## 2023-12-06 ENCOUNTER — Encounter: Payer: Self-pay | Admitting: Hematology and Oncology

## 2023-12-12 ENCOUNTER — Other Ambulatory Visit (HOSPITAL_BASED_OUTPATIENT_CLINIC_OR_DEPARTMENT_OTHER): Payer: Self-pay

## 2023-12-12 ENCOUNTER — Other Ambulatory Visit: Payer: Self-pay

## 2023-12-12 MED ORDER — VITAMIN D (ERGOCALCIFEROL) 1.25 MG (50000 UNIT) PO CAPS
50000.0000 [IU] | ORAL_CAPSULE | ORAL | 0 refills | Status: DC
  Filled 2023-12-12: qty 12, 84d supply, fill #0

## 2023-12-13 ENCOUNTER — Telehealth: Payer: Self-pay

## 2023-12-13 NOTE — Telephone Encounter (Signed)
 Attempted to contact patient. No answer.

## 2023-12-13 NOTE — Telephone Encounter (Signed)
-----   Message from Wanda VEAR Cornish sent at 12/06/2023  7:49 PM EDT ----- Regarding: call Tell her CEA is slightly better at 4.97 and iron levels are normal.  Send copies to Dr. Gable

## 2023-12-19 ENCOUNTER — Ambulatory Visit: Admitting: Podiatry

## 2023-12-19 ENCOUNTER — Encounter: Payer: Self-pay | Admitting: Podiatry

## 2023-12-19 DIAGNOSIS — B351 Tinea unguium: Secondary | ICD-10-CM | POA: Diagnosis not present

## 2023-12-19 DIAGNOSIS — M79675 Pain in left toe(s): Secondary | ICD-10-CM

## 2023-12-19 DIAGNOSIS — M79674 Pain in right toe(s): Secondary | ICD-10-CM

## 2023-12-19 NOTE — Progress Notes (Signed)
  Subjective:  Patient ID: Rebecca Orr, female    DOB: 1941/01/14,  MRN: [id number]  Chief Complaint  Patient presents with   RFC    RFC, does have a corn on Left foot between Sub-met 1 and 2.  Not diabetic No anti coag.     83 y.o. female presents with the above complaint. History confirmed with patient. Patient presenting with pain related to dystrophic thickened elongated nails. Patient is unable to trim own nails related to nail dystrophy and/or mobility issues. Patient does not have a history of T2DM.  Previously had been using ciclopirox  for the toenails.  Still dealing with dystrophic changes.  Does have small corn that is not really bothering her much at this point today.  Objective:  Physical Exam: warm, good capillary refill nail exam onychomycosis of the toenails, onycholysis, and dystrophic nails mostly affecting the distal aspects of the nails. DP pulses palpable, PT pulses palpable, and protective sensation intact Left Foot:  Pain with palpation of nails due to elongation and dystrophic growth.   Right Foot: Pain with palpation of nails due to elongation and dystrophic growth.   There are some residual nail dystrophy and possible mycotic changes.  Difficult to determine if this is recurring Assessment:   1. Pain due to onychomycosis of toenails of both feet      Plan:  Patient was evaluated and treated and all questions answered.   #Onychomycosis with pain  -Nails palliatively debrided as below. -Educated on self-care - Monitor for signs of recurrence of the fungal nails, there is some continued nail dystrophy.  She does have some Penlac  remaining and can resume this if she feels that nails are becoming more thickened, dystrophic with subungual debris   Procedure: Nail Debridement Rationale: Pain Type of Debridement: manual, sharp debridement. Instrumentation: Nail nipper, rotary burr. Number of Nails: 10  Return in about 3 months (around  03/20/2024) for Routine Foot Care.         Ethan Saddler, DPM [facility] / [facility]

## 2023-12-26 ENCOUNTER — Ambulatory Visit: Payer: Medicare HMO

## 2023-12-27 ENCOUNTER — Other Ambulatory Visit (HOSPITAL_BASED_OUTPATIENT_CLINIC_OR_DEPARTMENT_OTHER): Payer: Self-pay

## 2023-12-27 DIAGNOSIS — R2681 Unsteadiness on feet: Secondary | ICD-10-CM | POA: Diagnosis not present

## 2023-12-27 DIAGNOSIS — R42 Dizziness and giddiness: Secondary | ICD-10-CM | POA: Diagnosis not present

## 2023-12-27 DIAGNOSIS — E559 Vitamin D deficiency, unspecified: Secondary | ICD-10-CM | POA: Diagnosis not present

## 2023-12-27 DIAGNOSIS — I10 Essential (primary) hypertension: Secondary | ICD-10-CM | POA: Diagnosis not present

## 2023-12-27 DIAGNOSIS — Z6825 Body mass index (BMI) 25.0-25.9, adult: Secondary | ICD-10-CM | POA: Diagnosis not present

## 2023-12-27 DIAGNOSIS — R7309 Other abnormal glucose: Secondary | ICD-10-CM | POA: Diagnosis not present

## 2023-12-27 DIAGNOSIS — F419 Anxiety disorder, unspecified: Secondary | ICD-10-CM | POA: Diagnosis not present

## 2023-12-27 DIAGNOSIS — K219 Gastro-esophageal reflux disease without esophagitis: Secondary | ICD-10-CM | POA: Diagnosis not present

## 2023-12-27 DIAGNOSIS — D649 Anemia, unspecified: Secondary | ICD-10-CM | POA: Diagnosis not present

## 2023-12-27 DIAGNOSIS — M159 Polyosteoarthritis, unspecified: Secondary | ICD-10-CM | POA: Diagnosis not present

## 2023-12-27 DIAGNOSIS — M1711 Unilateral primary osteoarthritis, right knee: Secondary | ICD-10-CM | POA: Diagnosis not present

## 2023-12-27 MED ORDER — ACCU-CHEK GUIDE W/DEVICE KIT
PACK | Freq: Every day | 0 refills | Status: AC
  Filled 2023-12-27: qty 1, 1d supply, fill #0

## 2023-12-27 MED ORDER — ACCU-CHEK GUIDE TEST VI STRP
ORAL_STRIP | Freq: Every day | 0 refills | Status: AC
  Filled 2023-12-27: qty 100, 90d supply, fill #0

## 2023-12-27 MED ORDER — ACCU-CHEK SOFTCLIX LANCETS MISC
Freq: Every day | 0 refills | Status: AC
  Filled 2023-12-27: qty 100, 90d supply, fill #0

## 2024-01-02 ENCOUNTER — Inpatient Hospital Stay: Attending: Oncology

## 2024-01-02 VITALS — BP 127/65 | HR 70 | Temp 98.0°F | Resp 18

## 2024-01-02 DIAGNOSIS — E538 Deficiency of other specified B group vitamins: Secondary | ICD-10-CM | POA: Diagnosis not present

## 2024-01-02 MED ORDER — CYANOCOBALAMIN 1000 MCG/ML IJ SOLN
1000.0000 ug | Freq: Once | INTRAMUSCULAR | Status: AC
  Administered 2024-01-02: 1000 ug via INTRAMUSCULAR
  Filled 2024-01-02: qty 1

## 2024-01-23 ENCOUNTER — Ambulatory Visit: Payer: Medicare HMO

## 2024-01-24 ENCOUNTER — Other Ambulatory Visit: Payer: Medicare HMO

## 2024-01-24 ENCOUNTER — Ambulatory Visit: Payer: Medicare HMO | Admitting: Oncology

## 2024-01-30 ENCOUNTER — Inpatient Hospital Stay: Attending: Oncology

## 2024-01-30 VITALS — BP 134/54 | HR 65 | Temp 98.5°F | Resp 18 | Ht 67.0 in | Wt 164.1 lb

## 2024-01-30 DIAGNOSIS — E538 Deficiency of other specified B group vitamins: Secondary | ICD-10-CM | POA: Diagnosis present

## 2024-01-30 DIAGNOSIS — Z85038 Personal history of other malignant neoplasm of large intestine: Secondary | ICD-10-CM | POA: Insufficient documentation

## 2024-01-30 MED ORDER — CYANOCOBALAMIN 1000 MCG/ML IJ SOLN
1000.0000 ug | Freq: Once | INTRAMUSCULAR | Status: AC
  Administered 2024-01-30: 1000 ug via INTRAMUSCULAR
  Filled 2024-01-30: qty 1

## 2024-01-30 NOTE — Patient Instructions (Signed)
 Vitamin B12 Injection What is this medication? Vitamin B12 (VAHY tuh min B12) prevents and treats low vitamin B12 levels in your body. It is used in people who do not get enough vitamin B12 from their diet or when their digestive tract does not absorb enough. Vitamin B12 plays an important role in maintaining the health of your nervous system and red blood cells. This medicine may be used for other purposes; ask your health care provider or pharmacist if you have questions. COMMON BRAND NAME(S): B-12 Compliance Kit, B-12 Injection Kit, Cyomin, Dodex , LA-12, Nutri-Twelve, Physicians EZ Use B-12, Primabalt, Vitamin Deficiency Injectable System - B12 What should I tell my care team before I take this medication? They need to know if you have any of these conditions: Kidney disease Leber's disease Megaloblastic anemia An unusual or allergic reaction to cyanocobalamin , cobalt, other medications, foods, dyes, or preservatives Pregnant or trying to get pregnant Breast-feeding How should I use this medication? This medication is injected into a muscle or deeply under the skin. It is usually given in a clinic or care team's office. However, your care team may teach you how to inject yourself. Follow all instructions. Talk to your care team about the use of this medication in children. Special care may be needed. Overdosage: If you think you have taken too much of this medicine contact a poison control center or emergency room at once. NOTE: This medicine is only for you. Do not share this medicine with others. What if I miss a dose? If you are given your dose at a clinic or care team's office, call to reschedule your appointment. If you give your own injections, and you miss a dose, take it as soon as you can. If it is almost time for your next dose, take only that dose. Do not take double or extra doses. What may interact with this medication? Alcohol Colchicine This list may not describe all possible  interactions. Give your health care provider a list of all the medicines, herbs, non-prescription drugs, or dietary supplements you use. Also tell them if you smoke, drink alcohol, or use illegal drugs. Some items may interact with your medicine. What should I watch for while using this medication? Visit your care team regularly. You may need blood work done while you are taking this medication. You may need to follow a special diet. Talk to your care team. Limit your alcohol intake and avoid smoking to get the best benefit. What side effects may I notice from receiving this medication? Side effects that you should report to your care team as soon as possible: Allergic reactions--skin rash, itching, hives, swelling of the face, lips, tongue, or throat Swelling of the ankles, hands, or feet Trouble breathing Side effects that usually do not require medical attention (report to your care team if they continue or are bothersome): Diarrhea This list may not describe all possible side effects. Call your doctor for medical advice about side effects. You may report side effects to FDA at 1-800-FDA-1088. Where should I keep my medication? Keep out of the reach of children. Store at room temperature between 15 and 30 degrees C (59 and 85 degrees F). Protect from light. Throw away any unused medication after the expiration date. NOTE: This sheet is a summary. It may not cover all possible information. If you have questions about this medicine, talk to your doctor, pharmacist, or health care provider.  2024 Elsevier/Gold Standard (2020-10-28 00:00:00)

## 2024-02-01 ENCOUNTER — Ambulatory Visit: Attending: Cardiology | Admitting: Cardiology

## 2024-02-01 ENCOUNTER — Ambulatory Visit: Attending: Cardiology

## 2024-02-01 ENCOUNTER — Encounter: Payer: Self-pay | Admitting: Cardiology

## 2024-02-01 VITALS — BP 118/70 | HR 74 | Ht 67.0 in | Wt 164.4 lb

## 2024-02-01 DIAGNOSIS — I700 Atherosclerosis of aorta: Secondary | ICD-10-CM

## 2024-02-01 DIAGNOSIS — R002 Palpitations: Secondary | ICD-10-CM | POA: Insufficient documentation

## 2024-02-01 DIAGNOSIS — R0609 Other forms of dyspnea: Secondary | ICD-10-CM | POA: Diagnosis not present

## 2024-02-01 DIAGNOSIS — E782 Mixed hyperlipidemia: Secondary | ICD-10-CM | POA: Diagnosis not present

## 2024-02-01 DIAGNOSIS — I2510 Atherosclerotic heart disease of native coronary artery without angina pectoris: Secondary | ICD-10-CM

## 2024-02-01 NOTE — Progress Notes (Signed)
 Cardiology Office Note:    Date:  02/01/2024   ID:  Rebecca Orr, DOB Nov 25, 1940, MRN [id number]  PCP:  Gable Cambric, MD  Cardiologist:  Jennifer JONELLE Crape, MD   Referring MD: Gable Cambric, MD    ASSESSMENT:    1. Abdominal aortic atherosclerosis   2. Atherosclerosis of native coronary artery of native heart without angina pectoris   3. Mixed hyperlipidemia   4. Palpitations   5. Dyspnea on exertion   6. DOE (dyspnea on exertion)    PLAN:    In order of problems listed above:  Coronary artery disease: Secondary prevention stressed with the patient.  Importance of compliance with diet medication stressed and patient verbalized standing. Dyspnea on exertion: Patient has risk factors for coronary artery disease so we will do Lexiscan  sestamibi.  I was considering CT coronary angiography but she has significant renal insufficiency and she is not too keen taking the risk of the possibility of contrast-induced insult to her kidneys. Palpitations: Will do a 2-week monitor.  She tells me that she has significant palpitations especially when she exerts. Essential hypertension: Blood pressure stable and diet was emphasized.  Lifestyle modification urged.  Her blood pressures from home are fine. Mixed dyslipidemia: She is not keen on lipid-lowering medications.  She is concerned about this.  I explained benefits of this.  She is still not keen on any aspect of issues. Patient will be seen in follow-up appointment in 6 months or earlier if the patient has any concerns.    Medication Adjustments/Labs and Tests Ordered: Current medicines are reviewed at length with the patient today.  Concerns regarding medicines are outlined above.  Orders Placed This Encounter  Procedures   LONG TERM MONITOR (3-14 DAYS)   MYOCARDIAL PERFUSION IMAGING   No orders of the defined types were placed in this encounter.    No chief complaint on file.    History of Present Illness:    Rebecca  Dianne Mahalia Orr is a 83 y.o. female.  She has past medical history of aortic atherosclerosis, coronary artery calcification, essential hypertension mixed dyslipidemia.  She mentions to me that she has had palpitations on and off and when she tries to exert herself she has some dyspnea on exertion.  She has renal insufficiency.  She denies chest pain orthopnea or PND.  At the time of my evaluation, the patient is alert awake oriented and in no distress.  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   Ingrown right big toenail 05/23/2022   Irregular heart beats 06/28/2021   Mixed hyperlipidemia 01/01/2021   NSVT (nonsustained ventricular tachycardia) (HCC) 08/07/2021   Pain of lower extremity 05/20/2020   Personal history of colon cancer, stage I 07/18/2015   Transient ischemic attack    Weakness 06/28/2021    Past Surgical History:  Procedure Laterality Date   CATARACT EXTRACTION     CHOLECYSTECTOMY     HEMICOLECTOMY  2017   SPHINCTEROTOMY  2011  Current Medications: Current Meds  Medication Sig   Accu-Chek Softclix Lancets lancets Use to check blood sugar once daily as directed   Blood Glucose Monitoring Suppl (ACCU-CHEK GUIDE) w/Device KIT Use to check blood sugar once daily as directed   Calcium Carbonate-Vitamin D  (CALCIUM-VITAMIN D  PO) Take 1 tablet by mouth 2 (two) times daily.    Cyanocobalamin  (B-12 IJ) Inject as directed every 30 (thirty) days.   escitalopram  (LEXAPRO ) 5 MG tablet Take 1 tablet (5 mg total) by mouth daily.   famotidine  (PEPCID ) 40 MG tablet Take 1 tablet (40 mg total) by mouth daily.   Glucosamine Sulfate 500 MG TABS Take 500 mg by mouth daily.   glucose blood (ACCU-CHEK GUIDE TEST) test strip Use to check blood suagr daily as directed   metoprolol  succinate (TOPROL  XL) 25 MG 24 hr tablet Take 0.5 tablets (12.5 mg total) by mouth daily.   Vitamin D , Ergocalciferol , (DRISDOL ) 1.25 MG (50000 UNIT) CAPS capsule Take 1 capsule (50,000 Units total) by mouth once a week.   Current Facility-Administered Medications for the 02/01/24 encounter (Office Visit) with Melquisedec Journey R, MD  Medication   triamcinolone  acetonide (KENALOG -40) injection 80 mg     Allergies:   Nsaids, Aspirin, Atenolol, Naproxen, Propranolol, and Tizanidine    Social History   Socioeconomic History   Marital status: Widowed    Spouse name: Not on file   Number of children: Not on file   Years of education: Not on file   Highest education level: Not on file  Occupational History   Not on file  Tobacco Use   Smoking status: Never   Smokeless tobacco: Never  Vaping Use   Vaping status: Never Used  Substance and Sexual Activity   Alcohol use: Never   Drug use: Never   Sexual activity: Not on file  Other Topics Concern   Not on file  Social History Narrative   Not on file   Social Drivers of Health   Financial Resource Strain: Low Risk  (09/21/2022)   Overall Financial Resource Strain (CARDIA)    Difficulty of Paying Living Expenses: Not hard at all  Food Insecurity: No Food Insecurity (09/21/2022)   Hunger Vital Sign    Worried About Running Out of Food in the Last Year: Never true    Ran Out of Food in the Last Year: Never true  Transportation Needs: No Transportation Needs (09/21/2022)   PRAPARE - Administrator, Civil Service (Medical): No    Lack of  Transportation (Non-Medical): No  Physical Activity: Insufficiently Active (09/21/2022)   Exercise Vital Sign    Days of Exercise per Week: 3 days    Minutes of Exercise per Session: 30 min  Stress: No Stress Concern Present (09/21/2022)   [employer] of Occupational Health - Occupational Stress Questionnaire    Feeling of Stress : Not at all  Social Connections: Moderately Integrated (09/21/2022)   Social Connection and Isolation Panel    Frequency of Communication with Friends and Family: More than three times a week    Frequency of Social Gatherings with Friends and Family: More than three times a week    Attends Religious Services: More than 4 times per year    Active Member of [employer] or Organizations: Yes    Attends Banker Meetings: More than 4 times per year    Marital Status: Widowed     Family History: The patient's family history includes Breast cancer in her paternal grandmother; Dementia in  her mother; Heart disease in her father; Lung cancer in her father; Prostate cancer in her brother; Renal cancer in her sister.  ROS:   Please see the history of present illness.    All other systems reviewed and are negative.  EKGs/Labs/Other Studies Reviewed:    The following studies were reviewed today: .SABRA   I discussed my findings with the patient at length   Recent Labs: 11/29/2023: ALT 10; BUN 21; Creatinine 1.73; Hemoglobin 10.9; Platelet Count 152; Potassium 4.5; Sodium 142  Recent Lipid Panel    Component Value Date/Time   CHOL 196 05/21/2022 1124   TRIG 97 05/21/2022 1124   HDL 70 05/21/2022 1124   CHOLHDL 2.8 05/21/2022 1124   LDLCALC 109 (H) 05/21/2022 1124    Physical Exam:    VS:  BP (!) 142/78   Pulse 74   Ht 5' 7 (1.702 m)   Wt 164 lb 6.4 oz (74.6 kg)   SpO2 99%   BMI 25.75 kg/m     Wt Readings from Last 3 Encounters:  02/01/24 164 lb 6.4 oz (74.6 kg)  01/30/24 164 lb 1.9 oz (74.4 kg)  11/29/23 164 lb 14.4 oz (74.8 kg)     GEN:  Patient is in no acute distress HEENT: Normal NECK: No JVD; No carotid bruits LYMPHATICS: No lymphadenopathy CARDIAC: Hear sounds regular, 2/6 systolic murmur at the apex. RESPIRATORY:  Clear to auscultation without rales, wheezing or rhonchi  ABDOMEN: Soft, non-tender, non-distended MUSCULOSKELETAL:  No edema; No deformity  SKIN: Warm and dry NEUROLOGIC:  Alert and oriented x 3 PSYCHIATRIC:  Normal affect   Signed, Jennifer JONELLE Crape, MD  02/01/2024 10:21 AM    [facility]

## 2024-02-01 NOTE — Patient Instructions (Signed)
 Medication Instructions:  Your physician recommends that you continue on your current medications as directed. Please refer to the Current Medication list given to you today.  *If you need a refill on your cardiac medications before your next appointment, please call your pharmacy*   Lab Work: None ordered If you have labs (blood work) drawn today and your tests are completely normal, you will receive your results only by: MyChart Message (if you have MyChart) OR A paper copy in the mail If you have any lab test that is abnormal or we need to change your treatment, we will call you to review the results.   Testing/Procedures: You are scheduled for a Myocardial Perfusion Imaging Study.  Please arrive 15 minutes prior to your appointment time for registration and insurance purposes.  The test will take approximately 3 to 4 hours to complete; you may bring reading material.  If someone comes with you to your appointment, they will need to remain in the main lobby due to limited space in the testing area.   How to prepare for your Myocardial Perfusion Test: Do not eat or drink 3 hours prior to your test, except you may have water. Do not consume products containing caffeine (regular or decaffeinated) 12 hours prior to your test. (ex: coffee, chocolate, sodas, tea). Do bring a list of your current medications with you.  If not listed below, you may take your medications as normal. Do not take metoprolol  (Lopressor , Toprol ) for 24 hours prior to the test.  Bring the medication to your appointment as you may be required to take it once the test is complete. Do wear comfortable clothes (no dresses or overalls) and walking shoes, tennis shoes preferred (No heels or open toe shoes are allowed). Do NOT wear cologne, perfume, aftershave, or lotions (deodorant is allowed). If these instructions are not followed, your test will have to be rescheduled.  If you cannot keep your appointment, please  provide 24 hours notification to the Nuclear Lab, to avoid a possible $50 charge to your account.  Follow-Up: At [facility], you and your health needs are our priority.  As part of our continuing mission to provide you with exceptional heart care, we have created designated Provider Care Teams.  These Care Teams include your primary Cardiologist (physician) and Advanced Practice Providers (APPs -  Physician Assistants and Nurse Practitioners) who all work together to provide you with the care you need, when you need it.  We recommend signing up for the patient portal called MyChart.  Sign up information is provided on this After Visit Summary.  MyChart is used to connect with patients for Virtual Visits (Telemedicine).  Patients are able to view lab/test results, encounter notes, upcoming appointments, etc.  Non-urgent messages can be sent to your provider as well.   To learn more about what you can do with MyChart, go to [email or web address].    Your next appointment:   3 month(s)  Provider:   Jennifer Crape, MD   Other Instructions  Cardiac Nuclear Scan A cardiac nuclear scan is a test that is done to check the flow of blood to your heart. It is done when you are resting and when you are exercising. The test looks for problems such as: Not enough blood reaching a portion of the heart. The heart muscle not working as it should. You may need this test if you have: Heart disease. Lab results that are not normal. Had heart surgery or a balloon procedure to  open up blocked arteries (angioplasty) or a small mesh tube (stent). Chest pain. Shortness of breath. Had a heart attack. In this test, a special dye (tracer) is put into your bloodstream. The tracer will travel to your heart. A camera will then take pictures of your heart to see how the tracer moves through your heart. This test is usually done at a hospital and takes 2-4 hours. Tell a doctor about: Any allergies you  have. All medicines you are taking, including vitamins, herbs, eye drops, creams, and over-the-counter medicines. Any bleeding problems you have. Any surgeries you have had. Any medical conditions you have. Whether you are pregnant or may be pregnant. Any history of asthma or long-term (chronic) lung disease. Any history of heart rhythm disorders or heart valve conditions. What are the risks? Your doctor will talk with you about risks. These may include: Serious chest pain and heart attack. This is only a risk if the stress portion of the test is done. Fast or uneven heartbeats (palpitations). A feeling of warmth in your chest. This feeling usually does not last long. Allergic reaction to the tracer. Shortness of breath or trouble breathing. What happens before the test? Ask your doctor about changing or stopping your normal medicines. Follow instructions from your doctor about what you cannot eat or drink. Remove your jewelry on the day of the test. Ask your doctor if you need to avoid nicotine or caffeine. What happens during the test? An IV tube will be inserted into one of your veins. Your doctor will give you a small amount of tracer through the IV tube. You will wait for 20-40 minutes while the tracer moves through your bloodstream. Your heart will be monitored with an electrocardiogram (ECG). You will lie down on an exam table. Pictures of your heart will be taken for about 15-20 minutes. You may also have a stress test. For this test, one of these things may be done: You will be asked to exercise on a treadmill or a stationary bike. You will be given medicines that will make your heart work harder. This is done if you are unable to exercise. When blood flow to your heart has peaked, a tracer will again be given through the IV tube. After 20-40 minutes, you will get back on the exam table. More pictures will be taken of your heart. Depending on the tracer that is used, more  pictures may need to be taken 3-4 hours later. Your IV tube will be removed when the test is over. The test may vary among doctors and hospitals. What happens after the test? Ask your doctor: Whether you can return to your normal schedule, including diet, activities, travel, and medicines. Whether you should drink more fluids. This will help to remove the tracer from your body. Ask your doctor, or the department that is doing the test: When will my results be ready? How will I get my results? What are my treatment options? What other tests do I need? What are my next steps? This information is not intended to replace advice given to you by your health care provider. Make sure you discuss any questions you have with your health care provider. Document Revised: 07/14/2021 Document Reviewed: 07/14/2021 Elsevier Patient Education  2023 [employer].

## 2024-02-02 ENCOUNTER — Telehealth (HOSPITAL_COMMUNITY): Payer: Self-pay | Admitting: *Deleted

## 2024-02-02 NOTE — Telephone Encounter (Signed)
 Spoke to patient as a reminder about her STRESS TEST on 02/07/24 at 8:15

## 2024-02-07 ENCOUNTER — Ambulatory Visit: Attending: Cardiology

## 2024-02-07 DIAGNOSIS — R0609 Other forms of dyspnea: Secondary | ICD-10-CM

## 2024-02-07 MED ORDER — TECHNETIUM TC 99M TETROFOSMIN IV KIT
31.5000 | PACK | Freq: Once | INTRAVENOUS | Status: AC | PRN
  Administered 2024-02-07: 31.5 via INTRAVENOUS

## 2024-02-07 MED ORDER — TECHNETIUM TC 99M TETROFOSMIN IV KIT
10.1000 | PACK | Freq: Once | INTRAVENOUS | Status: AC | PRN
  Administered 2024-02-07: 10.1 via INTRAVENOUS

## 2024-02-07 MED ORDER — REGADENOSON 0.4 MG/5ML IV SOLN
0.4000 mg | Freq: Once | INTRAVENOUS | Status: AC
  Administered 2024-02-07: 0.4 mg via INTRAVENOUS

## 2024-02-15 ENCOUNTER — Ambulatory Visit: Payer: Self-pay | Admitting: Cardiology

## 2024-02-15 LAB — MYOCARDIAL PERFUSION IMAGING
LV dias vol: 54 mL (ref 46–106)
LV sys vol: 12 mL (ref 3.8–5.2)
Nuc Stress EF: 77 %
Peak HR: 93 {beats}/min
Rest HR: 60 {beats}/min
Rest Nuclear Isotope Dose: 10.1 mCi
SDS: 0
SRS: 0
SSS: 0
Stress Nuclear Isotope Dose: 31.5 mCi
TID: 0.93

## 2024-02-16 ENCOUNTER — Other Ambulatory Visit (HOSPITAL_BASED_OUTPATIENT_CLINIC_OR_DEPARTMENT_OTHER): Payer: Self-pay

## 2024-02-16 MED ORDER — FAMOTIDINE 40 MG PO TABS
40.0000 mg | ORAL_TABLET | Freq: Every day | ORAL | 0 refills | Status: AC
  Filled 2024-02-16: qty 90, 90d supply, fill #0

## 2024-02-20 ENCOUNTER — Other Ambulatory Visit (HOSPITAL_BASED_OUTPATIENT_CLINIC_OR_DEPARTMENT_OTHER): Payer: Self-pay

## 2024-02-21 ENCOUNTER — Other Ambulatory Visit (HOSPITAL_BASED_OUTPATIENT_CLINIC_OR_DEPARTMENT_OTHER): Payer: Self-pay

## 2024-02-27 ENCOUNTER — Inpatient Hospital Stay

## 2024-02-27 ENCOUNTER — Other Ambulatory Visit (HOSPITAL_BASED_OUTPATIENT_CLINIC_OR_DEPARTMENT_OTHER): Payer: Self-pay

## 2024-02-27 VITALS — BP 135/67 | HR 61 | Temp 98.2°F | Resp 18

## 2024-02-27 DIAGNOSIS — E538 Deficiency of other specified B group vitamins: Secondary | ICD-10-CM

## 2024-02-27 MED ORDER — CYANOCOBALAMIN 1000 MCG/ML IJ SOLN
1000.0000 ug | Freq: Once | INTRAMUSCULAR | Status: AC
  Administered 2024-02-27: 1000 ug via INTRAMUSCULAR
  Filled 2024-02-27: qty 1

## 2024-02-27 NOTE — Patient Instructions (Signed)
 Vitamin B12 Injection What is this medication? Vitamin B12 (VAHY tuh min B12) prevents and treats low vitamin B12 levels in your body. It is used in people who do not get enough vitamin B12 from their diet or when their digestive tract does not absorb enough. Vitamin B12 plays an important role in maintaining the health of your nervous system and red blood cells. This medicine may be used for other purposes; ask your health care provider or pharmacist if you have questions. COMMON BRAND NAME(S): B-12 Compliance Kit, B-12 Injection Kit, Cyomin, Dodex , LA-12, Nutri-Twelve, Physicians EZ Use B-12, Primabalt, Vitamin Deficiency Injectable System - B12 What should I tell my care team before I take this medication? They need to know if you have any of these conditions: Kidney disease Leber's disease Megaloblastic anemia An unusual or allergic reaction to cyanocobalamin , cobalt, other medications, foods, dyes, or preservatives Pregnant or trying to get pregnant Breast-feeding How should I use this medication? This medication is injected into a muscle or deeply under the skin. It is usually given in a clinic or care team's office. However, your care team may teach you how to inject yourself. Follow all instructions. Talk to your care team about the use of this medication in children. Special care may be needed. Overdosage: If you think you have taken too much of this medicine contact a poison control center or emergency room at once. NOTE: This medicine is only for you. Do not share this medicine with others. What if I miss a dose? If you are given your dose at a clinic or care team's office, call to reschedule your appointment. If you give your own injections, and you miss a dose, take it as soon as you can. If it is almost time for your next dose, take only that dose. Do not take double or extra doses. What may interact with this medication? Alcohol Colchicine This list may not describe all possible  interactions. Give your health care provider a list of all the medicines, herbs, non-prescription drugs, or dietary supplements you use. Also tell them if you smoke, drink alcohol, or use illegal drugs. Some items may interact with your medicine. What should I watch for while using this medication? Visit your care team regularly. You may need blood work done while you are taking this medication. You may need to follow a special diet. Talk to your care team. Limit your alcohol intake and avoid smoking to get the best benefit. What side effects may I notice from receiving this medication? Side effects that you should report to your care team as soon as possible: Allergic reactions--skin rash, itching, hives, swelling of the face, lips, tongue, or throat Swelling of the ankles, hands, or feet Trouble breathing Side effects that usually do not require medical attention (report to your care team if they continue or are bothersome): Diarrhea This list may not describe all possible side effects. Call your doctor for medical advice about side effects. You may report side effects to FDA at 1-800-FDA-1088. Where should I keep my medication? Keep out of the reach of children. Store at room temperature between 15 and 30 degrees C (59 and 85 degrees F). Protect from light. Throw away any unused medication after the expiration date. NOTE: This sheet is a summary. It may not cover all possible information. If you have questions about this medicine, talk to your doctor, pharmacist, or health care provider.  2024 Elsevier/Gold Standard (2020-10-28 00:00:00)

## 2024-03-02 ENCOUNTER — Other Ambulatory Visit (HOSPITAL_BASED_OUTPATIENT_CLINIC_OR_DEPARTMENT_OTHER): Payer: Self-pay

## 2024-03-02 MED ORDER — ESCITALOPRAM OXALATE 5 MG PO TABS
5.0000 mg | ORAL_TABLET | Freq: Every day | ORAL | 0 refills | Status: AC
  Filled 2024-03-02: qty 90, 90d supply, fill #0

## 2024-03-02 MED ORDER — VITAMIN D (ERGOCALCIFEROL) 1.25 MG (50000 UNIT) PO CAPS
50000.0000 [IU] | ORAL_CAPSULE | ORAL | 0 refills | Status: AC
  Filled 2024-03-02: qty 12, 84d supply, fill #0

## 2024-03-05 ENCOUNTER — Encounter: Payer: Self-pay | Admitting: Hematology and Oncology

## 2024-03-06 DIAGNOSIS — R002 Palpitations: Secondary | ICD-10-CM | POA: Diagnosis not present

## 2024-03-07 NOTE — Telephone Encounter (Signed)
 Patient read Mychart message regarding monitor results on 03/06/24.  Alan, RN

## 2024-03-16 ENCOUNTER — Encounter: Payer: Self-pay | Admitting: Hematology and Oncology

## 2024-03-19 ENCOUNTER — Ambulatory Visit: Admitting: Podiatry

## 2024-03-19 DIAGNOSIS — M79674 Pain in right toe(s): Secondary | ICD-10-CM

## 2024-03-19 DIAGNOSIS — B351 Tinea unguium: Secondary | ICD-10-CM

## 2024-03-19 DIAGNOSIS — M79675 Pain in left toe(s): Secondary | ICD-10-CM | POA: Diagnosis not present

## 2024-03-19 NOTE — Progress Notes (Unsigned)
 Nails x 10.

## 2024-03-26 ENCOUNTER — Inpatient Hospital Stay: Payer: Self-pay

## 2024-03-29 ENCOUNTER — Inpatient Hospital Stay: Payer: Self-pay | Attending: Oncology

## 2024-03-29 ENCOUNTER — Encounter: Payer: Self-pay | Admitting: Hematology and Oncology

## 2024-03-29 VITALS — BP 135/59 | HR 69 | Temp 97.9°F | Resp 18

## 2024-03-29 DIAGNOSIS — E538 Deficiency of other specified B group vitamins: Secondary | ICD-10-CM

## 2024-03-29 MED ORDER — CYANOCOBALAMIN 1000 MCG/ML IJ SOLN
1000.0000 ug | Freq: Once | INTRAMUSCULAR | Status: AC
  Administered 2024-03-29: 1000 ug via INTRAMUSCULAR
  Filled 2024-03-29: qty 1

## 2024-03-29 NOTE — Patient Instructions (Signed)
 Vitamin B12 Deficiency Vitamin B12 deficiency occurs when the body does not have enough of this important vitamin. The body needs this vitamin: To make red blood cells. To make DNA. This is the genetic material inside cells. To help the nerves work properly so they can carry messages from the brain to the body. Vitamin B12 deficiency can cause health problems, such as not having enough red blood cells in the blood (anemia). This can lead to nerve damage if untreated. What are the causes? This condition may be caused by: Not eating enough foods that contain vitamin B12. Not having enough stomach acid and digestive fluids to properly absorb vitamin B12 from the food that you eat. Having certain diseases that make it hard to absorb vitamin B12. These diseases include Crohn's disease, chronic pancreatitis, and cystic fibrosis. An autoimmune disorder in which the body does not make enough of a protein (intrinsic factor) within the stomach, resulting in not enough absorption of vitamin B12. Having a surgery in which part of the stomach or small intestine is removed. Taking certain medicines that make it hard for the body to absorb vitamin B12. These include: Heartburn medicines, such as antacids and proton pump inhibitors. Some medicines that are used to treat diabetes. What increases the risk? The following factors may make you more likely to develop a vitamin B12 deficiency: Being an older adult. Eating a vegetarian or vegan diet that does not include any foods that come from animals. Eating a poor diet while you are pregnant. Taking certain medicines. Having alcoholism. What are the signs or symptoms? In some cases, there are no symptoms of this condition. If the condition leads to anemia or nerve damage, various symptoms may occur, such as: Weakness. Tiredness (fatigue). Loss of appetite. Numbness or tingling in your hands and feet. Redness and burning of the tongue. Depression,  confusion, or memory problems. Trouble walking. If anemia is severe, symptoms can include: Shortness of breath. Dizziness. Rapid heart rate. How is this diagnosed? This condition may be diagnosed with a blood test to measure the level of vitamin B12 in your blood. You may also have other tests, including: A group of tests that measure certain characteristics of blood cells (complete blood count, CBC). A blood test to measure intrinsic factor. A procedure where a thin tube with a camera on the end is used to look into your stomach or intestines (endoscopy). Other tests may be needed to discover the cause of the deficiency. How is this treated? Treatment for this condition depends on the cause. This condition may be treated by: Changing your eating and drinking habits, such as: Eating more foods that contain vitamin B12. Drinking less alcohol or no alcohol. Getting vitamin B12 injections. Taking vitamin B12 supplements by mouth (orally). Your health care provider will tell you which dose is best for you. Follow these instructions at home: Eating and drinking  Include foods in your diet that come from animals and contain a lot of vitamin B12. These include: Meats and poultry. This includes beef, pork, chicken, [place], and organ meats, such as liver. Seafood. This includes clams, rainbow trout, salmon, tuna, and haddock. Eggs. Dairy foods such as milk, yogurt, and cheese. Eat foods that have vitamin B12 added to them (are fortified), such as ready-to-eat breakfast cereals. Check the label on the package to see if a food is fortified. The items listed above may not be a complete list of foods and beverages you can eat and drink. Contact a dietitian for  more information. Alcohol use Do not drink alcohol if: Your health care provider tells you not to drink. You are pregnant, may be pregnant, or are planning to become pregnant. If you drink alcohol: Limit how much you have to: 0-1 drink a  day for women. 0-2 drinks a day for men. Know how much alcohol is in your drink. In the [place] one drink equals one 12 oz bottle of beer (355 mL), one 5 oz glass of wine (148 mL), or one 1 oz glass of hard liquor (44 mL). General instructions Get vitamin B12 injections if told to by your health care provider. Take supplements only as told by your health care provider. Follow the directions carefully. Keep all follow-up visits. This is important. Contact a health care provider if: Your symptoms come back. Your symptoms get worse or do not improve with treatment. Get help right away: You develop shortness of breath. You have a rapid heart rate. You have chest pain. You become dizzy or you faint. These symptoms may be an emergency. Get help right away. Call 911. Do not wait to see if the symptoms will go away. Do not drive yourself to the hospital. Summary Vitamin B12 deficiency occurs when the body does not have enough of this important vitamin. Common causes include not eating enough foods that contain vitamin B12, not being able to absorb vitamin B12 from the food that you eat, having a surgery in which part of the stomach or small intestine is removed, or taking certain medicines. Eat foods that have vitamin B12 in them. Treatment may include making a change in the way you eat and drink, getting vitamin B12 injections, or taking vitamin B12 supplements. This information is not intended to replace advice given to you by your health care provider. Make sure you discuss any questions you have with your health care provider. Document Revised: 10/10/2020 Document Reviewed: 10/10/2020 Elsevier Patient Education  2024 [employer].

## 2024-04-23 ENCOUNTER — Inpatient Hospital Stay

## 2024-05-02 ENCOUNTER — Ambulatory Visit: Admitting: Cardiology

## 2024-05-21 ENCOUNTER — Inpatient Hospital Stay

## 2024-06-06 ENCOUNTER — Ambulatory Visit: Admitting: Oncology

## 2024-06-06 ENCOUNTER — Other Ambulatory Visit

## 2024-06-18 ENCOUNTER — Ambulatory Visit: Admitting: Podiatry
